# Patient Record
Sex: Female | Born: 1975 | Race: White | Hispanic: No | Marital: Married | Smoking: Former smoker
Health system: Southern US, Community
[De-identification: ages and names within clinical notes are randomized; demographics above are authoritative.]

## PROBLEM LIST (undated history)

## (undated) DIAGNOSIS — IMO0002 Reserved for concepts with insufficient information to code with codable children: Secondary | ICD-10-CM

## (undated) DIAGNOSIS — G43909 Migraine, unspecified, not intractable, without status migrainosus: Secondary | ICD-10-CM

## (undated) DIAGNOSIS — R51 Headache: Secondary | ICD-10-CM

## (undated) DIAGNOSIS — F411 Generalized anxiety disorder: Secondary | ICD-10-CM

## (undated) DIAGNOSIS — F32A Depression, unspecified: Secondary | ICD-10-CM

## (undated) DIAGNOSIS — D649 Anemia, unspecified: Secondary | ICD-10-CM

## (undated) DIAGNOSIS — R911 Solitary pulmonary nodule: Secondary | ICD-10-CM

## (undated) DIAGNOSIS — B009 Herpesviral infection, unspecified: Secondary | ICD-10-CM

## (undated) DIAGNOSIS — Z8249 Family history of ischemic heart disease and other diseases of the circulatory system: Secondary | ICD-10-CM

## (undated) DIAGNOSIS — I10 Essential (primary) hypertension: Secondary | ICD-10-CM

## (undated) DIAGNOSIS — R87619 Unspecified abnormal cytological findings in specimens from cervix uteri: Secondary | ICD-10-CM

## (undated) DIAGNOSIS — G4730 Sleep apnea, unspecified: Secondary | ICD-10-CM

## (undated) DIAGNOSIS — Z30013 Encounter for initial prescription of injectable contraceptive: Secondary | ICD-10-CM

## (undated) DIAGNOSIS — N946 Dysmenorrhea, unspecified: Secondary | ICD-10-CM

## (undated) DIAGNOSIS — R0609 Other forms of dyspnea: Secondary | ICD-10-CM

## (undated) DIAGNOSIS — K219 Gastro-esophageal reflux disease without esophagitis: Secondary | ICD-10-CM

## (undated) DIAGNOSIS — F329 Major depressive disorder, single episode, unspecified: Secondary | ICD-10-CM

## (undated) DIAGNOSIS — F419 Anxiety disorder, unspecified: Secondary | ICD-10-CM

## (undated) DIAGNOSIS — C801 Malignant (primary) neoplasm, unspecified: Secondary | ICD-10-CM

## (undated) HISTORY — DX: Unspecified abnormal cytological findings in specimens from cervix uteri: R87.619

## (undated) HISTORY — DX: Family history of ischemic heart disease and other diseases of the circulatory system: Z82.49

## (undated) HISTORY — DX: Encounter for initial prescription of injectable contraceptive: Z30.013

## (undated) HISTORY — DX: Migraine, unspecified, not intractable, without status migrainosus: G43.909

## (undated) HISTORY — DX: Anxiety disorder, unspecified: F41.9

## (undated) HISTORY — DX: Essential (primary) hypertension: I10

## (undated) HISTORY — DX: Reserved for concepts with insufficient information to code with codable children: IMO0002

## (undated) HISTORY — DX: Headache: R51

## (undated) HISTORY — DX: Dysmenorrhea, unspecified: N94.6

## (undated) HISTORY — PX: DILATION AND CURETTAGE OF UTERUS: SHX78

## (undated) HISTORY — DX: Depression, unspecified: F32.A

## (undated) HISTORY — DX: Solitary pulmonary nodule: R91.1

## (undated) HISTORY — DX: Herpesviral infection, unspecified: B00.9

## (undated) HISTORY — DX: Generalized anxiety disorder: F41.1

## (undated) HISTORY — DX: Major depressive disorder, single episode, unspecified: F32.9

## (undated) HISTORY — DX: Sleep apnea, unspecified: G47.30

## (undated) HISTORY — DX: Gastro-esophageal reflux disease without esophagitis: K21.9

## (undated) HISTORY — PX: TUBAL LIGATION: SHX77

## (undated) HISTORY — DX: Other forms of dyspnea: R06.09

---
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
## 2005-09-15 ENCOUNTER — Ambulatory Visit: Payer: Self-pay | Admitting: Gynecology

## 2005-09-23 ENCOUNTER — Ambulatory Visit: Payer: Self-pay | Admitting: Family Medicine

## 2005-09-23 ENCOUNTER — Inpatient Hospital Stay (HOSPITAL_COMMUNITY): Admission: AD | Admit: 2005-09-23 | Discharge: 2005-09-26 | Payer: Self-pay | Admitting: Obstetrics and Gynecology

## 2005-09-26 ENCOUNTER — Inpatient Hospital Stay (HOSPITAL_COMMUNITY): Admission: AD | Admit: 2005-09-26 | Discharge: 2005-09-26 | Payer: Self-pay | Admitting: Gynecology

## 2005-09-30 ENCOUNTER — Ambulatory Visit: Payer: Self-pay | Admitting: Family Medicine

## 2005-10-01 ENCOUNTER — Inpatient Hospital Stay (HOSPITAL_COMMUNITY): Admission: AD | Admit: 2005-10-01 | Discharge: 2005-10-01 | Payer: Self-pay | Admitting: Gynecology

## 2005-10-28 ENCOUNTER — Ambulatory Visit: Payer: Self-pay | Admitting: Family Medicine

## 2005-10-29 ENCOUNTER — Ambulatory Visit: Payer: Self-pay | Admitting: Family Medicine

## 2005-11-13 ENCOUNTER — Ambulatory Visit: Payer: Self-pay | Admitting: Obstetrics & Gynecology

## 2005-12-04 ENCOUNTER — Ambulatory Visit: Payer: Self-pay | Admitting: Obstetrics & Gynecology

## 2005-12-11 ENCOUNTER — Ambulatory Visit (HOSPITAL_COMMUNITY): Admission: RE | Admit: 2005-12-11 | Discharge: 2005-12-11 | Payer: Self-pay | Admitting: Family Medicine

## 2005-12-22 ENCOUNTER — Ambulatory Visit: Payer: Self-pay | Admitting: Gynecology

## 2006-01-12 ENCOUNTER — Ambulatory Visit: Payer: Self-pay | Admitting: Gynecology

## 2006-02-09 ENCOUNTER — Ambulatory Visit: Payer: Self-pay | Admitting: Gynecology

## 2006-02-23 ENCOUNTER — Ambulatory Visit: Payer: Self-pay | Admitting: Gynecology

## 2006-03-09 ENCOUNTER — Ambulatory Visit: Payer: Self-pay | Admitting: Gynecology

## 2006-03-23 ENCOUNTER — Ambulatory Visit: Payer: Self-pay | Admitting: Gynecology

## 2006-04-06 ENCOUNTER — Ambulatory Visit: Payer: Self-pay | Admitting: Gynecology

## 2006-04-20 ENCOUNTER — Ambulatory Visit: Payer: Self-pay | Admitting: Gynecology

## 2006-04-27 ENCOUNTER — Ambulatory Visit: Payer: Self-pay | Admitting: Gynecology

## 2006-05-02 ENCOUNTER — Inpatient Hospital Stay (HOSPITAL_COMMUNITY): Admission: AD | Admit: 2006-05-02 | Discharge: 2006-05-02 | Payer: Self-pay | Admitting: Obstetrics and Gynecology

## 2006-05-02 ENCOUNTER — Ambulatory Visit: Payer: Self-pay | Admitting: *Deleted

## 2006-05-05 ENCOUNTER — Ambulatory Visit: Payer: Self-pay | Admitting: Family Medicine

## 2006-05-07 ENCOUNTER — Ambulatory Visit: Payer: Self-pay | Admitting: Gynecology

## 2006-05-07 ENCOUNTER — Inpatient Hospital Stay (HOSPITAL_COMMUNITY): Admission: AD | Admit: 2006-05-07 | Discharge: 2006-05-10 | Payer: Self-pay | Admitting: Gynecology

## 2006-05-07 ENCOUNTER — Encounter (INDEPENDENT_AMBULATORY_CARE_PROVIDER_SITE_OTHER): Payer: Self-pay | Admitting: Specialist

## 2006-05-14 ENCOUNTER — Ambulatory Visit: Payer: Self-pay | Admitting: Obstetrics & Gynecology

## 2006-06-15 ENCOUNTER — Encounter (INDEPENDENT_AMBULATORY_CARE_PROVIDER_SITE_OTHER): Payer: Self-pay | Admitting: Gynecology

## 2006-06-15 ENCOUNTER — Ambulatory Visit: Payer: Self-pay | Admitting: Gynecology

## 2007-04-01 ENCOUNTER — Ambulatory Visit: Payer: Self-pay | Admitting: Gynecology

## 2007-06-12 ENCOUNTER — Emergency Department: Payer: Self-pay | Admitting: Emergency Medicine

## 2007-07-19 ENCOUNTER — Encounter (INDEPENDENT_AMBULATORY_CARE_PROVIDER_SITE_OTHER): Payer: Self-pay | Admitting: Gynecology

## 2007-07-19 ENCOUNTER — Ambulatory Visit: Payer: Self-pay | Admitting: Gynecology

## 2007-09-17 ENCOUNTER — Ambulatory Visit: Payer: Self-pay | Admitting: Unknown Physician Specialty

## 2007-12-06 ENCOUNTER — Ambulatory Visit: Payer: Self-pay | Admitting: Unknown Physician Specialty

## 2007-12-21 ENCOUNTER — Ambulatory Visit: Payer: Self-pay | Admitting: Unknown Physician Specialty

## 2008-01-07 ENCOUNTER — Ambulatory Visit: Payer: Self-pay | Admitting: Unknown Physician Specialty

## 2008-01-13 ENCOUNTER — Ambulatory Visit: Payer: Self-pay | Admitting: Unknown Physician Specialty

## 2008-01-19 ENCOUNTER — Ambulatory Visit: Payer: Self-pay | Admitting: Unknown Physician Specialty

## 2008-06-30 HISTORY — PX: COLPOSCOPY: SHX161

## 2008-07-10 ENCOUNTER — Ambulatory Visit: Payer: Self-pay | Admitting: Obstetrics & Gynecology

## 2009-03-01 ENCOUNTER — Ambulatory Visit: Payer: Self-pay | Admitting: Obstetrics & Gynecology

## 2009-03-01 ENCOUNTER — Encounter: Payer: Self-pay | Admitting: Obstetrics & Gynecology

## 2009-03-02 ENCOUNTER — Encounter: Payer: Self-pay | Admitting: Obstetrics & Gynecology

## 2009-03-02 LAB — CONVERTED CEMR LAB
Trich, Wet Prep: NONE SEEN
Yeast Wet Prep HPF POC: NONE SEEN

## 2009-03-27 ENCOUNTER — Ambulatory Visit: Payer: Self-pay | Admitting: Obstetrics & Gynecology

## 2009-04-12 ENCOUNTER — Ambulatory Visit: Payer: Self-pay | Admitting: Psychiatry

## 2009-04-26 ENCOUNTER — Ambulatory Visit: Payer: Self-pay | Admitting: Psychiatry

## 2009-08-21 ENCOUNTER — Ambulatory Visit: Payer: Self-pay | Admitting: Obstetrics & Gynecology

## 2009-11-05 ENCOUNTER — Ambulatory Visit: Payer: Self-pay | Admitting: Obstetrics and Gynecology

## 2009-11-05 LAB — CONVERTED CEMR LAB
Free T4: 0.83 ng/dL (ref 0.80–1.80)
HCT: 32.7 % — ABNORMAL LOW (ref 36.0–46.0)
Hemoglobin: 10.3 g/dL — ABNORMAL LOW (ref 12.0–15.0)
MCHC: 31.5 g/dL (ref 30.0–36.0)
MCV: 78 fL (ref 78.0–100.0)
Platelets: 305 10*3/uL (ref 150–400)
RBC: 4.19 M/uL (ref 3.87–5.11)
RDW: 16.8 % — ABNORMAL HIGH (ref 11.5–15.5)
T3, Free: 2.6 pg/mL (ref 2.3–4.2)
TSH: 1.876 microintl units/mL (ref 0.350–4.500)
WBC: 7.6 10*3/uL (ref 4.0–10.5)

## 2010-01-07 ENCOUNTER — Ambulatory Visit: Payer: Self-pay | Admitting: Obstetrics and Gynecology

## 2010-03-28 ENCOUNTER — Ambulatory Visit: Payer: Self-pay | Admitting: Obstetrics & Gynecology

## 2010-03-28 LAB — CONVERTED CEMR LAB
Prolactin: 7.8 ng/mL
TSH: 1.872 microintl units/mL (ref 0.350–4.500)

## 2010-03-29 ENCOUNTER — Encounter: Payer: Self-pay | Admitting: Obstetrics & Gynecology

## 2010-03-29 LAB — CONVERTED CEMR LAB
Clue Cells Wet Prep HPF POC: NONE SEEN
Trich, Wet Prep: NONE SEEN
WBC, Wet Prep HPF POC: NONE SEEN

## 2010-11-12 NOTE — Assessment & Plan Note (Signed)
NAME:  BETTA, BALLA NO.:  [id number]   MEDICAL RECORD NO.:  [id number]          PATIENT TYPE:  POB   LOCATION:  [facility] at [facility]         FACILITY:  [facility]   PHYSICIAN:  Scheryl Darter, MD       DATE OF BIRTH:  September 03, 1975   DATE OF SERVICE:                                  CLINIC NOTE   The patient returns today to discuss her depression medications.  The  patient has been treated for depression for years.  She currently sees a  counselor, but she was unable to initiate psychiatric therapy due to  problems with payment.  Dr. Marice Potter had changed her to Wellbutrin in  September.  She did not do well with Wellbutrin and she called in  October and prescription for Zoloft was given.  She is now on Zoloft 100  mg p.o. daily.  She still has problems with anxiety and had remained a  bit mild panic attack recently.  At that time, she took a friend's  Klonopin which helped.  She does have some benefit from the Zoloft,  however.  So, she feels about the same on Zoloft as she did when she was  taking Celexa.  She has trouble falling asleep.  She is going to try to  establish psychiatric care.  She has not been seeing her counselor  recently because of 30 dollar co-pay for each visit.  I asked her to  reestablish that relationship.  She cannot try to find a psychiatrist in  [place].  I recommend that we try a second agent for now and she will  continue with the Zoloft.  I gave her a prescription for amitriptyline  25 mg to be taken at bedtime.  She will return in 3 or 4 weeks to review  her progress on that medication.      Scheryl Darter, MD     JA/MEDQ  D:  08/21/2009  T:  08/22/2009  Job:  [id number]

## 2010-11-12 NOTE — Assessment & Plan Note (Signed)
NAME:  Catherine Gilbert, Catherine Gilbert NO.:  [id number]   MEDICAL RECORD NO.:  [id number]          PATIENT TYPE:  POB   LOCATION:  [facility] at [facility]         FACILITY:  [facility]   PHYSICIAN:  Allie Bossier, MD        DATE OF BIRTH:  11-30-1975   DATE OF SERVICE:                                  CLINIC NOTE   Catherine Gilbert is a 35 year old married white, gravida 5, para 3, abortus 2.  She  has children ages 27, 5 and 11.  She has 3 complaints today.  One, she  would like to change her Celexa to something more energizing.  She says  she has tried Prozac in the past and did not do well with that.  She  does say that she used Wellbutrin and Effexor in the past with good  results, and she would like to try the Wellbutrin now so that she can  also try to quit smoking.  Her other complaint is that of pain on the  first day of her menses.  She says that sometimes it is bad enough to  cause her to vomit and her third complaint is that of a vaginal  discharge and she would like to be checked for GC, Chlamydia and a wet  prep.   PAST MEDICAL HISTORY:  She has herpes, depression, hypertension and she  is a smoker.  For her hypertension, she sees Dr. Dossie Arbour.   PAST SURGICAL HISTORY:  A D and C for an elective AB at about 35 years  of age, a tubal ligation and 3 C-sections.   SOCIAL HISTORY:  She smokes a pack a day for the last 18 years.  She  denies alcohol or drug use.   ALLERGIES:  No known drug allergies.  No latex allergies.   FAMILY HISTORY:  Positive for postmenopausal breast cancer in a maternal  grandmother, but no GYN and colon cancers.   REVIEW OF SYSTEMS:  She is married.  Her last Pap smear was done in  January 2009, it was normal.  The remainder review of systems questions  are negative.   MEDICATIONS:  1. Valtrex as necessary.  2. Prevacid twice a day as necessary.  3. Atenolol 100 mg daily.  4. Celexa 40 mg daily.   PHYSICAL EXAMINATION:  VITAL SIGNS:  Weight 149, height 5  feet 5 inches,  blood pressure 151/106, pulse 74.  HEENT:  Normal.  HEART:  Regular rate and rhythm.  LUNGS:  Clear to auscultation bilaterally.  BREASTS:  Normal.  ABDOMEN:  No hepatosplenomegaly.  PELVIC:  External genitalia, she does have a small HSV lesions on the  right labia minora.  Cervix, nulliparous.  Normal discharge.  Wet prep  and GC and Chlamydia cultures were obtained.  Bimanual exam reveals a  small retroverted, minimally mobile, nontender uterus and nontender and  nonenlarged adnexa.   ASSESSMENT AND PLAN:  1. Annual exam, check the Pap smear.  Recommend self-breast and self-      vulvar exams monthly.  2. Hypertension.  I have recommended that she goes and see Dr.      Dossie Arbour as soon as possible.  We will make a referral.  3. The patient desire for cultures and wet prep, this were done.  4. Pain with her beginning of onset.  We will schedule an ultrasound      with the beginning of her next period, at which time she expects to      be experiencing her pain.  5. Antidepressant medicine.  We will change her to Wellbutrin 150 mg      b.i.d.  Hopefully, she will be able to quit smoking with this      regimen and also she will discontinue the Celexa after she start      the Wellbutrin.  I will have her back for followup on this in about      4 weeks.      Allie Bossier, MD     MCD/MEDQ  D:  03/01/2009  T:  03/02/2009  Job:  [id number]

## 2010-11-12 NOTE — Assessment & Plan Note (Signed)
NAME:  Catherine Gilbert, Catherine Gilbert NO.:  [id number]   MEDICAL RECORD NO.:  [id number]          PATIENT TYPE:  POB   LOCATION:  [facility]         FACILITY:  [facility]   PHYSICIAN:  Argentina Donovan, MD        DATE OF BIRTH:  [birth year]   DATE OF SERVICE:  11/05/2009                                  CLINIC NOTE   The patient is a 35 year old white female, gravida 5, para 3-0-2-3, who  has been recently placed on antidepressive medications.  She was on  Zoloft which seemed to be helping in a 100 mg a day, but she was still  having problems sleeping and still had a little bit of anxiety.  She was  placed 2 months ago on amitriptyline in addition to the Zoloft.  It has  helped to sleep a little bit, but she still takes 45 minutes to fall  asleep and that seems to be her biggest problem, but since then she put  on 14 pounds.  In looking at her past history, her weight has been very  stable for a long period of time, so my feeling is probably that weight  gain is secondary to the amitriptyline.  We are going to stop that and  just give her another with 5 mg of Ambien to take half hour before she  goes to bed at night.  As when she is asleep, it sounds as if she does  very well in her sleep process.  In addition to this, I am going to also  get a TSH and evaluate the thyroid, although I think that the weight  gain probably is more explained by her medication not anything else.  She has developed numbness in her middle and ring finger on her right  hand, but no pain.  No sign of radiculopathy that can be noted other  than that, so I am going to get her.  This is aggravated when she drives  a car, will have her get a neurology consult.  We will also going to  have her make an appointment at the [facility], as I think  that she probably needs some psychotherapy and they were incapable of  giving her here.  The problem with direct referral to a psychiatrist  here has been the  co-pay or the upfront money that was demanded even  though she has fairly good insurance.   IMPRESSION:  Depression, slight sleep disorder, and recent weight gain.  I will have her come back in a couple months just to see how well she is  doing after she sees the consults.           ______________________________  Argentina Donovan, MD     PR/MEDQ  D:  11/05/2009  T:  11/06/2009  Job:  [id number]

## 2010-11-15 NOTE — Discharge Summary (Signed)
NAME:  Catherine Gilbert, Catherine Gilbert                ACCOUNT NO.:  [id number]   MEDICAL RECORD NO.:  [id number]          PATIENT TYPE:  INP   LOCATION:  9305                          FACILITY:  [facility]   PHYSICIAN:  Angie B. Merlene Morse, MD  DATE OF BIRTH:  1976/06/14   DATE OF ADMISSION:  09/23/2005  DATE OF DISCHARGE:  09/26/2005                                 DISCHARGE SUMMARY   ADMISSION DIAGNOSES:  1.  Nausea, vomiting and diarrhea.  2.  Intrauterine pregnancy at 6-5/7 weeks.  3.  Dehydration.  4.  Elevated white blood cell count.   DISCHARGE DIAGNOSES:  1.  Nausea, vomiting and diarrhea.  2.  Intrauterine pregnancy at 6-5/7 weeks.  3.  Dehydration.  4.  Hyperglycemia.  5.  Anxiety.  6.  Hypertension.  The patient's blood pressure remained stable throughout      her hospital stay.  She was not started on any hypertensive medications.   HISTORY OF PRESENT ILLNESS:  The patient is a 35 year old, G4, P2-0-1-2 at 7  weeks approximate gestational age with a headache 2 weeks ago with fever x1  day with diarrhea, nausea and vomiting since then since a total of 2 weeks.  Her last p.o. intake was the day before admission.  She states that she had  hyperemesis with other two children and she does have a sick contact of her  mother.   MEDICATIONS:  1.  Atenolol.  2.  Hydrochlorothiazide.  3.  Effexor.  4.  Xanax, stopped once she found out she was pregnant 1-1/2 to 2 weeks ago.   PAST OBSTETRICAL HISTORY:  Scheduled C-section x2.   PAST GYNECOLOGICAL HISTORY:  Last Pap smear in August 2006.   PAST MEDICAL HISTORY:  1.  Anxiety.  2.  Migraine headaches.  3.  Hypertension.   PAST SURGICAL HISTORY:  C-section.   SOCIAL HISTORY:  Smokes one pack per day.  No ethanol or drugs.  Married and  lives with husband.   PHYSICAL EXAMINATION:  VITAL SIGNS:  Stable.  GENERAL:  Ill-appearing, white female in no acute distress, vomiting.  CHEST:  Clear to auscultation bilaterally.  ABDOMEN:  Soft,  nontender.  Positive bowel sounds.   HOSPITAL COURSE:  The patient was admitted and she was given IV fluid  hydration.  For her nausea and vomiting, she was treated with Zofran,  Phenergan and Reglan.  She was able to tolerate foods on the day before and  the day of discharge and felt like she was feeling better.  She was noted to  have hyperglycemia on her initial lab work when she came in.  She was  educated on a diabetic diet.  She had blood sugars drawn while in the  hospital 2 hours postprandial as well as fasting.  Her blood sugars were  within normal limits for a diabetic.  There were a couple of abnormal values  which the patient could explain secondary to drinking soda or eating a  sugary meal just before the blood sugar was drawn.   LABORATORY DATA AND X-RAY FINDINGS:  Urine culture with no growth.  Urine  drug screen with positive THC.  CBC with hemoglobin 11.3, hematocrit 32.0,  white count 12.1 and platelets 258 on March 28.  On March 27, her white  count was 18.0, hemoglobin 12.8, hematocrit 36.3, platelets 317 with a  normal differential.  UA on March 27, showed 500 glucose, otherwise negative  urine with specific gravity 1.020.  CMP within normal limits except for a  glucose of 192 and Alk phos of 38.  Hemoglobin A1c was 5.7.  H. pylori less  than 0.4.  TSH was 0.354.  FT3 2.9, FT4 1.30.  Vitamin B12 793.   Ultrasound done on March 27, showed crown rump length of 0.82 consistent  with 6-5/7 week yolk sac and decidual reaction having normal appearance.   DISPOSITION:  Discharged to home.   DIET:  Diabetic diet.   DISCHARGE MEDICATIONS:  1.  Zofran 8 mg p.o. t.i.d., #90.  2.  Zofran 8 mg sublingual t.i.d. p.r.n., #30.  3.  Phenergan 12.5 one to two p.o. q.4-6h., #30.  4.  Prenatal vitamins.  5.  Phenergan suppositories 25 mg one p.o. PR q.4-6h., #10.  6.  Continue Effexor.   SPECIAL INSTRUCTIONS:  The patient will need a 24-hour urine and baseline  PIH labs because  of her chronic hypertension as an outpatient.  She will  also need an early Glucola.  These tests were not done because of this  patient being acutely ill.  It was felt that she could better tolerate them  as an outpatient.           ______________________________  August Saucer. Merlene Morse, MD     ABC/MEDQ  D:  09/26/2005  T:  09/26/2005  Job:  [id number]   cc:   [facility]

## 2010-11-15 NOTE — Op Note (Signed)
NAME:  Catherine Gilbert, Catherine Gilbert NO.:  [id number]   MEDICAL RECORD NO.:  [id number]          PATIENT TYPE:  INP   LOCATION:                                FACILITY:  [facility]   PHYSICIAN:  Ginger Carne, MD  DATE OF BIRTH:  1975-12-26   DATE OF PROCEDURE:  05/07/2006  DATE OF DISCHARGE:                                 OPERATIVE REPORT   PREOPERATIVE DIAGNOSIS:  Repeat cesarean section, term pregnancy, and  request for sterilization.   POSTOPERATIVE DIAGNOSIS:  Repeat cesarean section, term pregnancy, request  for sterilization, term viable delivery of female infant.   PROCEDURE:  Repeat low transverse cesarean section and Pomeroy bilateral  tubal ligation.   SURGEON:  Ginger Carne, M.D.   ASSISTANTMichele Mcalpine D. Okey Dupre, M.D.   ANESTHESIA:  Spinal.   SPECIMEN:  Right and left portions of tube sent to pathology and cord  bloods.   ESTIMATED BLOOD LOSS:  500 mL.   COMPLICATIONS:  None immediate.   OPERATIVE FINDINGS:  Term infant female delivered in vertex presentation.  Apgars and weight per delivery room record, no gross abnormalities.  Baby  cried spontaneously at delivery.  Amniotic fluid was clear.  Placenta  complete, three-vessel cord, central insertion.  Uterus, tubes and ovaries  showed normal decidual changes of pregnancy.   OPERATIVE PROCEDURE:  The patient was prepped and draped in the usual  fashion and placed in the left lateral supine position.  Betadine solution  was used for antiseptic and the patient was catheterized prior to the  procedure.  After adequate spinal analgesia, a Pfannenstiel incision was  made and the abdomen opened.  The lower uterine segment was incised  transversely, the baby delivered, cord clamped and cut, and the infant given  to the pediatric staff after bulb suctioning.  The placenta was removed  manually.  The uterus inspected.  Closure of the uterine musculature in one  layer of 0 Vicryl running interlocking suture.   Bleeding points  hemostatically checked.  Blood clots removed.   A Pomeroy bilateral tubal ligation was performed by grasping either tube at  the isthmus ampullary junction, about 3 cm of tube were incorporated in two  separate 2-0 plain catgut suture ties, the tubes then cut above said knots,  tips cauterized, no active bleeding noted.   The uterus was returned to the abdomen.  Blood clots removed.  Bleeding  points hemostatically checked.  Closure of the fascia in one layer of 0  Vicryl running suture from either end to midline and 4-0 Prolene for  subcuticular closure.  Instrument and sponge counts were correct.  The  patient tolerated the procedure well and returned to the post anesthesia  recovery room in excellent condition.      Ginger Carne, MD  Electronically Signed     SHB/MEDQ  D:  05/07/2006  T:  05/07/2006  Job:  [id number]

## 2010-11-15 NOTE — Discharge Summary (Signed)
Catherine Gilbert                ACCOUNT NO.:  [id number]   MEDICAL RECORD NO.:  [id number]          PATIENT TYPE:  INP   LOCATION:                                FACILITY:  [facility]   PHYSICIAN:  Ginger Carne, MD  DATE OF BIRTH:  09-23-1975   DATE OF ADMISSION:  05/05/2006  DATE OF DISCHARGE:                                 DISCHARGE SUMMARY   REASON FOR HOSPITALIZATION:  Repeat Caesarian section.  Request for  permanent sterilization.   IN HOSPITAL PROCEDURES:  Repeat low transverse Caesarian section, Pomeroy  bilateral tubal ligation.   FINAL DIAGNOSES:  Repeat low transverse Caesarian section, Pomeroy bilateral  tubal ligation.   HOSPITAL COURSE:  This is a 35 year old multiparous Caucasian female who  underwent a term pregnancy, repeat low transverse Caesarian section and  Pomeroy bilateral tubal ligation.  The patient's estimated date of  confinement was 05/14/2006.  She was dated by an 18-week scan and last  menstrual period.  She has a history of chronic hypertension, herpes simplex  virus without active lesions during this pregnancy.  The patient's  postoperative course was uneventful.  She was afebrile, voided well.  Incision was dry, scant flow and calves without tenderness.  Postoperative  hemoglobin was 9.6, hematocrit 28.   The patient was discharged with routine instructions including continuing  preoperative medications Zoloft 50 mg every day and labetalol 100 mg twice a  day.  The patient was also prescribed Percocet 5/325 1-2 every 4-6 hours as  needed for pain.  Routine postoperative instructions including contacting  the office for temperature elevation about 100.4 degrees Fahrenheit,  increasing incisional pain, drainage, redness, abdominal pain, genitourinary  or gastrointestinal symptomatology or any other concerns were discussed with  the patient to contact the office.  Heavy vaginal bleeding was also an  indication.  She will return in 3-4 days for her  subcuticular suture to be  removed and in four weeks to have her postoperative visit and reassessment  of her hypertension.      Ginger Carne, MD  Electronically Signed     SHB/MEDQ  D:  05/10/2006  T:  05/10/2006  Job:  [id number]

## 2011-04-16 ENCOUNTER — Ambulatory Visit: Payer: Self-pay | Admitting: Family Medicine

## 2011-04-23 ENCOUNTER — Encounter: Payer: Self-pay | Admitting: Family Medicine

## 2011-04-23 ENCOUNTER — Ambulatory Visit (INDEPENDENT_AMBULATORY_CARE_PROVIDER_SITE_OTHER): Payer: BC Managed Care – PPO | Admitting: Family Medicine

## 2011-04-23 ENCOUNTER — Other Ambulatory Visit: Payer: Self-pay | Admitting: Family Medicine

## 2011-04-23 DIAGNOSIS — F419 Anxiety disorder, unspecified: Secondary | ICD-10-CM | POA: Insufficient documentation

## 2011-04-23 DIAGNOSIS — F329 Major depressive disorder, single episode, unspecified: Secondary | ICD-10-CM

## 2011-04-23 DIAGNOSIS — Z720 Tobacco use: Secondary | ICD-10-CM | POA: Insufficient documentation

## 2011-04-23 DIAGNOSIS — Z1272 Encounter for screening for malignant neoplasm of vagina: Secondary | ICD-10-CM

## 2011-04-23 DIAGNOSIS — I10 Essential (primary) hypertension: Secondary | ICD-10-CM

## 2011-04-23 DIAGNOSIS — F32A Depression, unspecified: Secondary | ICD-10-CM

## 2011-04-23 DIAGNOSIS — F411 Generalized anxiety disorder: Secondary | ICD-10-CM

## 2011-04-23 DIAGNOSIS — N920 Excessive and frequent menstruation with regular cycle: Secondary | ICD-10-CM

## 2011-04-23 DIAGNOSIS — F17200 Nicotine dependence, unspecified, uncomplicated: Secondary | ICD-10-CM

## 2011-04-23 DIAGNOSIS — Z01419 Encounter for gynecological examination (general) (routine) without abnormal findings: Secondary | ICD-10-CM

## 2011-04-23 DIAGNOSIS — R51 Headache: Secondary | ICD-10-CM

## 2011-04-23 HISTORY — DX: Generalized anxiety disorder: F41.1

## 2011-04-23 LAB — CBC
HCT: 37.7 % (ref 36.0–46.0)
Hemoglobin: 12.5 g/dL (ref 12.0–15.0)
MCH: 30.3 pg (ref 26.0–34.0)
MCHC: 33.2 g/dL (ref 30.0–36.0)
MCV: 91.3 fL (ref 78.0–100.0)
Platelets: 221 10*3/uL (ref 150–400)
RBC: 4.13 MIL/uL (ref 3.87–5.11)
RDW: 13.5 % (ref 11.5–15.5)
WBC: 9.4 10*3/uL (ref 4.0–10.5)

## 2011-04-23 NOTE — Patient Instructions (Addendum)
Smoking Cessation This document explains the best ways for you to quit smoking and new treatments to help. It lists new medicines that can double or triple your chances of quitting and quitting for good. It also considers ways to avoid relapses and concerns you may have about quitting, including weight gain. NICOTINE: A POWERFUL ADDICTION If you have tried to quit smoking, you know how hard it can be. It is hard because nicotine is a very addictive drug. For some people, it can be as addictive as heroin or cocaine. Usually, people make 2 or 3 tries, or more, before finally being able to quit. Each time you try to quit, you can learn about what helps and what hurts. Quitting takes hard work and a lot of effort, but you can quit smoking. QUITTING SMOKING IS ONE OF THE MOST IMPORTANT THINGS YOU WILL EVER DO.  You will live longer, feel better, and live better.   The impact on your body of quitting smoking is felt almost immediately:   Within 20 minutes, blood pressure decreases. Pulse returns to its normal level.   After 8 hours, carbon monoxide levels in the blood return to normal. Oxygen level increases.   After 24 hours, chance of heart attack starts to decrease. Breath, hair, and body stop smelling like smoke.   After 48 hours, damaged nerve endings begin to recover. Sense of taste and smell improve.   After 72 hours, the body is virtually free of nicotine. Bronchial tubes relax and breathing becomes easier.   After 2 to 12 weeks, lungs can hold more air. Exercise becomes easier and circulation improves.   Quitting will reduce your risk of having a heart attack, stroke, cancer, or lung disease:   After 1 year, the risk of coronary heart disease is cut in half.   After 5 years, the risk of stroke falls to the same as a nonsmoker.   After 10 years, the risk of lung cancer is cut in half and the risk of other cancers decreases significantly.   After 15 years, the risk of coronary heart  disease drops, usually to the level of a nonsmoker.   If you are pregnant, quitting smoking will improve your chances of having a healthy baby.   The people you live with, especially your children, will be healthier.   You will have extra money to spend on things other than cigarettes.  FIVE KEYS TO QUITTING Studies have shown that these 5 steps will help you quit smoking and quit for good. You have the best chances of quitting if you use them together: 1. Get ready.  2. Get support and encouragement.  3. Learn new skills and behaviors.  4. Get medicine to reduce your nicotine addiction and use it correctly.  5. Be prepared for relapse or difficult situations. Be determined to continue trying to quit, even if you do not succeed at first.  1. GET READY  Set a quit date.   Change your environment.   Get rid of ALL cigarettes, ashtrays, matches, and lighters in your home, car, and place of work.   Do not let people smoke in your home.   Review your past attempts to quit. Think about what worked and what did not.   Once you quit, do not smoke. NOT EVEN A PUFF!  2. GET SUPPORT AND ENCOURAGEMENT Studies have shown that you have a better chance of being successful if you have help. You can get support in many ways.  Tell   your family, friends, and coworkers that you are going to quit and need their support. Ask them not to smoke around you.   Talk to your caregivers (doctor, dentist, nurse, pharmacist, psychologist, and/or smoking counselor).   Get individual, group, or telephone counseling and support. The more counseling you have, the better your chances are of quitting. Programs are available at local hospitals and health centers. Call your local health department for information about programs in your area.   Spiritual beliefs and practices may help some smokers quit.   Quit meters are small computer programs online or downloadable that keep track of quit statistics, such as amount  of "quit-time," cigarettes not smoked, and money saved.   Many smokers find one or more of the many self-help books available useful in helping them quit and stay off tobacco.  3. LEARN NEW SKILLS AND BEHAVIORS  Try to distract yourself from urges to smoke. Talk to someone, go for a walk, or occupy your time with a task.   When you first try to quit, change your routine. Take a different route to work. Drink tea instead of coffee. Eat breakfast in a different place.   Do something to reduce your stress. Take a hot bath, exercise, or read a book.   Plan something enjoyable to do every day. Reward yourself for not smoking.   Explore interactive web-based programs that specialize in helping you quit.  4. GET MEDICINE AND USE IT CORRECTLY Medicines can help you stop smoking and decrease the urge to smoke. Combining medicine with the above behavioral methods and support can quadruple your chances of successfully quitting smoking. The [place] Food and Drug Administration (FDA) has approved 7 medicines to help you quit smoking. These medicines fall into 3 categories.  Nicotine replacement therapy (delivers nicotine to your body without the negative effects and risks of smoking):   Nicotine gum: Available over-the-counter.   Nicotine lozenges: Available over-the-counter.   Nicotine inhaler: Available by prescription.   Nicotine nasal spray: Available by prescription.   Nicotine skin patches (transdermal): Available by prescription and over-the-counter.   Antidepressant medicine (helps people abstain from smoking, but how this works is unknown):   Bupropion sustained-release (SR) tablets: Available by prescription.   Nicotinic receptor partial agonist (simulates the effect of nicotine in your brain):   Varenicline tartrate tablets: Available by prescription.   Ask your caregiver for advice about which medicines to use and how to use them. Carefully read the information on the package.    Everyone who is trying to quit may benefit from using a medicine. If you are pregnant or trying to become pregnant, nursing an infant, you are under age 18, or you smoke fewer than 10 cigarettes per day, talk to your caregiver before taking any nicotine replacement medicines.   You should stop using a nicotine replacement product and call your caregiver if you experience nausea, dizziness, weakness, vomiting, fast or irregular heartbeat, mouth problems with the lozenge or gum, or redness or swelling of the skin around the patch that does not go away.   Do not use any other product containing nicotine while using a nicotine replacement product.   Talk to your caregiver before using these products if you have diabetes, heart disease, asthma, stomach ulcers, you had a recent heart attack, you have high blood pressure that is not controlled with medicine, a history of irregular heartbeat, or you have been prescribed medicine to help you quit smoking.  5. BE PREPARED FOR RELAPSE OR   DIFFICULT SITUATIONS  Most relapses occur within the first 3 months after quitting. Do not be discouraged if you start smoking again. Remember, most people try several times before they finally quit.   You may have symptoms of withdrawal because your body is used to nicotine. You may crave cigarettes, be irritable, feel very hungry, cough often, get headaches, or have difficulty concentrating.   The withdrawal symptoms are only temporary. They are strongest when you first quit, but they will go away within 10 to 14 days.  Here are some difficult situations to watch for:  Alcohol. Avoid drinking alcohol. Drinking lowers your chances of successfully quitting.   Caffeine. Try to reduce the amount of caffeine you consume. It also lowers your chances of successfully quitting.   Other smokers. Being around smoking can make you want to smoke. Avoid smokers.   Weight gain. Many smokers will gain weight when they quit, usually  less than 10 pounds. Eat a healthy diet and stay active. Do not let weight gain distract you from your main goal, quitting smoking. Some medicines that help you quit smoking may also help delay weight gain. You can always lose the weight gained after you quit.   Bad mood or depression. There are a lot of ways to improve your mood other than smoking.  If you are having problems with any of these situations, talk to your caregiver. SPECIAL SITUATIONS AND CONDITIONS Studies suggest that everyone can quit smoking. Your situation or condition can give you a special reason to quit.  Pregnant women/new mothers: By quitting, you protect your baby's health and your own.   Hospitalized patients: By quitting, you reduce health problems and help healing.   Heart attack patients: By quitting, you reduce your risk of a second heart attack.   Lung, head, and neck cancer patients: By quitting, you reduce your chance of a second cancer.   Parents of children and adolescents: By quitting, you protect your children from illnesses caused by secondhand smoke.  QUESTIONS TO THINK ABOUT Think about the following questions before you try to stop smoking. You may want to talk about your answers with your caregiver.  Why do you want to quit?   If you tried to quit in the past, what helped and what did not?   What will be the most difficult situations for you after you quit? How will you plan to handle them?   Who can help you through the tough times? Your family? Friends? Caregiver?   What pleasures do you get from smoking? What ways can you still get pleasure if you quit?  Here are some questions to ask your caregiver:  How can you help me to be successful at quitting?   What medicine do you think would be best for me and how should I take it?   What should I do if I need more help?   What is smoking withdrawal like? How can I get information on withdrawal?  Quitting takes hard work and a lot of effort,  but you can quit smoking. FOR MORE INFORMATION  Smokefree.gov (http://www.davis-sullivan.com/) provides free, accurate, evidence-based information and professional assistance to help support the immediate and long-term needs of people trying to quit smoking. Document Released: 06/10/2001 Document Revised: 02/26/2011 Document Reviewed: 04/02/2009 [facility] Patient Information 2012 [place], [place].Preventative Care for Adults, Female A healthy lifestyle and preventative care can promote health and wellness. Preventative health guidelines for women include the following key practices:  A routine yearly physical is  a good way to check with your caregiver about your health and preventative screening. It is a chance to share any concerns and updates on your health, and to receive a thorough exam.   Visit your dentist for a routine exam and preventative care every 6 months. Brush your teeth twice a day and floss once a day. Good oral hygiene prevents tooth decay and gum disease.   The frequency of eye exams is based on your age, health, family medical history, use of contact lenses, and other factors. Follow your caregiver's recommendations for frequency of eye exams.   Eat a healthy diet. Foods like vegetables, fruits, whole grains, low-fat dairy products, and lean protein foods contain the nutrients you need without too many calories. Decrease your intake of foods high in solid fats, added sugars, and salt. Eat the right amount of calories for you.Get information about a proper diet from your caregiver, if necessary.   Regular physical exercise is one of the most important things you can do for your health. Most adults should get at least 150 minutes of moderate-intensity exercise (any activity that increases your heart rate and causes you to sweat) each week. In addition, most adults need muscle-strengthening exercises on 2 or more days a week.   Maintain a healthy weight. The body mass index (BMI) is a  screening tool to identify possible weight problems. It provides an estimate of body fat based on height and weight. Your caregiver can help determine your BMI, and can help you achieve or maintain a healthy weight.For adults 20 years and older:   A BMI below 18.5 is considered underweight.   A BMI of 18.5 to 24.9 is normal.   A BMI of 25 to 29.9 is considered overweight.   A BMI of 30 and above is considered obese.   Maintain normal blood lipids and cholesterol levels by exercising and minimizing your intake of saturated fat. Eat a balanced diet with plenty of fruit and vegetables. Blood tests for lipids and cholesterol should begin at age 10 and be repeated every 5 years. If your lipid or cholesterol levels are high, you are over 50, or you are a high risk for heart disease, you may need your cholesterol levels checked more frequently.Ongoing high lipid and cholesterol levels should be treated with medicines if diet and exercise are not effective.   If you smoke, find out from your caregiver how to quit. If you do not use tobacco, do not start.   If you are pregnant, do not drink alcohol. If you are breastfeeding, be very cautious about drinking alcohol. If you are not pregnant and choose to drink alcohol, do not exceed 1 drink per day. One drink is considered to be 12 ounces (355 mL) of beer, 5 ounces (148 mL) of wine, or 1.5 ounces (44 mL) of liquor.   Avoid use of street drugs. Do not share needles with anyone. Ask for help if you need support or instructions about stopping the use of drugs.   High blood pressure causes heart disease and increases the risk of stroke. Your blood pressure should be checked at least every 1 to 2 years. Ongoing high blood pressure should be treated with medicines if weight loss and exercise are not effective.   If you are 71 to 36 years old, ask your caregiver if you should take aspirin to prevent strokes.   Diabetes screening involves taking a blood sample  to check your fasting blood sugar level. This should  be done once every 3 years, after age 60, if you are within normal weight and without risk factors for diabetes. Testing should be considered at a younger age or be carried out more frequently if you are overweight and have at least 1 risk factor for diabetes.   Breast cancer screening is essential preventative care for women. You should practice "breast self-awareness." This means understanding the normal appearance and feel of your breasts and may include breast self-examination. Any changes detected, no matter how small, should be reported to a caregiver. Women in their 62s and 30s should have a clinical breast exam (CBE) by a caregiver as part of a regular health exam every 1 to 3 years. After age 29, women should have a CBE every year. Starting at age 66, women should consider having a mammogram (breast X-ray) every year. Women who have a family history of breast cancer should talk to their caregiver about genetic screening. Women at a high risk of breast cancer should talk to their caregiver about having an MRI and a mammogram every year.   The Pap test is a screening test for cervical cancer. A Pap test can show cell changes on the cervix that might become cervical cancer if left untreated. A Pap test is a procedure in which cells are obtained and examined from the lower end of the uterus (cervix).   Women should have a Pap test starting at age 16.   Between ages 65 and 58, Pap tests should be repeated every 2 years.   Beginning at age 29, you should have a Pap test every 3 years as long as the past 3 Pap tests have been normal.   Some women have medical problems that increase the chance of getting cervical cancer. Talk to your caregiver about these problems. It is especially important to talk to your caregiver if a new problem develops soon after your last Pap test. In these cases, your caregiver may recommend more frequent screening and Pap  tests.   The above recommendations are the same for women who have or have not gotten the vaccine for human papillomavirus (HPV).   If you had a hysterectomy for a problem that was not cancer or a condition that could lead to cancer, then you no longer need Pap tests. Even if you no longer need a Pap test, a regular exam is a good idea to make sure no other problems are starting.   If you are between ages 75 and 56, and you have had normal Pap tests going back 10 years, you no longer need Pap tests. Even if you no longer need a Pap test, a regular exam is a good idea to make sure no other problems are starting.   If you have had past treatment for cervical cancer or a condition that could lead to cancer, you need Pap tests and screening for cancer for at least 20 years after your treatment.   If Pap tests have been discontinued, risk factors (such as a new sexual partner) need to be reassessed to determine if screening should be resumed.   The HPV test is an additional test that may be used for cervical cancer screening. The HPV test looks for the virus that can cause the cell changes on the cervix. The cells collected during the Pap test can be tested for HPV. The HPV test could be used to screen women aged 57 years and older, and should be used in women of any age  who have unclear Pap test results. After the age of 52, women should have HPV testing at the same frequency as a Pap test.   Colorectal cancer can be detected and often prevented. Most routine colorectal cancer screening begins at the age of 38 and continues through age 36. However, your caregiver may recommend screening at an earlier age if you have risk factors for colon cancer. On a yearly basis, your caregiver may provide home test kits to check for hidden blood in the stool. Use of a small camera at the end of a tube, to directly examine the colon (sigmoidoscopy or colonoscopy), can detect the earliest forms of colorectal cancer. Talk  to your caregiver about this at age 44, when routine screening begins. Direct examination of the colon should be repeated every 5 to 10 years through age 91, unless early forms of pre-cancerous polyps or small growths are found.   Practice safe sex. Use condoms and avoid high-risk sexual practices to reduce the spread of sexually transmitted infections (STIs). STIs include gonorrhea, chlamydia, syphilis, trichomonas, herpes, HPV, and human immunodeficiency virus (HIV). Herpes, HIV, and HPV are viral illnesses that have no cure. They can result in disability, cancer, and death. Sexually active women aged 79 and younger should be checked for Chlamydia. Older women with new or multiple partners should also be tested for Chlamydia. Testing for other STIs is recommended if you are sexually active and at increased risk.   Osteoporosis is a disease in which the bones lose minerals and strength with aging. This can result in serious bone fractures. The risk of osteoporosis can be identified using a bone density scan. Women ages 69 and over and women at risk for fractures or osteoporosis should discuss screening with their caregivers. Ask your caregiver whether you should take a calcium supplement or vitamin D to reduce the rate of osteoporosis.   Menopause can be associated with physical symptoms and risks. Hormone replacement therapy is available to decrease symptoms and risks. You should talk to your caregiver about whether hormone replacement therapy is right for you.   Use sunscreen with skin protection factor (SPF) of 30 or more. Apply sunscreen liberally and repeatedly throughout the day. You should seek shade when your shadow is shorter than you. Protect yourself by wearing long sleeves, pants, a wide-brimmed hat, and sunglasses year round, whenever you are outdoors.   Once a month, do a whole body skin exam, using a mirror to look at the skin on your back. Notify your caregiver of new moles, moles that  have irregular borders, moles that are larger than a pencil eraser, or moles that have changed in shape or color.   Stay current with required immunizations.   Influenza. You need a dose every fall (or winter). The composition of the flu vaccine changes each year, so being vaccinated once is not enough.   Pneumococcal polysaccharide. You need 1 to 2 doses if you smoke cigarettes or if you have certain chronic medical conditions. You need 1 dose at age 23 (or older) if you have never been vaccinated.   Tetanus, diphtheria, pertussis (Tdap, Td). Get 1 dose of Tdap vaccine if you are younger than age 36 years, are over 70 and have contact with an infant, are a Research scientist (physical sciences), are pregnant, or simply want to be protected from whooping cough. After that, you need a Td booster dose every 10 years. Consult your caregiver if you have not had at least 3 tetanus and diphtheria-containing shots  sometime in your life or have a deep or dirty wound.   HPV. You need this vaccine if you are a woman age 25 years or younger. The vaccine is given in 3 doses over 6 months.   Measles, mumps, rubella (MMR). You need at least 1 dose of MMR if you were born in 1957 or later. You may also need a 2nd dose.   Meningococcal. If you are age 62 to 52 years and a Orthoptist living in a residence hall, or have one of several medical conditions, you need to get vaccinated against meningococcal disease. You may also need additional booster doses.   Zoster (shingles). If you are age 14 years or older, you should get this vaccine.   Varicella (chickenpox). If you have never had chickenpox or you were vaccinated but received only 1 dose, talk to your caregiver to find out if you need this vaccine.   Hepatitis A. You need this vaccine if you have a specific risk factor for hepatitis A virus infection or you simply wish to be protected from this disease. The vaccine is usually given as 2 doses, 6 to 18 months apart.    Hepatitis B. You need this vaccine if you have a specific risk factor for hepatitis B virus infection or you simply wish to be protected from this disease. The vaccine is given in 3 doses, usually over 6 months.  Preventative Services / Frequency Ages 1 to 5  Blood pressure check.** / Every 1 to 2 years.   Lipid and cholesterol check.**/ Every 5 years beginning at age 45.   Clinical breast exam.** / Every 3 years for women in their 35s and 30s.   Pap Test.** / Every 2 years from ages 41 through 66. Every 3 years starting at age 60 years through age 64 or 15 with a history of 3 consecutive normal Pap tests.   HPV Screening.** / Every 3 years from ages 59 through ages 27 to 93 with a history of 3 consecutive normal Pap tests.   Skin self-exam. / Monthly.   Influenza immunization.** / Every year.   Pneumococcal polysaccharide immunization.** / 1 to 2 doses if you smoke cigarettes or if you have certain chronic medical conditions.   Tetanus, diphtheria, pertussis (Tdap,Td) immunization. / A one-time dose of Tdap vaccine. After that, you need a Td booster dose every 10 years.   HPV immunization. / 3 doses over 6 months, if 26 and younger.   Measles, mumps, rubella (MMR) immunization. / You need at least 1 dose of MMR if you were born in 1957 or later. You may also need a 2nd dose.   Meningococcal immunization. / 1 dose if you are age 30 to 6 years and a Orthoptist living in a residence hall, or have one of several medical conditions, you need to get vaccinated against meningococcal disease. You may also need additional booster doses.   Varicella immunization. **/ Consult your caregiver.   Hepatitis A immunization. ** / Consult your caregiver. 2 doses, 6 to 18 months apart.   Hepatitis B immunization.** / Consult your caregiver. 3 doses usually over 6 months.  Ages 67 to 20  Blood pressure check.** / Every 1 to 2 years.   Lipid and cholesterol check.**/ Every 5  years beginning at age 46.   Clinical breast exam.** / Every year after age 97.   Mammogram.** / Every year beginning at age 101 and continuing for as long as you  are in good health. Consult with your caregiver.   Pap Test.** / Every 3 years starting at age 81 years through age 34 or 65 with a history of 3 consecutive normal Pap tests.   HPV Screening.** / Every 3 years from ages 31 through ages 27 to 71 with a history of 3 consecutive normal Pap tests.   Fecal occult blood test (FOBT) of stool. / Every year beginning at age 37 and continuing until age 29. You may not have to do this test if you get colonoscopy every 10 years.   Flexible sigmoidoscopy** or colonoscopy.** / Every 5 years for a flexible sigmoidoscopy or every 10 years for a colonoscopy beginning at age 50 and continuing until age 103.   Skin self-exam. / Monthly.   Influenza immunization.** / Every year.   Pneumococcal polysaccharide immunization.** / 1 to 2 doses if you smoke cigarettes or if you have certain chronic medical conditions.   Tetanus, diphtheria, pertussis (Tdap/Td) immunization.** / A one-time dose of Tdap vaccine. After that, you need a Td booster dose every 10 years.   Measles, mumps, rubella (MMR) immunization. / You need at least 1 dose of MMR if you were born in 1957 or later. You may also need a 2nd dose.   Varicella immunization. **/ Consult your caregiver.   Meningococcal immunization.** / Consult your caregiver.     Hepatitis A immunization. ** / Consult your caregiver. 2 doses, 6 to 18 months apart.   Hepatitis B immunization.** / Consult your caregiver. 3 doses, usually over 6 months.  Ages 1 and over  Blood pressure check.** / Every 1 to 2 years.   Lipid and cholesterol check.**/ Every 5 years beginning at age 64.   Clinical breast exam.** / Every year after age 78.   Mammogram.** / Every year beginning at age 43 and continuing for as long as you are in good health. Consult with your  caregiver.   Pap Test,** / Every 3 years starting at age 70 years through age 70 or 35 with a 3 consecutive normal Pap tests. Testing can be stopped between 65 and 70 with 3 consecutive normal Pap tests and no abnormal Pap or HPV tests in the past 10 years.   HPV Screening.** / Every 3 years from ages 71 through ages 79 or 75 with a history of 3 consecutive normal Pap tests. Testing can be stopped between 65 and 70 with 3 consecutive normal Pap tests and no abnormal Pap or HPV tests in the past 10 years.   Fecal occult blood test (FOBT) of stool. / Every year beginning at age 78 and continuing until age 55. You may not have to do this test if you get colonoscopy every 10 years.   Flexible sigmoidoscopy** or colonoscopy.** / Every 5 years for a flexible sigmoidoscopy or every 10 years for a colonoscopy beginning at age 88 and continuing until age 85.   Osteoporosis screening.** / A one-time screening for women ages 10 and over and women at risk for fractures or osteoporosis.   Skin self-exam. / Monthly.   Influenza immunization.** / Every year.   Pneumococcal polysaccharide immunization.** / 1 dose at age 25 (or older) if you have never been vaccinated.   Tetanus, diphtheria, pertussis (Tdap, Td) immunization. / A one-time dose of Tdap vaccine if you are over 65 and have contact with an infant, are a Research scientist (physical sciences), or simply want to be protected from whooping cough. After that, you need a Td booster  dose every 10 years.   Varicella immunization. **/ Consult your caregiver.   Meningococcal immunization.** / Consult your caregiver.   Hepatitis A immunization. ** / Consult your caregiver. 2 doses, 6 to 18 months apart.   Hepatitis B immunization.** / Check with your caregiver. 3 doses, usually over 6 months.  ** Family history and personal history of risk and conditions may change your caregiver's recommendations. Document Released: 08/12/2001 Document Revised: 02/26/2011 Document  Reviewed: 11/11/2010 [facility] Patient Information 2012 [place], [place].Menorrhagia Dysfunctional uterine bleeding is different from a normal menstrual period. When periods are heavy or there is more bleeding than is usual for you, it is called menorrhagia. It may be caused by hormonal imbalance, or physical, metabolic, or other problems. Examination is necessary in order that your caregiver may treat treatable causes. If this is a continuing problem, a D&C may be needed. That means that the cervix (the opening of the uterus or womb) is dilated (stretched larger) and the lining of the uterus is scraped out. The tissue scraped out is then examined under a microscope by a specialist (pathologist) to make sure there is nothing of concern that needs further or more extensive treatment. HOME CARE INSTRUCTIONS   If medications were prescribed, take exactly as directed. Do not change or switch medications without consulting your caregiver.   Long term heavy bleeding may result in iron deficiency. Your caregiver may have prescribed iron pills. They help replace the iron your body lost from heavy bleeding. Take exactly as directed. Iron may cause constipation. If this becomes a problem, increase the bran, fruits, and roughage in your diet.   Do not take aspirin or medicines that contain aspirin one week before or during your menstrual period. Aspirin may make the bleeding worse.   If you need to change your sanitary pad or tampon more than once every 2 hours, stay in bed and rest as much as possible until the bleeding stops.   Eat well-balanced meals. Eat foods high in iron. Examples are leafy green vegetables, meat, liver, eggs, and whole grain breads and cereals. Do not try to lose weight until the abnormal bleeding has stopped and your blood iron level is back to normal.  SEEK MEDICAL CARE IF:   You need to change your sanitary pad or tampon more than once an hour.   You develop nausea (feeling sick to  your stomach) and vomiting, dizziness, or diarrhea while you are taking your medicine.   You have any problems that may be related to the medicine you are taking.  SEEK IMMEDIATE MEDICAL CARE IF:   You have a fever.   You develop chills.   You develop severe bleeding or start to pass blood clots.   You feel dizzy or faint.  MAKE SURE YOU:   Understand these instructions.   Will watch your condition.   Will get help right away if you are not doing well or get worse.  Document Released: 06/16/2005 Document Revised: 02/26/2011 Document Reviewed: 02/04/2008 [facility] Patient Information 2012 [place], [place].

## 2011-04-23 NOTE — Progress Notes (Signed)
  Subjective:     Catherine Gilbert is a 35 y.o. female and is here for a comprehensive physical exam. The patient reports problems - abnl bleeding--monthly but lasts x 2 wks, increasing acne.  History   Social History  . Marital Status: Married    Spouse Name: N/A    Number of Children: N/A  . Years of Education: N/A   Occupational History  . Not on file.   Social History Main Topics  . Smoking status: Current Everyday Smoker -- 20 years    Types: Cigarettes  . Smokeless tobacco: Never Used   Comment: down to 1-2 cigarettes per day  . Alcohol Use: No  . Drug Use: No  . Sexually Active: Yes    Birth Control/ Protection: Surgical   Other Topics Concern  . Not on file   Social History Narrative  . No narrative on file   Health Maintenance  Topic Date Due  . Pap Smear  05/01/1993  . Tetanus/tdap  05/01/1994  . Influenza Vaccine  03/31/2011    The following portions of the patient's history were reviewed and updated as appropriate: allergies, current medications, past family history, past medical history, past social history, past surgical history and problem list.  Review of Systems Pertinent items are noted in HPI.   Objective:    BP 161/97  Pulse 60  Ht 5\' 4"  (1.626 m)  Wt 121 lb (54.885 kg)  BMI 20.77 kg/m2  LMP 04/10/2011 General appearance: alert, cooperative and appears stated age Head: Normocephalic, without obvious abnormality, atraumatic Eyes: sclera without icterus Neck: no adenopathy, supple, symmetrical, trachea midline and thyroid not enlarged, symmetric, no tenderness/mass/nodules Lungs: clear to auscultation bilaterally Breasts: normal appearance, no masses or tenderness Heart: regular rate and rhythm, S1, S2 normal, no murmur, click, rub or gallop Abdomen: soft, non-tender; bowel sounds normal; no masses,  no organomegaly Pelvic: cervix normal in appearance, external genitalia normal, no adnexal masses or tenderness, no cervical motion tenderness,  uterus normal size, shape, and consistency and vagina normal without discharge Extremities: extremities normal, atraumatic, no cyanosis or edema Pulses: 2+ and symmetric Skin: Skin color, texture, turgor normal. No rashes or lesions Lymph nodes: Cervical, supraclavicular, and axillary nodes normal. Neurologic: Grossly normal    Assessment:    Healthy female exam. HTN, Smoker, Abnl bleeding     Plan:    Smoking cessation, pap smear, blood work, TSH, pelvic sonogram. See After Visit Summary for Counseling Recommendations

## 2011-04-23 NOTE — Progress Notes (Signed)
Patient is here for routine exam.  She would also like blood work today, she is fasting.  She had lost down to 110 lbs but has gained back to 121, due to extremely stressful year.  Her mother passed from a massive stroke, aunt, close family friend passed away, as well as her dog and her cat.  She would like to have a refill of Klonipin if she can.  She was originally given rx by a psychiatrist but now his phone is disconnected. Dr. Omelia Blackwater in [place].

## 2011-04-24 ENCOUNTER — Telehealth: Payer: Self-pay | Admitting: *Deleted

## 2011-04-24 LAB — COMPREHENSIVE METABOLIC PANEL
ALT: 21 U/L (ref 0–35)
AST: 20 U/L (ref 0–37)
Albumin: 4.6 g/dL (ref 3.5–5.2)
Alkaline Phosphatase: 40 U/L (ref 39–117)
BUN: 10 mg/dL (ref 6–23)
CO2: 26 mEq/L (ref 19–32)
Calcium: 9.3 mg/dL (ref 8.4–10.5)
Chloride: 102 mEq/L (ref 96–112)
Creat: 0.76 mg/dL (ref 0.50–1.10)
Glucose, Bld: 63 mg/dL — ABNORMAL LOW (ref 70–99)
Potassium: 4.5 mEq/L (ref 3.5–5.3)
Sodium: 140 mEq/L (ref 135–145)
Total Bilirubin: 0.5 mg/dL (ref 0.3–1.2)
Total Protein: 6.5 g/dL (ref 6.0–8.3)

## 2011-04-24 LAB — LIPID PANEL
Cholesterol: 153 mg/dL (ref 0–200)
HDL: 47 mg/dL (ref >39)
LDL Cholesterol: 95 mg/dL (ref 0–99)
Total CHOL/HDL Ratio: 3.3 Ratio
Triglycerides: 53 mg/dL (ref <150)
VLDL: 11 mg/dL (ref 0–40)

## 2011-04-24 LAB — TSH: TSH: 1.41 u[IU]/mL (ref 0.350–4.500)

## 2011-04-24 MED ORDER — ALPRAZOLAM 0.5 MG PO TABS: 0.5000 mg | ORAL_TABLET | Freq: Every evening | ORAL | Status: AC | PRN

## 2011-04-24 NOTE — Telephone Encounter (Signed)
Patient is requesting a refill of her Xanax.  She has taken in the past and with all of the stress she is going through with the several deaths in her family she feels like she needs a refill.

## 2011-05-15 ENCOUNTER — Other Ambulatory Visit: Payer: BC Managed Care – PPO | Admitting: Family Medicine

## 2011-06-03 ENCOUNTER — Other Ambulatory Visit: Payer: BC Managed Care – PPO | Admitting: Physician Assistant

## 2011-08-27 ENCOUNTER — Ambulatory Visit (INDEPENDENT_AMBULATORY_CARE_PROVIDER_SITE_OTHER): Payer: BC Managed Care – PPO | Admitting: Obstetrics and Gynecology

## 2011-08-27 ENCOUNTER — Encounter: Payer: Self-pay | Admitting: Obstetrics and Gynecology

## 2011-08-27 VITALS — BP 125/84 | HR 75 | Ht 64.0 in | Wt 128.0 lb

## 2011-08-27 DIAGNOSIS — N946 Dysmenorrhea, unspecified: Secondary | ICD-10-CM

## 2011-08-27 HISTORY — DX: Dysmenorrhea, unspecified: N94.6

## 2011-08-27 MED ORDER — HYDROCODONE-IBUPROFEN 7.5-200 MG PO TABS: 1.0000 | ORAL_TABLET | Freq: Three times a day (TID) | ORAL | Status: AC | PRN

## 2011-08-27 NOTE — Progress Notes (Signed)
Patient presents today for the evaluation and management of dysmenorrhea. Patient reports a lifelong history of painful periods. Patient was seen in October with the same complaints and had a normal ultrasound and normal TSH at that time. Patient states that her period has not started yet but she is already experiencing severe cramps not relieved by 800 mg ibuprofen. Patient states that the pain is present a few days before her period and during the first 2 days of her cycles.  Medical management with birth control options discussed with the patient which included Mirena IUD and Depo-Provera. Patient is interested in Mirena IUD. Patient did not want to have IUD inserted today and wishes to return at the end of this upcoming period. Rx Vicoprofen provided. Patient to return for IUD insertion.

## 2011-08-27 NOTE — Patient Instructions (Signed)
Dysmenorrhea Menstrual pain is caused by the muscles of the uterus tightening (contracting) during a menstrual period. The muscles of the uterus contract due to the chemicals in the uterine lining. Primary dysmenorrhea is menstrual cramps that last a couple of days when you start having menstrual periods or soon after. This often begins after a teenager starts having her period. As a woman gets older or has a baby, the cramps will usually lesson or disappear. Secondary dysmenorrhea begins later in life, lasts longer, and the pain may be stronger than primary dysmenorrhea. The pain may start before the period and last a few days after the period. This type of dysmenorrhea is usually caused by an underlying problem such as:  The tissue lining the uterus grows outside of the uterus in other areas of the body (endometriosis).   The endometrial tissue, which normally lines the uterus, is found in or grows into the muscular walls of the uterus (adenomyosis).   The pelvic blood vessels are engorged with blood just before the menstrual period (pelvic congestive syndrome).   Overgrowth of cells in the lining of the uterus or cervix (polyps of the uterus or cervix).   Falling down of the uterus (prolapse) because of loose or stretched ligaments.   Depression.   Bladder problems, infection, or inflammation.   Problems with the intestine, a tumor, or irritable bowel syndrome.   Cancer of the female organs or bladder.   A severely tipped uterus.   A very tight opening or closed cervix.   Noncancerous tumors of the uterus (fibroids).   Pelvic inflammatory disease (PID).   Pelvic scarring (adhesions) from a previous surgery.   Ovarian cyst.   An intrauterine device (IUD) used for birth control.  CAUSES  The cause of menstrual pain is often unknown. SYMPTOMS   Cramping or throbbing pain in your lower abdomen.   Sometimes, a woman may also experience headaches.   Lower back pain.    Feeling sick to your stomach (nausea) or vomiting.   Diarrhea.   Sweating or dizziness.  DIAGNOSIS  A diagnosis is based on your history, symptoms, physical examination, diagnostic tests, or procedures. Diagnostic tests or procedures may include:  Blood tests.   An ultrasound.   An examination of the lining of the uterus (dilation and curettage, D&C).   An examination inside your abdomen or pelvis with a scope (laparoscopy).   X-rays.   CT Scan.   MRI.   An examination inside the bladder with a scope (cystoscopy).   An examination inside the intestine or stomach with a scope (colonoscopy, gastroscopy).  TREATMENT  Treatment depends on the cause of the dysmenorrhea. Treatment may include:  Pain medicine prescribed by your caregiver.   Birth control pills.   Hormone replacement therapy.   Nonsteroidal anti-inflammatory drugs (NSAIDs). These may help stop the production of prostaglandins.   An IUD with progesterone hormone in it.   Acupuncture.   Surgery to remove adhesions, endometriosis, ovarian cyst, or fibroids.   Removal of the uterus (hysterectomy).   Progesterone shots to stop the menstrual period.   Cutting the nerves on the sacrum that go to the female organs (presacral neurectomy).   Electric currant to the sacral nerves (sacral nerve stimulation).   Antidepressant medicine.   Psychiatric therapy, counseling, or group therapy.   Exercise and physical therapy.   Meditation and yoga therapy.  HOME CARE INSTRUCTIONS   Only take over-the-counter or prescription medicines for pain, discomfort, or fever as directed by your   caregiver.   Place a heating pad or hot water bottle on your lower back or abdomen. Do not sleep with the heating pad.   Use aerobic exercises, walking, swimming, biking, and other exercises to help lessen the cramping.   Massage to the lower back or abdomen may help.   Stop smoking.   Avoid alcohol and caffeine.   Yoga,  meditation, or acupuncture may help.  SEEK MEDICAL CARE IF:   The pain does not get better with medicine.   You have pain with sexual intercourse.  SEEK IMMEDIATE MEDICAL CARE IF:   Your pain increases and is not controlled with medicines.   You have a fever.   You develop nausea or vomiting with your period not controlled with medicine.   You have abnormal vaginal bleeding with your period.   You pass out.  MAKE SURE YOU:   Understand these instructions.   Will watch your condition.   Will get help right away if you are not doing well or get worse.  Document Released: 06/16/2005 Document Revised: 02/26/2011 Document Reviewed: 10/02/2008 [employer] Patient Information 2012 [employer]. 

## 2011-09-23 ENCOUNTER — Ambulatory Visit: Payer: BC Managed Care – PPO | Admitting: Obstetrics and Gynecology

## 2011-09-24 ENCOUNTER — Ambulatory Visit (INDEPENDENT_AMBULATORY_CARE_PROVIDER_SITE_OTHER): Payer: BC Managed Care – PPO | Admitting: Obstetrics & Gynecology

## 2011-09-24 ENCOUNTER — Encounter: Payer: Self-pay | Admitting: Obstetrics & Gynecology

## 2011-09-24 VITALS — BP 148/85 | HR 65 | Ht 64.0 in | Wt 127.0 lb

## 2011-09-24 DIAGNOSIS — Z30013 Encounter for initial prescription of injectable contraceptive: Secondary | ICD-10-CM | POA: Insufficient documentation

## 2011-09-24 DIAGNOSIS — N882 Stricture and stenosis of cervix uteri: Secondary | ICD-10-CM | POA: Insufficient documentation

## 2011-09-24 DIAGNOSIS — Z3049 Encounter for surveillance of other contraceptives: Secondary | ICD-10-CM

## 2011-09-24 DIAGNOSIS — Z30430 Encounter for insertion of intrauterine contraceptive device: Secondary | ICD-10-CM

## 2011-09-24 DIAGNOSIS — F419 Anxiety disorder, unspecified: Secondary | ICD-10-CM

## 2011-09-24 DIAGNOSIS — Z01812 Encounter for preprocedural laboratory examination: Secondary | ICD-10-CM

## 2011-09-24 HISTORY — DX: Encounter for initial prescription of injectable contraceptive: Z30.013

## 2011-09-24 LAB — POCT URINE PREGNANCY: Preg Test, Ur: NEGATIVE

## 2011-09-24 MED ORDER — ALPRAZOLAM 0.5 MG PO TABS: 0.5000 mg | ORAL_TABLET | Freq: Every evening | ORAL | Status: DC | PRN

## 2011-09-24 MED ORDER — MEDROXYPROGESTERONE ACETATE 150 MG/ML IM SUSP
150.0000 mg | INTRAMUSCULAR | Status: DC
  Administered 2011-09-24 – 2011-12-24 (×2): 150 mg via INTRAMUSCULAR

## 2011-09-24 MED ORDER — LEVONORGESTREL 20 MCG/24HR IU IUD: 1.0000 | INTRAUTERINE_SYSTEM | Freq: Once | INTRAUTERINE | Status: DC

## 2011-09-24 NOTE — Progress Notes (Signed)
IUD Insertion Attempt Procedure Note Patient identified, informed consent performed.  Discussed risks of malpositioning or placement of the IUD outside the uterus which may require further procedures. Time out was performed.  Urine pregnancy test negative.  Speculum placed in the vagina.  Cervix visualized and cleaned with Betadine x 2.  Grasped anteriorly with a single tooth tenaculum.  An attempt was made the uterus but the sound could not advance past 3 cm due to a stricture/steniosis of the internal os.  The plastic yellow dilator was used but was unable to breach this stricture and the patient was very uncomfortable, was crying and demanded we abort the procedure. Tenaculum was removed, small amount of bleeding noted form the tenaculum site.  Patient opted for Depo Provera injections for treatment of her dysmenorrhea and for contraception. Risks/benefits reviewed.  Will get first injection today, return in 3 months.   Last pap was 04/23/11, normal. Up to date with preventative health maintenance. Xanax refilled for patient for her anxiety.  Jaynie Collins, M.D. 09/24/2011 10:58 AM

## 2011-09-24 NOTE — Patient Instructions (Addendum)

## 2011-10-27 ENCOUNTER — Telehealth: Payer: Self-pay | Admitting: *Deleted

## 2011-10-27 NOTE — Telephone Encounter (Signed)
Patient is requesting refill of Vicoprofen for cramps with period.  She has been bleeding or spotting since 09/25/2011, which was when her cycle was due and she had an attempted iud insert that would not go in.  She bled heavy for a week with heavy spotting until now she has started bleeding and cramping again.  She only uses when the cramps are bad.

## 2011-10-28 ENCOUNTER — Other Ambulatory Visit: Payer: Self-pay | Admitting: Obstetrics and Gynecology

## 2011-10-28 ENCOUNTER — Other Ambulatory Visit: Payer: Self-pay | Admitting: *Deleted

## 2011-10-28 MED ORDER — HYDROCODONE-IBUPROFEN 7.5-200 MG PO TABS: 1.0000 | ORAL_TABLET | Freq: Four times a day (QID) | ORAL | Status: DC | PRN

## 2011-10-28 NOTE — Telephone Encounter (Signed)
Dr. Jolayne Panther has authorized a refill of Vicoprofen

## 2011-10-31 ENCOUNTER — Telehealth: Payer: Self-pay | Admitting: *Deleted

## 2011-10-31 DIAGNOSIS — A609 Anogenital herpesviral infection, unspecified: Secondary | ICD-10-CM

## 2011-10-31 MED ORDER — VALACYCLOVIR HCL 500 MG PO TABS: 1000.0000 mg | ORAL_TABLET | Freq: Every day | ORAL | Status: DC

## 2011-10-31 NOTE — Telephone Encounter (Signed)
Patient is having an outbreak of hsv and her refills have expired.

## 2011-12-24 ENCOUNTER — Ambulatory Visit (INDEPENDENT_AMBULATORY_CARE_PROVIDER_SITE_OTHER): Payer: BC Managed Care – PPO | Admitting: *Deleted

## 2011-12-24 DIAGNOSIS — N882 Stricture and stenosis of cervix uteri: Secondary | ICD-10-CM

## 2011-12-24 DIAGNOSIS — Z3049 Encounter for surveillance of other contraceptives: Secondary | ICD-10-CM

## 2011-12-24 DIAGNOSIS — Z309 Encounter for contraceptive management, unspecified: Secondary | ICD-10-CM

## 2011-12-24 MED ORDER — MEDROXYPROGESTERONE ACETATE 150 MG/ML IM SUSP: 150.0000 mg | INTRAMUSCULAR | Status: DC

## 2011-12-24 NOTE — Progress Notes (Signed)
Patient is here today for Depo Provera injection, she has been notified that from now on it will be called in as a prescription to be picked up and brought to the office for injection.

## 2012-01-20 ENCOUNTER — Telehealth: Payer: Self-pay | Admitting: *Deleted

## 2012-01-20 DIAGNOSIS — N390 Urinary tract infection, site not specified: Secondary | ICD-10-CM

## 2012-01-20 MED ORDER — SULFAMETHOXAZOLE-TRIMETHOPRIM 800-160 MG PO TABS: 1.0000 | ORAL_TABLET | Freq: Two times a day (BID) | ORAL | Status: DC

## 2012-01-20 NOTE — Telephone Encounter (Signed)
Pain is having pain and burning with urination, she feels as if it is a urinary tract infection and would like to have something called in for her.

## 2012-02-11 ENCOUNTER — Telehealth: Payer: Self-pay

## 2012-03-04 ENCOUNTER — Telehealth: Payer: Self-pay | Admitting: *Deleted

## 2012-03-04 NOTE — Telephone Encounter (Signed)
One refill given for patient to get her by until her appointment with primary care.

## 2012-03-25 ENCOUNTER — Ambulatory Visit: Payer: BC Managed Care – PPO | Admitting: *Deleted

## 2012-03-25 DIAGNOSIS — Z3042 Encounter for surveillance of injectable contraceptive: Secondary | ICD-10-CM

## 2012-03-25 MED ORDER — MEDROXYPROGESTERONE ACETATE 150 MG/ML IM SUSP
150.0000 mg | INTRAMUSCULAR | Status: DC
  Administered 2012-03-25: 150 mg via INTRAMUSCULAR

## 2012-03-29 ENCOUNTER — Ambulatory Visit: Payer: BC Managed Care – PPO

## 2012-04-21 ENCOUNTER — Ambulatory Visit: Payer: BC Managed Care – PPO | Admitting: Family Medicine

## 2012-06-17 ENCOUNTER — Ambulatory Visit (INDEPENDENT_AMBULATORY_CARE_PROVIDER_SITE_OTHER): Payer: BC Managed Care – PPO | Admitting: *Deleted

## 2012-06-17 DIAGNOSIS — Z3042 Encounter for surveillance of injectable contraceptive: Secondary | ICD-10-CM

## 2012-06-17 DIAGNOSIS — Z3049 Encounter for surveillance of other contraceptives: Secondary | ICD-10-CM

## 2012-06-17 DIAGNOSIS — N390 Urinary tract infection, site not specified: Secondary | ICD-10-CM

## 2012-06-17 LAB — POCT URINALYSIS DIPSTICK
Bilirubin, UA: NEGATIVE
Glucose, UA: NEGATIVE
Ketones, UA: NEGATIVE
Nitrite, UA: NEGATIVE
Protein, UA: NEGATIVE
Spec Grav, UA: 1.015
Urobilinogen, UA: NEGATIVE
pH, UA: 6

## 2012-06-17 MED ORDER — NITROFURANTOIN MONOHYD MACRO 100 MG PO CAPS: 100.0000 mg | ORAL_CAPSULE | Freq: Two times a day (BID) | ORAL | Status: DC

## 2012-06-17 MED ORDER — MEDROXYPROGESTERONE ACETATE 150 MG/ML IM SUSP
150.0000 mg | Freq: Once | INTRAMUSCULAR | Status: AC
  Administered 2012-06-17: 150 mg via INTRAMUSCULAR

## 2012-06-17 MED ORDER — PHENAZOPYRIDINE HCL 200 MG PO TABS: 200.0000 mg | ORAL_TABLET | Freq: Three times a day (TID) | ORAL | Status: DC | PRN

## 2012-06-20 LAB — URINE CULTURE: Colony Count: >=100000

## 2012-06-22 MED ORDER — CIPROFLOXACIN HCL 500 MG PO TABS: 500.0000 mg | ORAL_TABLET | Freq: Two times a day (BID) | ORAL | Status: DC

## 2012-06-22 NOTE — Addendum Note (Signed)
Addended by: Jaynie Collins A on: 06/22/2012 08:55 PM   Modules accepted: Orders

## 2012-06-22 NOTE — Progress Notes (Signed)
Urine culture showed E.coli, Ciprofloxacin prescribed.

## 2012-07-14 ENCOUNTER — Ambulatory Visit: Payer: BC Managed Care – PPO | Admitting: Obstetrics and Gynecology

## 2012-07-14 ENCOUNTER — Ambulatory Visit: Payer: BC Managed Care – PPO | Admitting: Family Medicine

## 2012-09-08 ENCOUNTER — Other Ambulatory Visit: Payer: Self-pay | Admitting: *Deleted

## 2012-09-08 MED ORDER — ATENOLOL 50 MG PO TABS: 50.0000 mg | ORAL_TABLET | Freq: Every day | ORAL | Status: DC

## 2012-09-08 NOTE — Progress Notes (Signed)
Pt notified by Darl Pikes that her Rx refill for atenolol has been sent to pharmacy

## 2012-09-15 ENCOUNTER — Ambulatory Visit (INDEPENDENT_AMBULATORY_CARE_PROVIDER_SITE_OTHER): Payer: BC Managed Care – PPO | Admitting: Obstetrics and Gynecology

## 2012-09-15 ENCOUNTER — Encounter: Payer: Self-pay | Admitting: Family Medicine

## 2012-09-15 ENCOUNTER — Encounter: Payer: Self-pay | Admitting: Obstetrics and Gynecology

## 2012-09-15 ENCOUNTER — Ambulatory Visit (INDEPENDENT_AMBULATORY_CARE_PROVIDER_SITE_OTHER): Payer: BC Managed Care – PPO | Admitting: Family Medicine

## 2012-09-15 VITALS — BP 132/89 | HR 65 | Ht 64.0 in | Wt 135.0 lb

## 2012-09-15 VITALS — BP 150/100 | HR 60 | Temp 97.6°F | Ht 65.0 in | Wt 133.0 lb

## 2012-09-15 DIAGNOSIS — F419 Anxiety disorder, unspecified: Secondary | ICD-10-CM

## 2012-09-15 DIAGNOSIS — I10 Essential (primary) hypertension: Secondary | ICD-10-CM | POA: Insufficient documentation

## 2012-09-15 DIAGNOSIS — IMO0001 Reserved for inherently not codable concepts without codable children: Secondary | ICD-10-CM

## 2012-09-15 DIAGNOSIS — F411 Generalized anxiety disorder: Secondary | ICD-10-CM

## 2012-09-15 DIAGNOSIS — F17200 Nicotine dependence, unspecified, uncomplicated: Secondary | ICD-10-CM

## 2012-09-15 DIAGNOSIS — Z720 Tobacco use: Secondary | ICD-10-CM

## 2012-09-15 DIAGNOSIS — Z136 Encounter for screening for cardiovascular disorders: Secondary | ICD-10-CM

## 2012-09-15 DIAGNOSIS — Z124 Encounter for screening for malignant neoplasm of cervix: Secondary | ICD-10-CM

## 2012-09-15 DIAGNOSIS — Z01419 Encounter for gynecological examination (general) (routine) without abnormal findings: Secondary | ICD-10-CM

## 2012-09-15 HISTORY — DX: Essential (primary) hypertension: I10

## 2012-09-15 LAB — LIPID PANEL
Cholesterol: 141 mg/dL (ref 0–200)
HDL: 31.1 mg/dL — ABNORMAL LOW (ref >39.00)
LDL Cholesterol: 103 mg/dL — ABNORMAL HIGH (ref 0–99)
Total CHOL/HDL Ratio: 5
Triglycerides: 35 mg/dL (ref 0.0–149.0)
VLDL: 7 mg/dL (ref 0.0–40.0)

## 2012-09-15 LAB — COMPREHENSIVE METABOLIC PANEL
ALT: 29 U/L (ref 0–35)
AST: 22 U/L (ref 0–37)
Albumin: 4.5 g/dL (ref 3.5–5.2)
Alkaline Phosphatase: 45 U/L (ref 39–117)
BUN: 12 mg/dL (ref 6–23)
CO2: 26 mEq/L (ref 19–32)
Calcium: 9.4 mg/dL (ref 8.4–10.5)
Chloride: 108 mEq/L (ref 96–112)
Creatinine, Ser: 0.8 mg/dL (ref 0.4–1.2)
GFR: 80.92 mL/min (ref >60.00)
Glucose, Bld: 96 mg/dL (ref 70–99)
Potassium: 4 mEq/L (ref 3.5–5.1)
Sodium: 140 mEq/L (ref 135–145)
Total Bilirubin: 0.6 mg/dL (ref 0.3–1.2)
Total Protein: 7.2 g/dL (ref 6.0–8.3)

## 2012-09-15 LAB — TSH: TSH: 0.85 u[IU]/mL (ref 0.35–5.50)

## 2012-09-15 MED ORDER — ALPRAZOLAM 0.5 MG PO TABS: 0.5000 mg | ORAL_TABLET | Freq: Every evening | ORAL | Status: DC | PRN

## 2012-09-15 MED ORDER — LISINOPRIL-HYDROCHLOROTHIAZIDE 10-12.5 MG PO TABS
1.0000 | ORAL_TABLET | Freq: Every day | ORAL | Status: DC
Start: 2012-09-15 — End: 2012-09-20

## 2012-09-15 NOTE — Progress Notes (Signed)
Here today for yearly gyn physical, no complaints.

## 2012-09-15 NOTE — Progress Notes (Signed)
  Subjective:     Catherine Gilbert is a 37 y.o. female [phone] with depo-provera induced amenorrhea who is here for a comprehensive physical exam. The patient reports no problems.  History   Social History  . Marital Status: Married    Spouse Name: N/A    Number of Children: N/A  . Years of Education: N/A   Occupational History  . Not on file.   Social History Main Topics  . Smoking status: Current Every Day Smoker -- 20 years    Types: Cigarettes  . Smokeless tobacco: Never Used     Comment: down to 1-2 cigarettes per day  . Alcohol Use: No  . Drug Use: No  . Sexually Active: Yes    Birth Control/ Protection: Surgical     Comment: tubalization   Other Topics Concern  . Not on file   Social History Narrative  . No narrative on file   Health Maintenance  Topic Date Due  . Influenza Vaccine  02/29/1976  . Tetanus/tdap  05/01/1994  . Pap Smear  04/22/2014       Review of Systems A comprehensive review of systems was negative.   Objective:      GENERAL: Well-developed, well-nourished female in no acute distress.  HEENT: Normocephalic, atraumatic. Sclerae anicteric.  NECK: Supple. Normal thyroid.  LUNGS: Clear to auscultation bilaterally.  HEART: Regular rate and rhythm. BREASTS: Symmetric in size. No palpable masses or lymphadenopathy, skin changes, or nipple drainage. ABDOMEN: Soft, nontender, nondistended. No organomegaly. PELVIC: Normal external female genitalia. Vagina is pink and rugated.  Normal discharge. Normal appearing cervix. Uterus is normal in size. No adnexal mass or tenderness. EXTREMITIES: No cyanosis, clubbing, or edema, 2+ distal pulses.    Assessment:    Healthy female exam.      Plan:    Pap smear performed Patient advised to continue self breast and vulva exam Patient will be contacted with any abnormal results RTC in 1 year or prn See After Visit Summary for Counseling Recommendations

## 2012-09-15 NOTE — Patient Instructions (Addendum)
Nice to meet you and your adorable family! Please STOP taking your atenolol and start taking lisinopril-HCTZ. Please check you blood pressure daily for next two weeks and call me with your readings.  We will call you about your lab results.  Check with your insurance company about Chantix.

## 2012-09-15 NOTE — Progress Notes (Signed)
Subjective:    Patient ID: Catherine Gilbert, female    DOB: February 17, 1976, 37 y.o.   MRN: [id number]  HPI  Very pleasant G5P3 here to establish care.  HTN- has been treated for HTN since she was in her early 57s.  Has never been on anything other than atenolol.   BP has been very elevated lately. Just went to OBGYN across the street and BP was ok.  Has blood pressure machine at home and her BP has been running in 160s/90s at home.  Mom died of a massive stroke two years ago. Denies any HA, blurred vision, CP or SOB.  BP Readings from Last 3 Encounters:  09/15/12 150/100  09/15/12 132/89  09/24/11 148/85   She is s/p BTL.  Anxiety- h/o anxiety and depression in past.  Weaned herself off of several SSRIs as she feels she no longer needs them.  Takes very occasional xanax- maybe once a week when she cannot sleep or feels very anxious. Overall, feels her symptoms are under good control.  Tobacco abuse- has smoked for 20 years.  Quit twice- cold [place], once for over a year.  She has thought about quitting again recently for her kids.  She also says she feels better and food tastes better when she is not smoking.  Patient Active Problem List  Diagnosis  . Tobacco user  . Anxiety state, unspecified  . Dysmenorrhea  . Stricture and stenosis of cervix  . Initiation of Depo Provera  . HTN (hypertension)   Past Medical History  Diagnosis Date  . Hypertension   . Depression   . Anxiety   . HSV (herpes simplex virus) infection   . Abnormal Pap smear    Past Surgical History  Procedure Laterality Date  . Cesarean section      x 3   . Tubal ligation    . Dilation and curettage of uterus    . Colposcopy  2010   History  Substance Use Topics  . Smoking status: Current Every Day Smoker -- 20 years    Types: Cigarettes  . Smokeless tobacco: Never Used     Comment: down to 1-2 cigarettes per day  . Alcohol Use: No   Family History  Problem Relation Age of Onset  . Stroke Mother 73     massive stroke  . Heart disease Father 61    congestive heart failure  . Hypertension Father   . Cancer Maternal Grandmother     breast cancer   No Known Allergies Current Outpatient Prescriptions on File Prior to Visit  Medication Sig Dispense Refill  . medroxyPROGESTERone (DEPO-PROVERA) 150 MG/ML injection       . omeprazole (PRILOSEC) 20 MG capsule Take 1 tablet by mouth Daily.      . valACYclovir (VALTREX) 500 MG tablet Take 2 tablets (1,000 mg total) by mouth daily. Take 2 tablets by mouth Twice daily as needed.  60 tablet  11   No current facility-administered medications on file prior to visit.   The PMH, PSH, Social History, Family History, Medications, and allergies have been reviewed in [facility], and have been updated if relevant.   Review of Systems See HPI     Objective:   Physical Exam BP 150/100  Pulse 60  Temp(Src) 97.6 F (36.4 C)  Ht 5\' 5"  (1.651 m)  Wt 133 lb (60.328 kg)  BMI 22.13 kg/m2  General:  Well-developed,well-nourished,in no acute distress; alert,appropriate and cooperative throughout examination Head:  normocephalic and atraumatic.  Eyes:  vision grossly intact, pupils equal, pupils round, and pupils reactive to light.   Ears:  R ear normal and L ear normal.   Nose:  no external deformity.   Mouth:  good dentition.   Neck:  No deformities, masses, or tenderness noted. Lungs:  Normal respiratory effort, chest expands symmetrically. Lungs are clear to auscultation, no crackles or wheezes. Heart:  Normal rate and regular rhythm. S1 and S2 normal without gallop, murmur, click, rub or other extra sounds. Abdomen:  Bowel sounds positive,abdomen soft and non-tender without masses, organomegaly or hernias noted. Msk:  No deformity or scoliosis noted of thoracic or lumbar spine.   Extremities:  No clubbing, cyanosis, edema, or deformity noted with normal full range of motion of all joints.   Neurologic:  alert & oriented X3 and gait normal.   Skin:   Intact without suspicious lesions or rashes Cervical Nodes:  No lymphadenopathy noted Axillary Nodes:  No palpable lymphadenopathy Psych:  Cognition and judgment appear intact. Alert and cooperative with normal attention span and concentration. No apparent delusions, illusions, hallucinations     Assessment & Plan:  1. HTN (hypertension) Deteriorated.  Strong FH of CVA.  No longer wants to have children and has had some fatigue which may be related to atenolol. Will d/c atenolol and start lisinpril 10-HCTZ 12.5 mg.  Check labs today. - Comprehensive metabolic panel - TSH  2. Screening for ischemic heart disease  - Lipid Panel  3. Anxiety Stable with infrequent prn xanax use.  Rx refilled today. - ALPRAZolam (XANAX) 0.5 MG tablet; Take 1 tablet (0.5 mg total) by mouth at bedtime as needed.  Dispense: 30 tablet; Refill: 0  4. Tobacco user She is considering quitting.  She wants to call her insurance company before I prescribe Chantix.

## 2012-09-15 NOTE — Patient Instructions (Signed)
Preventive Care for Adults, Female A healthy lifestyle and preventive care can promote health and wellness. Preventive health guidelines for women include the following key practices.  A routine yearly physical is a good way to check with your caregiver about your health and preventive screening. It is a chance to share any concerns and updates on your health, and to receive a thorough exam.  Visit your dentist for a routine exam and preventive care every 6 months. Brush your teeth twice a day and floss once a day. Good oral hygiene prevents tooth decay and gum disease.  The frequency of eye exams is based on your age, health, family medical history, use of contact lenses, and other factors. Follow your caregiver's recommendations for frequency of eye exams.  Eat a healthy diet. Foods like vegetables, fruits, whole grains, low-fat dairy products, and lean protein foods contain the nutrients you need without too many calories. Decrease your intake of foods high in solid fats, added sugars, and salt. Eat the right amount of calories for you.Get information about a proper diet from your caregiver, if necessary.  Regular physical exercise is one of the most important things you can do for your health. Most adults should get at least 150 minutes of moderate-intensity exercise (any activity that increases your heart rate and causes you to sweat) each week. In addition, most adults need muscle-strengthening exercises on 2 or more days a week.  Maintain a healthy weight. The body mass index (BMI) is a screening tool to identify possible weight problems. It provides an estimate of body fat based on height and weight. Your caregiver can help determine your BMI, and can help you achieve or maintain a healthy weight.For adults 20 years and older:  A BMI below 18.5 is considered underweight.  A BMI of 18.5 to 24.9 is normal.  A BMI of 25 to 29.9 is considered overweight.  A BMI of 30 and above is  considered obese.  Maintain normal blood lipids and cholesterol levels by exercising and minimizing your intake of saturated fat. Eat a balanced diet with plenty of fruit and vegetables. Blood tests for lipids and cholesterol should begin at age 41 and be repeated every 5 years. If your lipid or cholesterol levels are high, you are over 50, or you are at high risk for heart disease, you may need your cholesterol levels checked more frequently.Ongoing high lipid and cholesterol levels should be treated with medicines if diet and exercise are not effective.  If you smoke, find out from your caregiver how to quit. If you do not use tobacco, do not start.  If you are pregnant, do not drink alcohol. If you are breastfeeding, be very cautious about drinking alcohol. If you are not pregnant and choose to drink alcohol, do not exceed 1 drink per day. One drink is considered to be 12 ounces (355 mL) of beer, 5 ounces (148 mL) of wine, or 1.5 ounces (44 mL) of liquor.  Avoid use of street drugs. Do not share needles with anyone. Ask for help if you need support or instructions about stopping the use of drugs.  High blood pressure causes heart disease and increases the risk of stroke. Your blood pressure should be checked at least every 1 to 2 years. Ongoing high blood pressure should be treated with medicines if weight loss and exercise are not effective.  If you are 65 to 38 years old, ask your caregiver if you should take aspirin to prevent strokes.  Diabetes  screening involves taking a blood sample to check your fasting blood sugar level. This should be done once every 3 years, after age 45, if you are within normal weight and without risk factors for diabetes. Testing should be considered at a younger age or be carried out more frequently if you are overweight and have at least 1 risk factor for diabetes.  Breast cancer screening is essential preventive care for women. You should practice "breast  self-awareness." This means understanding the normal appearance and feel of your breasts and may include breast self-examination. Any changes detected, no matter how small, should be reported to a caregiver. Women in their 20s and 30s should have a clinical breast exam (CBE) by a caregiver as part of a regular health exam every 1 to 3 years. After age 40, women should have a CBE every year. Starting at age 40, women should consider having a mammography (breast X-ray test) every year. Women who have a family history of breast cancer should talk to their caregiver about genetic screening. Women at a high risk of breast cancer should talk to their caregivers about having magnetic resonance imaging (MRI) and a mammography every year.  The Pap test is a screening test for cervical cancer. A Pap test can show cell changes on the cervix that might become cervical cancer if left untreated. A Pap test is a procedure in which cells are obtained and examined from the lower end of the uterus (cervix).  Women should have a Pap test starting at age 21.  Between ages 21 and 29, Pap tests should be repeated every 2 years.  Beginning at age 30, you should have a Pap test every 3 years as long as the past 3 Pap tests have been normal.  Some women have medical problems that increase the chance of getting cervical cancer. Talk to your caregiver about these problems. It is especially important to talk to your caregiver if a new problem develops soon after your last Pap test. In these cases, your caregiver may recommend more frequent screening and Pap tests.  The above recommendations are the same for women who have or have not gotten the vaccine for human papillomavirus (HPV).  If you had a hysterectomy for a problem that was not cancer or a condition that could lead to cancer, then you no longer need Pap tests. Even if you no longer need a Pap test, a regular exam is a good idea to make sure no other problems are  starting.  If you are between ages 65 and 70, and you have had normal Pap tests going back 10 years, you no longer need Pap tests. Even if you no longer need a Pap test, a regular exam is a good idea to make sure no other problems are starting.  If you have had past treatment for cervical cancer or a condition that could lead to cancer, you need Pap tests and screening for cancer for at least 20 years after your treatment.  If Pap tests have been discontinued, risk factors (such as a new sexual partner) need to be reassessed to determine if screening should be resumed.  The HPV test is an additional test that may be used for cervical cancer screening. The HPV test looks for the virus that can cause the cell changes on the cervix. The cells collected during the Pap test can be tested for HPV. The HPV test could be used to screen women aged 30 years and older, and should   be used in women of any age who have unclear Pap test results. After the age of 30, women should have HPV testing at the same frequency as a Pap test.  Colorectal cancer can be detected and often prevented. Most routine colorectal cancer screening begins at the age of 50 and continues through age 75. However, your caregiver may recommend screening at an earlier age if you have risk factors for colon cancer. On a yearly basis, your caregiver may provide home test kits to check for hidden blood in the stool. Use of a small camera at the end of a tube, to directly examine the colon (sigmoidoscopy or colonoscopy), can detect the earliest forms of colorectal cancer. Talk to your caregiver about this at age 50, when routine screening begins. Direct examination of the colon should be repeated every 5 to 10 years through age 75, unless early forms of pre-cancerous polyps or small growths are found.  Hepatitis C blood testing is recommended for all people born from 1945 through 1965 and any individual with known risks for hepatitis C.  Practice  safe sex. Use condoms and avoid high-risk sexual practices to reduce the spread of sexually transmitted infections (STIs). STIs include gonorrhea, chlamydia, syphilis, trichomonas, herpes, HPV, and human immunodeficiency virus (HIV). Herpes, HIV, and HPV are viral illnesses that have no cure. They can result in disability, cancer, and death. Sexually active women aged 25 and younger should be checked for chlamydia. Older women with new or multiple partners should also be tested for chlamydia. Testing for other STIs is recommended if you are sexually active and at increased risk.  Osteoporosis is a disease in which the bones lose minerals and strength with aging. This can result in serious bone fractures. The risk of osteoporosis can be identified using a bone density scan. Women ages 65 and over and women at risk for fractures or osteoporosis should discuss screening with their caregivers. Ask your caregiver whether you should take a calcium supplement or vitamin D to reduce the rate of osteoporosis.  Menopause can be associated with physical symptoms and risks. Hormone replacement therapy is available to decrease symptoms and risks. You should talk to your caregiver about whether hormone replacement therapy is right for you.  Use sunscreen with sun protection factor (SPF) of 30 or more. Apply sunscreen liberally and repeatedly throughout the day. You should seek shade when your shadow is shorter than you. Protect yourself by wearing long sleeves, pants, a wide-brimmed hat, and sunglasses year round, whenever you are outdoors.  Once a month, do a whole body skin exam, using a mirror to look at the skin on your back. Notify your caregiver of new moles, moles that have irregular borders, moles that are larger than a pencil eraser, or moles that have changed in shape or color.  Stay current with required immunizations.  Influenza. You need a dose every fall (or winter). The composition of the flu vaccine  changes each year, so being vaccinated once is not enough.  Pneumococcal polysaccharide. You need 1 to 2 doses if you smoke cigarettes or if you have certain chronic medical conditions. You need 1 dose at age 65 (or older) if you have never been vaccinated.  Tetanus, diphtheria, pertussis (Tdap, Td). Get 1 dose of Tdap vaccine if you are younger than age 65, are over 65 and have contact with an infant, are a healthcare worker, are pregnant, or simply want to be protected from whooping cough. After that, you need a Td   booster dose every 10 years. Consult your caregiver if you have not had at least 3 tetanus and diphtheria-containing shots sometime in your life or have a deep or dirty wound.  HPV. You need this vaccine if you are a woman age 75 or younger. The vaccine is given in 3 doses over 6 months.  Measles, mumps, rubella (MMR). You need at least 1 dose of MMR if you were born in 1957 or later. You may also need a second dose.  Meningococcal. If you are age 18 to 60 and a first-year college student living in a residence hall, or have one of several medical conditions, you need to get vaccinated against meningococcal disease. You may also need additional booster doses.  Zoster (shingles). If you are age 17 or older, you should get this vaccine.  Varicella (chickenpox). If you have never had chickenpox or you were vaccinated but received only 1 dose, talk to your caregiver to find out if you need this vaccine.  Hepatitis A. You need this vaccine if you have a specific risk factor for hepatitis A virus infection or you simply wish to be protected from this disease. The vaccine is usually given as 2 doses, 6 to 18 months apart.  Hepatitis B. You need this vaccine if you have a specific risk factor for hepatitis B virus infection or you simply wish to be protected from this disease. The vaccine is given in 3 doses, usually over 6 months. Preventive Services / Frequency Ages 63 to 36  Blood  pressure check.** / Every 1 to 2 years.  Lipid and cholesterol check.** / Every 5 years beginning at age 57.  Clinical breast exam.** / Every 3 years for women in their 87s and 30s.  Pap test.** / Every 2 years from ages 70 through 33. Every 3 years starting at age 58 through age 66 or 41 with a history of 3 consecutive normal Pap tests.  HPV screening.** / Every 3 years from ages 29 through ages 59 to 33 with a history of 3 consecutive normal Pap tests.  Hepatitis C blood test.** / For any individual with known risks for hepatitis C.  Skin self-exam. / Monthly.  Influenza immunization.** / Every year.  Pneumococcal polysaccharide immunization.** / 1 to 2 doses if you smoke cigarettes or if you have certain chronic medical conditions.  Tetanus, diphtheria, pertussis (Tdap, Td) immunization. / A one-time dose of Tdap vaccine. After that, you need a Td booster dose every 10 years.  HPV immunization. / 3 doses over 6 months, if you are 50 and younger.  Measles, mumps, rubella (MMR) immunization. / You need at least 1 dose of MMR if you were born in 1957 or later. You may also need a second dose.  Meningococcal immunization. / 1 dose if you are age 58 to 13 and a first-year college student living in a residence hall, or have one of several medical conditions, you need to get vaccinated against meningococcal disease. You may also need additional booster doses.  Varicella immunization.** / Consult your caregiver.  Hepatitis A immunization.** / Consult your caregiver. 2 doses, 6 to 18 months apart.  Hepatitis B immunization.** / Consult your caregiver. 3 doses usually over 6 months. ** Family history and personal history of risk and conditions may change your caregiver's recommendations. Document Released: 08/12/2001 Document Revised: 09/08/2011 Document Reviewed: 11/11/2010 [facility] Patient Information 2013 [place], [place].

## 2012-09-16 ENCOUNTER — Encounter: Payer: Self-pay | Admitting: *Deleted

## 2012-09-17 ENCOUNTER — Encounter: Payer: Self-pay | Admitting: Family Medicine

## 2012-09-20 ENCOUNTER — Other Ambulatory Visit: Payer: Self-pay | Admitting: Family Medicine

## 2012-09-20 MED ORDER — LISINOPRIL 10 MG PO TABS: 10.0000 mg | ORAL_TABLET | Freq: Every day | ORAL | Status: DC

## 2012-10-07 ENCOUNTER — Ambulatory Visit (INDEPENDENT_AMBULATORY_CARE_PROVIDER_SITE_OTHER): Payer: BC Managed Care – PPO | Admitting: *Deleted

## 2012-10-07 DIAGNOSIS — Z3042 Encounter for surveillance of injectable contraceptive: Secondary | ICD-10-CM

## 2012-10-07 DIAGNOSIS — Z3049 Encounter for surveillance of other contraceptives: Secondary | ICD-10-CM

## 2012-10-07 MED ORDER — MEDROXYPROGESTERONE ACETATE 150 MG/ML IM SUSP
150.0000 mg | Freq: Once | INTRAMUSCULAR | Status: AC
  Administered 2012-10-07: 150 mg via INTRAMUSCULAR

## 2012-10-07 NOTE — Progress Notes (Signed)
Patient is here for Depo Provera injection she is doing well.  She did have her primary care appointment with Dr. Enos Fling and was very pleased with her visit.

## 2012-10-27 ENCOUNTER — Other Ambulatory Visit: Payer: Self-pay | Admitting: Family Medicine

## 2012-10-27 DIAGNOSIS — F419 Anxiety disorder, unspecified: Secondary | ICD-10-CM

## 2012-10-27 MED ORDER — ALPRAZOLAM 0.5 MG PO TABS: 0.5000 mg | ORAL_TABLET | Freq: Every evening | ORAL | Status: DC | PRN

## 2012-10-27 NOTE — Telephone Encounter (Signed)
Medicine called to [facility]. 

## 2012-11-29 ENCOUNTER — Other Ambulatory Visit: Payer: Self-pay | Admitting: Family Medicine

## 2012-11-29 DIAGNOSIS — F419 Anxiety disorder, unspecified: Secondary | ICD-10-CM

## 2012-11-29 MED ORDER — ALPRAZOLAM 0.5 MG PO TABS: 0.5000 mg | ORAL_TABLET | Freq: Every evening | ORAL | Status: DC | PRN

## 2012-11-29 NOTE — Telephone Encounter (Signed)
Pt requests refill on xanax

## 2012-12-08 ENCOUNTER — Telehealth: Payer: Self-pay | Admitting: *Deleted

## 2012-12-08 DIAGNOSIS — A609 Anogenital herpesviral infection, unspecified: Secondary | ICD-10-CM

## 2012-12-08 MED ORDER — VALACYCLOVIR HCL 500 MG PO TABS: 1000.0000 mg | ORAL_TABLET | Freq: Every day | ORAL | Status: DC

## 2012-12-08 NOTE — Telephone Encounter (Signed)
Patient request refill of generic valtrex as hers expired.

## 2012-12-28 ENCOUNTER — Other Ambulatory Visit: Payer: Self-pay | Admitting: Family Medicine

## 2012-12-28 DIAGNOSIS — F419 Anxiety disorder, unspecified: Secondary | ICD-10-CM

## 2012-12-28 MED ORDER — ALPRAZOLAM 0.5 MG PO TABS: 0.5000 mg | ORAL_TABLET | Freq: Every evening | ORAL | Status: DC | PRN

## 2012-12-29 NOTE — Telephone Encounter (Signed)
Refill called to [facility]. 

## 2013-01-05 ENCOUNTER — Ambulatory Visit (INDEPENDENT_AMBULATORY_CARE_PROVIDER_SITE_OTHER): Payer: BC Managed Care – PPO | Admitting: *Deleted

## 2013-01-05 ENCOUNTER — Ambulatory Visit: Payer: BC Managed Care – PPO

## 2013-01-05 DIAGNOSIS — Z3042 Encounter for surveillance of injectable contraceptive: Secondary | ICD-10-CM

## 2013-01-05 DIAGNOSIS — Z3049 Encounter for surveillance of other contraceptives: Secondary | ICD-10-CM

## 2013-01-05 MED ORDER — MEDROXYPROGESTERONE ACETATE 150 MG/ML IM SUSP
150.0000 mg | Freq: Once | INTRAMUSCULAR | Status: AC
  Administered 2013-01-05: 150 mg via INTRAMUSCULAR

## 2013-01-27 ENCOUNTER — Telehealth: Payer: Self-pay | Admitting: *Deleted

## 2013-01-27 DIAGNOSIS — B379 Candidiasis, unspecified: Secondary | ICD-10-CM

## 2013-01-27 MED ORDER — FLUCONAZOLE 150 MG PO TABS: 150.0000 mg | ORAL_TABLET | Freq: Once | ORAL | Status: DC

## 2013-01-27 NOTE — Telephone Encounter (Signed)
Patient is having vaginal itching and irritation and clumpy white discharge, she would like diflucan called in.

## 2013-01-31 ENCOUNTER — Other Ambulatory Visit: Payer: Self-pay | Admitting: Family Medicine

## 2013-01-31 DIAGNOSIS — F419 Anxiety disorder, unspecified: Secondary | ICD-10-CM

## 2013-01-31 MED ORDER — ALPRAZOLAM 0.5 MG PO TABS: 0.5000 mg | ORAL_TABLET | Freq: Every evening | ORAL | Status: DC | PRN

## 2013-02-01 NOTE — Telephone Encounter (Signed)
Alprazolam refill called to [facility]. 

## 2013-03-14 ENCOUNTER — Other Ambulatory Visit: Payer: Self-pay | Admitting: Family Medicine

## 2013-03-14 DIAGNOSIS — F419 Anxiety disorder, unspecified: Secondary | ICD-10-CM

## 2013-03-14 MED ORDER — ALPRAZOLAM 0.5 MG PO TABS: 0.5000 mg | ORAL_TABLET | Freq: Every evening | ORAL | Status: DC | PRN

## 2013-03-14 NOTE — Telephone Encounter (Signed)
Refill called to [facility]. 

## 2013-03-31 ENCOUNTER — Ambulatory Visit: Payer: BC Managed Care – PPO | Admitting: Family Medicine

## 2013-04-01 ENCOUNTER — Encounter: Payer: Self-pay | Admitting: Radiology

## 2013-04-04 ENCOUNTER — Ambulatory Visit (INDEPENDENT_AMBULATORY_CARE_PROVIDER_SITE_OTHER): Payer: BC Managed Care – PPO | Admitting: Family Medicine

## 2013-04-04 ENCOUNTER — Encounter: Payer: Self-pay | Admitting: Family Medicine

## 2013-04-04 VITALS — BP 144/84 | HR 104 | Temp 98.5°F | Wt 128.0 lb

## 2013-04-04 DIAGNOSIS — L02219 Cutaneous abscess of trunk, unspecified: Secondary | ICD-10-CM

## 2013-04-04 DIAGNOSIS — L02214 Cutaneous abscess of groin: Secondary | ICD-10-CM

## 2013-04-04 DIAGNOSIS — N390 Urinary tract infection, site not specified: Secondary | ICD-10-CM

## 2013-04-04 DIAGNOSIS — Z309 Encounter for contraceptive management, unspecified: Secondary | ICD-10-CM

## 2013-04-04 MED ORDER — SULFAMETHOXAZOLE-TRIMETHOPRIM 800-160 MG PO TABS: 1.0000 | ORAL_TABLET | Freq: Two times a day (BID) | ORAL | Status: AC

## 2013-04-04 MED ORDER — MEDROXYPROGESTERONE ACETATE 150 MG/ML IM SUSP
150.0000 mg | Freq: Once | INTRAMUSCULAR | Status: AC
  Administered 2013-04-04: 150 mg via INTRAMUSCULAR

## 2013-04-04 NOTE — Patient Instructions (Addendum)
Good to see you. It looks great- keep doing warm baths/compresses. You can apply topical antibiotic to area. If symptoms worsen, start taking oral antibiotic and call me.

## 2013-04-04 NOTE — Progress Notes (Signed)
Subjective:    Patient ID: Catherine Gilbert, female    DOB: [birth year], 37 y.o.   MRN: [id number]  HPI  37 yo here for bump in her groin.  Was dime sized a month ago, then grew larger and painful last week and drained.  No longer painful, much smaller but she wanted to get it checked out.  No fevers or chills. No n/v/d.  Due for Depo provera injection today.  No new soaps or detergents.  Never had anything like this in past.  Warm compresses have also been helping.  Patient Active Problem List   Diagnosis Date Noted  . Groin abscess 04/04/2013  . HTN (hypertension) 09/15/2012  . Stricture and stenosis of cervix 09/24/2011  . Initiation of Depo Provera 09/24/2011  . Dysmenorrhea 08/27/2011  . Tobacco user 04/23/2011  . Anxiety state, unspecified 04/23/2011   Past Medical History  Diagnosis Date  . Hypertension   . Depression   . Anxiety   . HSV (herpes simplex virus) infection   . Abnormal Pap smear    Past Surgical History  Procedure Laterality Date  . Cesarean section      x 3   . Tubal ligation    . Dilation and curettage of uterus    . Colposcopy  2010   History  Substance Use Topics  . Smoking status: Current Every Day Smoker -- 20 years    Types: Cigarettes  . Smokeless tobacco: Never Used     Comment: down to 1-2 cigarettes per day  . Alcohol Use: No   Family History  Problem Relation Age of Onset  . Stroke Mother 65    massive stroke  . Heart disease Father 42    congestive heart failure  . Hypertension Father   . Cancer Maternal Grandmother     breast cancer   No Known Allergies Current Outpatient Prescriptions on File Prior to Visit  Medication Sig Dispense Refill  . ALPRAZolam (XANAX) 0.5 MG tablet Take 1 tablet (0.5 mg total) by mouth at bedtime as needed.  30 tablet  0  . lisinopril (PRINIVIL,ZESTRIL) 10 MG tablet Take 1 tablet (10 mg total) by mouth daily.  90 tablet  3  . medroxyPROGESTERone (DEPO-PROVERA) 150 MG/ML injection       .  omeprazole (PRILOSEC) 20 MG capsule Take 1 tablet by mouth Daily.      . valACYclovir (VALTREX) 500 MG tablet Take 2 tablets (1,000 mg total) by mouth daily. Take 2 tablets by mouth Twice daily as needed.  60 tablet  11   No current facility-administered medications on file prior to visit.   The PMH, PSH, Social History, Family History, Medications, and allergies have been reviewed in [facility], and have been updated if relevant.   Review of Systems    See HPI Objective:   Physical Exam  Constitutional: She appears well-developed and well-nourished. No distress.  HENT:  Head: Normocephalic.  Genitourinary: Vagina normal.    There is no rash, tenderness, lesion or injury on the right labia. There is no rash, tenderness, lesion or injury on the left labia.  Psychiatric: She has a normal mood and affect. Her speech is normal and behavior is normal. Judgment and thought content normal. Cognition and memory are normal.   BP 144/84  Pulse 104  Temp(Src) 98.5 F (36.9 C) (Oral)  Wt 128 lb (58.06 kg)  BMI 21.3 kg/m2  SpO2 99%     Assessment & Plan:  1. Contraception management  - medroxyPROGESTERone (  DEPO-PROVERA) injection 150 mg; Inject 1 mL (150 mg total) into the muscle once.  2. Groin abscess  Resolving.  Reassurance provided- advised continue supportive care with warm soaks and compresses. Ok to use topical abx. Rx printed for po Bactrim to use only if symptoms worsen and she will call me if she starts taking this. Call or return to clinic prn if these symptoms worsen or fail to improve as anticipated. The patient indicates understanding of these issues and agrees with the plan.

## 2013-04-25 ENCOUNTER — Other Ambulatory Visit: Payer: Self-pay | Admitting: Family Medicine

## 2013-04-25 NOTE — Telephone Encounter (Signed)
Ok to refill 

## 2013-04-25 NOTE — Telephone Encounter (Signed)
Last OV 04/04/2013.  Ok to refill?

## 2013-04-25 NOTE — Telephone Encounter (Signed)
Called to [facility]. 

## 2013-04-27 NOTE — Telephone Encounter (Signed)
Error

## 2013-05-30 ENCOUNTER — Other Ambulatory Visit: Payer: Self-pay | Admitting: Family Medicine

## 2013-05-30 NOTE — Telephone Encounter (Signed)
plz phone in. 

## 2013-05-30 NOTE — Telephone Encounter (Signed)
Rx called in as directed.   

## 2013-06-29 ENCOUNTER — Other Ambulatory Visit: Payer: Self-pay | Admitting: Family Medicine

## 2013-06-29 NOTE — Telephone Encounter (Signed)
Spoke to pt and informed her that Rx has been called into requested pharmacy

## 2013-06-29 NOTE — Telephone Encounter (Signed)
Ok to refill 

## 2013-07-12 ENCOUNTER — Telehealth: Payer: Self-pay

## 2013-07-12 NOTE — Telephone Encounter (Signed)
Yes this should be ok but will forward to [place] because I think our policy will require 2 pregnancy tests.

## 2013-07-12 NOTE — Telephone Encounter (Signed)
Pt had last Depo injection 04/04/2013; pt was to return for next Depo 06/20/13 - 07/04/13. Pt has changed next depo injection to 07/21/13. Pt said due to her work schedule and cost pt does not want to come for 2 visits to get her next Depo. Pt said she gets Depo to help with menstrual cramps not for contraception?Please advise.

## 2013-07-12 NOTE — Telephone Encounter (Signed)
The new protocol we are putting in place instructs pt to have a pregnancy test if it has been greater than 13 weeks since her last Depo.   As long as it is negative, she can either choose to get the Depo then and use contraception for 7 days or she can wait 2 weeks to take another test to make absolute sure and then get the Depo at that point.  Please give these instructions to pt and let her decide.

## 2013-07-13 ENCOUNTER — Ambulatory Visit: Payer: BC Managed Care – PPO

## 2013-07-13 NOTE — Telephone Encounter (Signed)
Left v/m for pt to cb.

## 2013-07-13 NOTE — Telephone Encounter (Signed)
Spoke with pt and pt is sure that she is not pregnant and opts for getting pregnancy test and when neg going ahead and getting Depo injection and will use other contraceptive for 7 days. Pt said she cannot afford 2 visits in 2 weeks. Pt scheduled 07/21/13.

## 2013-07-20 ENCOUNTER — Encounter: Payer: Self-pay | Admitting: Radiology

## 2013-07-21 ENCOUNTER — Ambulatory Visit (INDEPENDENT_AMBULATORY_CARE_PROVIDER_SITE_OTHER): Payer: BC Managed Care – PPO

## 2013-07-21 ENCOUNTER — Ambulatory Visit: Payer: BC Managed Care – PPO

## 2013-07-21 DIAGNOSIS — Z309 Encounter for contraceptive management, unspecified: Secondary | ICD-10-CM

## 2013-07-21 LAB — POCT URINE PREGNANCY: Preg Test, Ur: NEGATIVE

## 2013-07-21 MED ORDER — MEDROXYPROGESTERONE ACETATE 150 MG/ML IM SUSP
150.0000 mg | Freq: Once | INTRAMUSCULAR | Status: AC
  Administered 2013-07-21: 150 mg via INTRAMUSCULAR

## 2013-07-25 ENCOUNTER — Other Ambulatory Visit: Payer: Self-pay | Admitting: Family Medicine

## 2013-07-25 NOTE — Telephone Encounter (Signed)
Last office visit 04/04/2013.  Last refilled 06/29/2013.  Ok to refill?

## 2013-07-25 NOTE — Telephone Encounter (Signed)
Called to [employer] [facility].

## 2013-08-25 ENCOUNTER — Other Ambulatory Visit: Payer: Self-pay | Admitting: Family Medicine

## 2013-08-26 NOTE — Telephone Encounter (Signed)
Pt requesting medication refill. Last ov 03/2013 with no future appts scheduled. pls advise

## 2013-08-29 NOTE — Telephone Encounter (Signed)
Lm on pts vm informing her Rx has been called in to requested pharmacy 

## 2013-08-31 ENCOUNTER — Encounter: Payer: Self-pay | Admitting: Family Medicine

## 2013-09-17 ENCOUNTER — Other Ambulatory Visit: Payer: Self-pay | Admitting: Family Medicine

## 2013-09-20 ENCOUNTER — Other Ambulatory Visit: Payer: Self-pay | Admitting: Family Medicine

## 2013-09-21 NOTE — Telephone Encounter (Signed)
Pt requesting medication refill. Last ov 06/2012 with upcoming 09/2013 appt. pls advise

## 2013-09-21 NOTE — Telephone Encounter (Signed)
Spoke to pt and informed her Rx has been called in to requested pharmacy 

## 2013-10-10 ENCOUNTER — Telehealth: Payer: Self-pay

## 2013-10-10 NOTE — Telephone Encounter (Signed)
Pt had to change CPX and depo inj until 10/17/13; pt wanted to make sure that date was OK for depo. Pt had last depo injection 07/21/13 and next depo due between 10/06/13 and 10/20/13. Pt advised 10/17/13 would be OK. Pt voiced understanding.

## 2013-10-11 ENCOUNTER — Ambulatory Visit: Payer: BC Managed Care – PPO

## 2013-10-11 ENCOUNTER — Encounter: Payer: BC Managed Care – PPO | Admitting: Family Medicine

## 2013-10-17 ENCOUNTER — Ambulatory Visit (INDEPENDENT_AMBULATORY_CARE_PROVIDER_SITE_OTHER): Payer: BC Managed Care – PPO | Admitting: Family Medicine

## 2013-10-17 ENCOUNTER — Telehealth: Payer: Self-pay | Admitting: Family Medicine

## 2013-10-17 ENCOUNTER — Encounter: Payer: Self-pay | Admitting: Family Medicine

## 2013-10-17 VITALS — BP 118/78 | HR 99 | Temp 98.4°F | Ht 64.5 in | Wt 130.2 lb

## 2013-10-17 DIAGNOSIS — I10 Essential (primary) hypertension: Secondary | ICD-10-CM

## 2013-10-17 DIAGNOSIS — Z113 Encounter for screening for infections with a predominantly sexual mode of transmission: Secondary | ICD-10-CM

## 2013-10-17 DIAGNOSIS — F411 Generalized anxiety disorder: Secondary | ICD-10-CM

## 2013-10-17 DIAGNOSIS — F17200 Nicotine dependence, unspecified, uncomplicated: Secondary | ICD-10-CM

## 2013-10-17 DIAGNOSIS — Z720 Tobacco use: Secondary | ICD-10-CM

## 2013-10-17 DIAGNOSIS — L732 Hidradenitis suppurativa: Secondary | ICD-10-CM | POA: Insufficient documentation

## 2013-10-17 DIAGNOSIS — Z01419 Encounter for gynecological examination (general) (routine) without abnormal findings: Secondary | ICD-10-CM | POA: Insufficient documentation

## 2013-10-17 DIAGNOSIS — Z136 Encounter for screening for cardiovascular disorders: Secondary | ICD-10-CM

## 2013-10-17 DIAGNOSIS — Z1231 Encounter for screening mammogram for malignant neoplasm of breast: Secondary | ICD-10-CM

## 2013-10-17 DIAGNOSIS — Z0000 Encounter for general adult medical examination without abnormal findings: Secondary | ICD-10-CM

## 2013-10-17 LAB — COMPREHENSIVE METABOLIC PANEL
ALT: 20 U/L (ref 0–35)
AST: 18 U/L (ref 0–37)
Albumin: 4.4 g/dL (ref 3.5–5.2)
Alkaline Phosphatase: 42 U/L (ref 39–117)
BUN: 7 mg/dL (ref 6–23)
CO2: 26 mEq/L (ref 19–32)
Calcium: 9.6 mg/dL (ref 8.4–10.5)
Chloride: 106 mEq/L (ref 96–112)
Creatinine, Ser: 0.8 mg/dL (ref 0.4–1.2)
GFR: 81.57 mL/min (ref >60.00)
Glucose, Bld: 84 mg/dL (ref 70–99)
Potassium: 3.7 mEq/L (ref 3.5–5.1)
Sodium: 140 mEq/L (ref 135–145)
Total Bilirubin: 0.7 mg/dL (ref 0.3–1.2)
Total Protein: 7.4 g/dL (ref 6.0–8.3)

## 2013-10-17 LAB — CBC WITH DIFFERENTIAL/PLATELET
Basophils Absolute: 0 10*3/uL (ref 0.0–0.1)
Basophils Relative: 0.3 % (ref 0.0–3.0)
Eosinophils Absolute: 0.1 10*3/uL (ref 0.0–0.7)
Eosinophils Relative: 1.3 % (ref 0.0–5.0)
HCT: 40.6 % (ref 36.0–46.0)
Hemoglobin: 13.8 g/dL (ref 12.0–15.0)
Lymphocytes Relative: 25.1 % (ref 12.0–46.0)
Lymphs Abs: 2.6 10*3/uL (ref 0.7–4.0)
MCHC: 33.9 g/dL (ref 30.0–36.0)
MCV: 94.2 fl (ref 78.0–100.0)
Monocytes Absolute: 0.5 10*3/uL (ref 0.1–1.0)
Monocytes Relative: 4.5 % (ref 3.0–12.0)
Neutro Abs: 7.1 10*3/uL (ref 1.4–7.7)
Neutrophils Relative %: 68.8 % (ref 43.0–77.0)
Platelets: 216 10*3/uL (ref 150.0–400.0)
RBC: 4.31 Mil/uL (ref 3.87–5.11)
RDW: 12.8 % (ref 11.5–14.6)
WBC: 10.4 10*3/uL (ref 4.5–10.5)

## 2013-10-17 LAB — LIPID PANEL
Cholesterol: 155 mg/dL (ref 0–200)
HDL: 32.1 mg/dL — ABNORMAL LOW (ref >39.00)
LDL Cholesterol: 105 mg/dL — ABNORMAL HIGH (ref 0–99)
Total CHOL/HDL Ratio: 5
Triglycerides: 89 mg/dL (ref 0.0–149.0)
VLDL: 17.8 mg/dL (ref 0.0–40.0)

## 2013-10-17 LAB — TSH: TSH: 0.5 u[IU]/mL (ref 0.35–5.50)

## 2013-10-17 NOTE — Progress Notes (Signed)
Subjective:   Patient ID: Catherine Gilbert, female    DOB: [birth year], 38 y.o.   MRN: [id number]  Catherine Gilbert is a pleasant 38 y.o. year old female who presents to clinic today with Annual Exam and Contraception  on 10/17/2013  HPI: Last pap smear 09/15/12- neg- Mora Bellman, MD. Due for preventative labs. Has never had a mammogram- will be 39 in 04/2014.  On depo provera- has minimal periods.  Does have a bump on her vulva she is concerned about.   She has multiple like this before.  This one started draining pus last night.  No fevers or chills.  Tobacco abuse- does want to quit smoking.  Having marital stressors currently.  Thought about e cigs.   Patient Active Problem List   Diagnosis Date Noted  . Routine general medical examination at a health care facility 10/17/2013  . Encounter for routine gynecological examination 10/17/2013  . Hidradenitis 10/17/2013  . Groin abscess 04/04/2013  . HTN (hypertension) 09/15/2012  . Stricture and stenosis of cervix 09/24/2011  . Initiation of Depo Provera 09/24/2011  . Dysmenorrhea 08/27/2011  . Tobacco user 04/23/2011  . Anxiety state, unspecified 04/23/2011   Past Medical History  Diagnosis Date  . Hypertension   . Depression   . Anxiety   . HSV (herpes simplex virus) infection   . Abnormal Pap smear    Past Surgical History  Procedure Laterality Date  . Cesarean section      x 3   . Tubal ligation    . Dilation and curettage of uterus    . Colposcopy  2010   History  Substance Use Topics  . Smoking status: Current Every Day Smoker -- 20 years    Types: Cigarettes  . Smokeless tobacco: Never Used     Comment: down to 1-2 cigarettes per day  . Alcohol Use: No   Family History  Problem Relation Age of Onset  . Stroke Mother 71    massive stroke  . Heart disease Father 37    congestive heart failure  . Hypertension Father   . Cancer Maternal Grandmother     breast cancer   No Known Allergies Current  Outpatient Prescriptions on File Prior to Visit  Medication Sig Dispense Refill  . ALPRAZolam (XANAX) 0.5 MG tablet TAKE 1 TABLET BY MOUTH EVERY NIGHT AT BEDTIME AS NEEDED  30 tablet  0  . lisinopril (PRINIVIL,ZESTRIL) 10 MG tablet TAKE 1 TABLET BY MOUTH EVERY DAY  30 tablet  0  . medroxyPROGESTERone (DEPO-PROVERA) 150 MG/ML injection       . omeprazole (PRILOSEC) 20 MG capsule Take 1 tablet by mouth Daily.      . valACYclovir (VALTREX) 500 MG tablet Take 2 tablets (1,000 mg total) by mouth daily. Take 2 tablets by mouth Twice daily as needed.  60 tablet  11   No current facility-administered medications on file prior to visit.   The PMH, PSH, Social History, Family History, Medications, and allergies have been reviewed in [facility] and have been updated if relevant.    Review of Systems    See HPI  Denies anxiety or depression No changes in her bowel habits or blood in her stool No vaginal discharge or dysuria No CP or SOB Objective:    BP 118/78  Pulse 99  Temp(Src) 98.4 F (36.9 C) (Oral)  Ht 5' 4.5" (1.638 m)  Wt 130 lb 4 oz (59.081 kg)  BMI 22.02 kg/m2  SpO2 99%  Physical Exam   General:  Well-developed,well-nourished,in no acute distress; alert,appropriate and cooperative throughout examination Head:  normocephalic and atraumatic.   Eyes:  vision grossly intact, pupils equal, pupils round, and pupils reactive to light.   Ears:  R ear normal and L ear normal.   Nose:  no external deformity.   Mouth:  good dentition.   Neck:  No deformities, masses, or tenderness noted. Breasts:  No mass, nodules, thickening, tenderness, bulging, retraction, inflamation, nipple discharge or skin changes noted.   Lungs:  Normal respiratory effort, chest expands symmetrically. Lungs are clear to auscultation, no crackles or wheezes. Heart:  Normal rate and regular rhythm. S1 and S2 normal without gallop, murmur, click, rub or other extra sounds. Abdomen:  Bowel sounds positive,abdomen  soft and non-tender without masses, organomegaly or hernias noted. Rectal:  no external abnormalities.   Genitalia:  Pelvic Exam:        External: Non fluctuant small raised abscess on right labia majora, mildly TTP       Msk:  No deformity or scoliosis noted of thoracic or lumbar spine.   Extremities:  No clubbing, cyanosis, edema, or deformity noted with normal full range of motion of all joints.   Neurologic:  alert & oriented X3 and gait normal.   Skin:  Intact without suspicious lesions or rashes Cervical Nodes:  No lymphadenopathy noted Axillary Nodes:  No palpable lymphadenopathy Psych:  Cognition and judgment appear intact. Alert and cooperative with normal attention span and concentration. No apparent delusions, illusions, hallucinations       Assessment & Plan:   Routine general medical examination at a health care facility  Encounter for routine gynecological examination  Tobacco user  HTN (hypertension)  Anxiety state, unspecified No Follow-up on file.

## 2013-10-17 NOTE — Telephone Encounter (Signed)
Relevant patient education assigned to patient using Emmi. ° °

## 2013-10-17 NOTE — Assessment & Plan Note (Signed)
Discussed smoking cessation.  She has quit before, feels it is more of a habit than an addiction for her.

## 2013-10-17 NOTE — Patient Instructions (Addendum)
It was great to see you. Please call to schedule your mammogram after [birth year]01/2014- happy birthday.  We will call you with your lab results.  Hidradenitis Suppurativa, Sweat Gland Abscess Hidradenitis suppurativa is a long lasting (chronic), uncommon disease of the sweat glands. With this, boil-like lumps and scarring develop in the groin, some times under the arms (axillae), and under the breasts. It may also uncommonly occur behind the ears, in the crease of the buttocks, and around the genitals.  CAUSES  The cause is from a blocking of the sweat glands. They then become infected. It may cause drainage and odor. It is not contagious. So it cannot be given to someone else. It most often shows up in puberty (about 87 to 39 years of age). But it may happen much later. It is similar to acne which is a disease of the sweat glands. This condition is slightly more common in African-Americans and women. SYMPTOMS   Hidradenitis usually starts as one or more red, tender, swellings in the groin or under the arms (axilla).  Over a period of hours to days the lesions get larger. They often open to the skin surface, draining clear to yellow-colored fluid.  The infected area heals with scarring. DIAGNOSIS  Your caregiver makes this diagnosis by looking at you. Sometimes cultures (growing germs on plates in the lab) may be taken. This is to see what germ (bacterium) is causing the infection.  TREATMENT   Topical germ killing medicine applied to the skin (antibiotics) are the treatment of choice. Antibiotics taken by mouth (systemic) are sometimes needed when the condition is getting worse or is severe.  Avoid tight-fitting clothing which traps moisture in.  Dirt does not cause hidradenitis and it is not caused by poor hygiene.  Involved areas should be cleaned daily using an antibacterial soap. Some patients find that the liquid form of Lever 2000, applied to the involved areas as a lotion after bathing,  can help reduce the odor related to this condition.  Sometimes surgery is needed to drain infected areas or remove scarred tissue. Removal of large amounts of tissue is used only in severe cases.  Birth control pills may be helpful.  Oral retinoids (vitamin A derivatives) for 6 to 12 months which are effective for acne may also help this condition.  Weight loss will improve but not cure hidradenitis. It is made worse by being overweight. But the condition is not caused by being overweight.  This condition is more common in people who have had acne.  It may become worse under stress. There is no medical cure for hidradenitis. It can be controlled, but not cured. The condition usually continues for years with periods of getting worse and getting better (remission). Document Released: 01/29/2004 Document Revised: 09/08/2011 Document Reviewed: 02/14/2008 [facility] Patient Information 2014 [place].

## 2013-10-17 NOTE — Assessment & Plan Note (Signed)
Discussed USPSTF recommendations of cervical cancer screening.  She is aware that interval of 3 years is recommended and she agrees to defer pap smear today.

## 2013-10-17 NOTE — Progress Notes (Signed)
Pre visit review using our clinic review tool, if applicable. No additional management support is needed unless otherwise documented below in the visit note. 

## 2013-10-17 NOTE — Assessment & Plan Note (Signed)
Reviewed preventive care protocols, scheduled due services, and updated immunizations Discussed nutrition, exercise, diet, and healthy lifestyle.  Orders Placed This Encounter  Procedures  . MM Digital Screening  . CBC with Differential  . Comprehensive metabolic panel  . Lipid panel  . TSH  . HIV Antibody  . RPR  . HSV(herpes simplex vrs) 1+2 ab-IgM

## 2013-10-17 NOTE — Assessment & Plan Note (Signed)
See AVS for supportive care. Does not need I and D or oral abx at this point. Call or return to clinic prn if these symptoms worsen or fail to improve as anticipated.

## 2013-10-18 ENCOUNTER — Telehealth: Payer: Self-pay | Admitting: Family Medicine

## 2013-10-18 ENCOUNTER — Encounter: Payer: Self-pay | Admitting: *Deleted

## 2013-10-18 LAB — HIV ANTIBODY (ROUTINE TESTING W REFLEX): HIV 1&2 Ab, 4th Generation: NONREACTIVE

## 2013-10-18 LAB — RPR

## 2013-10-18 LAB — HSV(HERPES SIMPLEX VRS) I + II AB-IGM: Herpes Simplex Vrs I&II-IgM Ab (EIA): 2.06 INDEX — ABNORMAL HIGH

## 2013-10-18 NOTE — Telephone Encounter (Signed)
Relevant patient education assigned to patient using Emmi. ° °

## 2013-10-19 ENCOUNTER — Other Ambulatory Visit: Payer: Self-pay | Admitting: Family Medicine

## 2013-10-20 NOTE — Telephone Encounter (Signed)
Rx called in as prescribed and paper Rx destroyed

## 2013-10-20 NOTE — Telephone Encounter (Signed)
Electronic refill request, last OV 10/17/13

## 2013-11-18 ENCOUNTER — Other Ambulatory Visit: Payer: Self-pay | Admitting: Family Medicine

## 2013-11-18 NOTE — Telephone Encounter (Signed)
Pt requesting medication refill. Last ov 09/2013-CPE with no future appt scheduled. pls advise

## 2013-11-22 NOTE — Telephone Encounter (Signed)
Lm on pts vm informing her Rx has been called in to requested pharmacy 

## 2013-12-26 ENCOUNTER — Other Ambulatory Visit: Payer: Self-pay | Admitting: Family Medicine

## 2013-12-26 NOTE — Telephone Encounter (Signed)
Lm on pts vm informing him Rx has been faxed to requested pharmacy

## 2013-12-26 NOTE — Telephone Encounter (Signed)
Pt requesting medication refill. Last f/u appt 09/2013- CPE with no future appts scheduled. pls advise

## 2014-01-12 ENCOUNTER — Telehealth: Payer: Self-pay

## 2014-01-12 NOTE — Telephone Encounter (Signed)
Will route to [place].

## 2014-01-12 NOTE — Telephone Encounter (Signed)
No record of pt receiving, nor is it in Dr Hulen Shouts note. OK per Dr Deborra Medina for pt to receive, just have pt complete pregnancy test

## 2014-01-12 NOTE — Telephone Encounter (Signed)
Pt called to schedule Depo injection; pt said she is sure she had last Depo at 10/17/13 visit; cannot find documentation of Depo injection on 10/17/13. According to depo calendar if pt got Depo on 10/17/13 pt should receive next depo 01/02/14 - 01/16/14. Pt scheduled nurse visit for Depo on 01/13/14.Please advise.

## 2014-01-12 NOTE — Telephone Encounter (Signed)
Edited appt note on 01/13/14 for pt to have pregnancy test prior to Depo. Earl Lagos said she spoke with pt.

## 2014-01-13 ENCOUNTER — Ambulatory Visit (INDEPENDENT_AMBULATORY_CARE_PROVIDER_SITE_OTHER): Payer: BC Managed Care – PPO | Admitting: *Deleted

## 2014-01-13 DIAGNOSIS — Z309 Encounter for contraceptive management, unspecified: Secondary | ICD-10-CM

## 2014-01-13 MED ORDER — MEDROXYPROGESTERONE ACETATE 150 MG/ML IM SUSP
150.0000 mg | Freq: Once | INTRAMUSCULAR | Status: AC
  Administered 2014-01-13: 150 mg via INTRAMUSCULAR

## 2014-02-08 ENCOUNTER — Other Ambulatory Visit: Payer: Self-pay | Admitting: Family Medicine

## 2014-02-09 NOTE — Telephone Encounter (Signed)
Rx faxed to requested pharmacy 

## 2014-02-09 NOTE — Telephone Encounter (Signed)
Pt requesting medication refill. Last f/u appt 09/2013-CPE with no future appts scheduled. pls advise

## 2014-02-27 ENCOUNTER — Other Ambulatory Visit: Payer: Self-pay | Admitting: Obstetrics and Gynecology

## 2014-04-02 ENCOUNTER — Other Ambulatory Visit: Payer: Self-pay | Admitting: Family Medicine

## 2014-04-03 NOTE — Telephone Encounter (Signed)
Pt requesting a medication refill. Last ov 09/2013. Ok to fill per Dr Deborra Medina. Rx to be faxed to requested pharmacy before end of day.

## 2014-04-06 ENCOUNTER — Ambulatory Visit (INDEPENDENT_AMBULATORY_CARE_PROVIDER_SITE_OTHER): Payer: BC Managed Care – PPO

## 2014-04-06 DIAGNOSIS — Z3042 Encounter for surveillance of injectable contraceptive: Secondary | ICD-10-CM

## 2014-04-06 MED ORDER — MEDROXYPROGESTERONE ACETATE 150 MG/ML IM SUSP
150.0000 mg | Freq: Once | INTRAMUSCULAR | Status: AC
  Administered 2014-04-06: 150 mg via INTRAMUSCULAR

## 2014-04-17 ENCOUNTER — Other Ambulatory Visit: Payer: Self-pay | Admitting: Family Medicine

## 2014-04-28 ENCOUNTER — Telehealth: Payer: Self-pay | Admitting: Family Medicine

## 2014-04-28 NOTE — Telephone Encounter (Signed)
Patient is asking for you to call her.  Patient has a question and she only wants to speak to you.

## 2014-04-28 NOTE — Telephone Encounter (Signed)
Spoke to pt who states that she had a friend that was wanting to establish care, and wanted to confirm that we are not accepting Medicaid

## 2014-05-01 ENCOUNTER — Encounter: Payer: Self-pay | Admitting: Family Medicine

## 2014-05-17 ENCOUNTER — Other Ambulatory Visit: Payer: Self-pay | Admitting: Family Medicine

## 2014-05-18 ENCOUNTER — Other Ambulatory Visit: Payer: Self-pay | Admitting: Family Medicine

## 2014-05-19 ENCOUNTER — Telehealth: Payer: Self-pay | Admitting: Family Medicine

## 2014-05-19 MED ORDER — LISINOPRIL 10 MG PO TABS: 10.0000 mg | ORAL_TABLET | Freq: Every day | ORAL | Status: DC

## 2014-05-19 NOTE — Telephone Encounter (Signed)
Pt had cpe in 09/2013. She would like to know if she can continue getting bp med refilled without an ov. Pt states she has had no problems with this medication. She just started new job and doesn't need to ask off this early in her new position. Pt has made appt for 05/29/14 just in case. Please advise.

## 2014-05-19 NOTE — Telephone Encounter (Signed)
Yes ok to refill but needs to be seen in the next few months.

## 2014-05-29 ENCOUNTER — Encounter: Payer: Self-pay | Admitting: Family Medicine

## 2014-05-29 ENCOUNTER — Ambulatory Visit (INDEPENDENT_AMBULATORY_CARE_PROVIDER_SITE_OTHER): Payer: BC Managed Care – PPO | Admitting: Family Medicine

## 2014-05-29 VITALS — BP 112/68 | HR 90 | Temp 98.2°F | Wt 130.2 lb

## 2014-05-29 DIAGNOSIS — I10 Essential (primary) hypertension: Secondary | ICD-10-CM

## 2014-05-29 DIAGNOSIS — F411 Generalized anxiety disorder: Secondary | ICD-10-CM

## 2014-05-29 DIAGNOSIS — K219 Gastro-esophageal reflux disease without esophagitis: Secondary | ICD-10-CM

## 2014-05-29 HISTORY — DX: Gastro-esophageal reflux disease without esophagitis: K21.9

## 2014-05-29 MED ORDER — ALPRAZOLAM 0.5 MG PO TABS: ORAL_TABLET | ORAL | Status: DC

## 2014-05-29 MED ORDER — OMEPRAZOLE 20 MG PO CPDR: 20.0000 mg | DELAYED_RELEASE_CAPSULE | Freq: Every day | ORAL | Status: DC

## 2014-05-29 NOTE — Progress Notes (Signed)
Subjective:   Patient ID: Catherine Gilbert, female    DOB: December 02, 1975, 38 y.o.   MRN: [id number]  Catherine Gilbert is a pleasant 38 y.o. year old female who presents to clinic today with Follow-up  on 05/29/2014  HPI: HTN- has been treated for HTN since she was in her early 15s.Denies any HA, blurred vision, CP or SOB. Has been well controlled on Atenolol, Lisinopril. Lab Results  Component Value Date   CREATININE 0.8 10/17/2013   BP Readings from Last 3 Encounters:  05/29/14 112/68  10/17/13 118/78  04/04/13 144/84     Anxiety- h/o anxiety and depression in past. Weaned herself off of several SSRIs as she feels she no longer needs them. Takes very occasional xanax- mainly for insomnia when she does take it.  Overall, feels her symptoms are under good control.  GERD- feels her symptoms are well controlled with prn omeprazole.  Current Outpatient Prescriptions on File Prior to Visit  Medication Sig Dispense Refill  . lisinopril (PRINIVIL,ZESTRIL) 10 MG tablet Take 1 tablet (10 mg total) by mouth daily. 30 tablet 0  . medroxyPROGESTERone (DEPO-PROVERA) 150 MG/ML injection     . omeprazole (PRILOSEC) 20 MG capsule Take 1 tablet by mouth Daily.    . valACYclovir (VALTREX) 500 MG tablet TAKE 2 TABLETS BY MOUTH DAILY. THEN TAKE 2 TABLETS BY MOUTH TWICE DAILY AS NEEDED 60 tablet 0   No current facility-administered medications on file prior to visit.    No Known Allergies  Past Medical History  Diagnosis Date  . Hypertension   . Depression   . Anxiety   . HSV (herpes simplex virus) infection   . Abnormal Pap smear     Past Surgical History  Procedure Laterality Date  . Cesarean section      x 3   . Tubal ligation    . Dilation and curettage of uterus    . Colposcopy  2010    Family History  Problem Relation Age of Onset  . Stroke Mother 30    massive stroke  . Heart disease Father 74    congestive heart failure  . Hypertension Father   . Cancer Maternal  Grandmother     breast cancer    History   Social History  . Marital Status: Married    Spouse Name: N/A    Number of Children: N/A  . Years of Education: N/A   Occupational History  . Not on file.   Social History Main Topics  . Smoking status: Current Every Day Smoker -- 20 years    Types: Cigarettes  . Smokeless tobacco: Never Used     Comment: down to 1-2 cigarettes per day  . Alcohol Use: No  . Drug Use: No  . Sexual Activity: Yes    Birth Control/ Protection: Surgical     Comment: tubalization   Other Topics Concern  . Not on file   Social History Narrative   The PMH, PSH, Social History, Family History, Medications, and allergies have been reviewed in [facility], and have been updated if relevant.  Review of Systems See HPI    Denies anxiety or depression No SI or HI No HA  No blurred vision + intermittent insomnia Objective:    BP 112/68 mmHg  Pulse 90  Temp(Src) 98.2 F (36.8 C) (Oral)  Wt 130 lb 4 oz (59.081 kg)  SpO2 99%   Physical Exam  Constitutional: She is oriented to person, place, and time. She appears well-developed  and well-nourished. No distress.  HENT:  Head: Normocephalic.  Cardiovascular: Normal rate.   Pulmonary/Chest: Effort normal.  Neurological: She is alert and oriented to person, place, and time.  Skin: Skin is warm and dry.  Psychiatric: She has a normal mood and affect. Her behavior is normal. Judgment and thought content normal.  Nursing note and vitals reviewed.         Assessment & Plan:   Essential hypertension  Anxiety state No Follow-up on file.

## 2014-05-29 NOTE — Progress Notes (Signed)
Pre visit review using our clinic review tool, if applicable. No additional management support is needed unless otherwise documented below in the visit note. 

## 2014-05-29 NOTE — Assessment & Plan Note (Signed)
Controlled with prn omeprazole. No changes made.

## 2014-05-29 NOTE — Assessment & Plan Note (Signed)
Well controlled on current rx. No changes made today. 

## 2014-05-29 NOTE — Assessment & Plan Note (Signed)
Well controlled. Xanax rx given to pt.

## 2014-05-30 ENCOUNTER — Telehealth: Payer: Self-pay | Admitting: Family Medicine

## 2014-05-30 NOTE — Telephone Encounter (Signed)
emmi emailed °

## 2014-06-15 ENCOUNTER — Other Ambulatory Visit: Payer: Self-pay | Admitting: Family Medicine

## 2014-06-16 MED ORDER — LISINOPRIL 10 MG PO TABS: 10.0000 mg | ORAL_TABLET | Freq: Every day | ORAL | Status: DC

## 2014-06-16 NOTE — Telephone Encounter (Signed)
Pt called requesting reason why lisinopril was not refilled;explained med was refilled today #30 with 0 refills. Pt request additional refills and pt was seen 05/29/14 and refills were called to [facility] at [address]. Pt appreciative.

## 2014-06-16 NOTE — Addendum Note (Signed)
Addended by: Helene Shoe on: 06/16/2014 03:18 PM   Modules accepted: Orders

## 2014-06-27 ENCOUNTER — Ambulatory Visit (INDEPENDENT_AMBULATORY_CARE_PROVIDER_SITE_OTHER): Payer: BC Managed Care – PPO | Admitting: *Deleted

## 2014-06-27 DIAGNOSIS — Z3049 Encounter for surveillance of other contraceptives: Secondary | ICD-10-CM

## 2014-06-27 DIAGNOSIS — Z3042 Encounter for surveillance of injectable contraceptive: Secondary | ICD-10-CM

## 2014-06-27 MED ORDER — MEDROXYPROGESTERONE ACETATE 150 MG/ML IM SUSP
150.0000 mg | Freq: Once | INTRAMUSCULAR | Status: AC
  Administered 2014-06-27: 150 mg via INTRAMUSCULAR

## 2014-07-17 ENCOUNTER — Other Ambulatory Visit: Payer: Self-pay | Admitting: Family Medicine

## 2014-07-17 NOTE — Telephone Encounter (Signed)
Last office visit 05/29/2014.  Last refilled 05/29/2014 for #30 with no refills.  Ok to refill?

## 2014-07-17 NOTE — Telephone Encounter (Signed)
Called to [employer] [facility].

## 2014-08-28 ENCOUNTER — Other Ambulatory Visit: Payer: Self-pay | Admitting: Family Medicine

## 2014-08-29 NOTE — Telephone Encounter (Signed)
Last f/u appt 04/2014

## 2014-08-29 NOTE — Telephone Encounter (Signed)
Rx called in to requested pharmacy 

## 2014-09-08 ENCOUNTER — Telehealth: Payer: Self-pay | Admitting: Family Medicine

## 2014-09-08 NOTE — Telephone Encounter (Signed)
Pt wants to check and see if she can go 2 years in between cpe's . She remembers being told that by Dr Deborra Medina at her last appt.  Is this correct?

## 2014-09-08 NOTE — Telephone Encounter (Signed)
She should have a CPE every year. As long as she her pap smears have been normal, she can get it done every three years after age 40, but physicals are yearly.

## 2014-09-15 NOTE — Telephone Encounter (Signed)
cpe scheduled 4/25

## 2014-09-18 ENCOUNTER — Other Ambulatory Visit: Payer: Self-pay

## 2014-09-18 MED ORDER — OMEPRAZOLE 20 MG PO CPDR: 20.0000 mg | DELAYED_RELEASE_CAPSULE | Freq: Every day | ORAL | Status: DC

## 2014-09-18 MED ORDER — LISINOPRIL 10 MG PO TABS: 10.0000 mg | ORAL_TABLET | Freq: Every day | ORAL | Status: DC

## 2014-09-18 NOTE — Telephone Encounter (Signed)
Pt left v/m; ins is now requiring 90 day refills and use mail order. Pt request rx for alprazolam, lisinopril and omeprazole.Please advise. Pt last seen 05/29/14.

## 2014-09-26 ENCOUNTER — Other Ambulatory Visit: Payer: Self-pay

## 2014-09-26 ENCOUNTER — Ambulatory Visit (INDEPENDENT_AMBULATORY_CARE_PROVIDER_SITE_OTHER): Payer: BLUE CROSS/BLUE SHIELD | Admitting: *Deleted

## 2014-09-26 DIAGNOSIS — Z3042 Encounter for surveillance of injectable contraceptive: Secondary | ICD-10-CM

## 2014-09-26 DIAGNOSIS — Z3049 Encounter for surveillance of other contraceptives: Secondary | ICD-10-CM

## 2014-09-26 MED ORDER — MEDROXYPROGESTERONE ACETATE 150 MG/ML IM SUSP
150.0000 mg | Freq: Once | INTRAMUSCULAR | Status: AC
  Administered 2014-09-26: 150 mg via INTRAMUSCULAR

## 2014-09-26 MED ORDER — LISINOPRIL 10 MG PO TABS: 10.0000 mg | ORAL_TABLET | Freq: Every day | ORAL | Status: DC

## 2014-09-26 MED ORDER — OMEPRAZOLE 20 MG PO CPDR: 20.0000 mg | DELAYED_RELEASE_CAPSULE | Freq: Every day | ORAL | Status: DC

## 2014-09-26 NOTE — Telephone Encounter (Signed)
Pt left v/m requesting refill alprazolam to [facility]. Pt last seen 05/29/14 and last rx printed 08/29/2014.Please advise.

## 2014-09-27 MED ORDER — ALPRAZOLAM 0.5 MG PO TABS: 0.5000 mg | ORAL_TABLET | Freq: Every evening | ORAL | Status: DC | PRN

## 2014-09-27 NOTE — Addendum Note (Signed)
Addended by: Modena Nunnery on: 09/27/2014 11:43 AM   Modules accepted: Orders

## 2014-09-27 NOTE — Telephone Encounter (Signed)
Rx reprinted and faxed to mail order

## 2014-10-23 ENCOUNTER — Encounter: Payer: Self-pay | Admitting: Family Medicine

## 2014-10-23 ENCOUNTER — Ambulatory Visit (INDEPENDENT_AMBULATORY_CARE_PROVIDER_SITE_OTHER): Payer: BLUE CROSS/BLUE SHIELD | Admitting: Family Medicine

## 2014-10-23 ENCOUNTER — Other Ambulatory Visit (HOSPITAL_COMMUNITY)
Admission: RE | Admit: 2014-10-23 | Discharge: 2014-10-23 | Disposition: A | Discharge status: Home / Self Care | Payer: BLUE CROSS/BLUE SHIELD | Source: Ambulatory Visit | Attending: Family Medicine | Admitting: Family Medicine

## 2014-10-23 VITALS — BP 118/82 | HR 109 | Temp 98.6°F | Ht 64.5 in | Wt 137.5 lb

## 2014-10-23 DIAGNOSIS — N946 Dysmenorrhea, unspecified: Secondary | ICD-10-CM | POA: Diagnosis not present

## 2014-10-23 DIAGNOSIS — Z01411 Encounter for gynecological examination (general) (routine) with abnormal findings: Secondary | ICD-10-CM | POA: Diagnosis present

## 2014-10-23 DIAGNOSIS — Z1239 Encounter for other screening for malignant neoplasm of breast: Secondary | ICD-10-CM | POA: Diagnosis not present

## 2014-10-23 DIAGNOSIS — Z01419 Encounter for gynecological examination (general) (routine) without abnormal findings: Secondary | ICD-10-CM

## 2014-10-23 DIAGNOSIS — I10 Essential (primary) hypertension: Secondary | ICD-10-CM

## 2014-10-23 DIAGNOSIS — Z1151 Encounter for screening for human papillomavirus (HPV): Secondary | ICD-10-CM | POA: Diagnosis present

## 2014-10-23 DIAGNOSIS — F411 Generalized anxiety disorder: Secondary | ICD-10-CM | POA: Diagnosis not present

## 2014-10-23 LAB — LIPID PANEL
Cholesterol: 157 mg/dL (ref 0–200)
HDL: 30.1 mg/dL — ABNORMAL LOW (ref >39.00)
LDL Cholesterol: 104 mg/dL — ABNORMAL HIGH (ref 0–99)
NonHDL: 126.9
Total CHOL/HDL Ratio: 5
Triglycerides: 116 mg/dL (ref 0.0–149.0)
VLDL: 23.2 mg/dL (ref 0.0–40.0)

## 2014-10-23 LAB — COMPREHENSIVE METABOLIC PANEL
ALT: 22 U/L (ref 0–35)
AST: 17 U/L (ref 0–37)
Albumin: 4.6 g/dL (ref 3.5–5.2)
Alkaline Phosphatase: 46 U/L (ref 39–117)
BUN: 10 mg/dL (ref 6–23)
CO2: 26 mEq/L (ref 19–32)
Calcium: 9.5 mg/dL (ref 8.4–10.5)
Chloride: 106 mEq/L (ref 96–112)
Creatinine, Ser: 0.96 mg/dL (ref 0.40–1.20)
GFR: 68.6 mL/min (ref >60.00)
Glucose, Bld: 74 mg/dL (ref 70–99)
Potassium: 3.4 mEq/L — ABNORMAL LOW (ref 3.5–5.1)
Sodium: 137 mEq/L (ref 135–145)
Total Bilirubin: 0.3 mg/dL (ref 0.2–1.2)
Total Protein: 7.3 g/dL (ref 6.0–8.3)

## 2014-10-23 LAB — CBC WITH DIFFERENTIAL/PLATELET
Basophils Absolute: 0 10*3/uL (ref 0.0–0.1)
Basophils Relative: 0.4 % (ref 0.0–3.0)
Eosinophils Absolute: 0.2 10*3/uL (ref 0.0–0.7)
Eosinophils Relative: 2.1 % (ref 0.0–5.0)
HCT: 41.9 % (ref 36.0–46.0)
Hemoglobin: 14.2 g/dL (ref 12.0–15.0)
Lymphocytes Relative: 25.3 % (ref 12.0–46.0)
Lymphs Abs: 2.8 10*3/uL (ref 0.7–4.0)
MCHC: 34 g/dL (ref 30.0–36.0)
MCV: 91.9 fl (ref 78.0–100.0)
Monocytes Absolute: 0.5 10*3/uL (ref 0.1–1.0)
Monocytes Relative: 4.6 % (ref 3.0–12.0)
Neutro Abs: 7.6 10*3/uL (ref 1.4–7.7)
Neutrophils Relative %: 67.6 % (ref 43.0–77.0)
Platelets: 240 10*3/uL (ref 150.0–400.0)
RBC: 4.56 Mil/uL (ref 3.87–5.11)
RDW: 12.7 % (ref 11.5–15.5)
WBC: 11.3 10*3/uL — ABNORMAL HIGH (ref 4.0–10.5)

## 2014-10-23 LAB — TSH: TSH: 1.14 u[IU]/mL (ref 0.35–4.50)

## 2014-10-23 MED ORDER — SERTRALINE HCL 25 MG PO TABS: 25.0000 mg | ORAL_TABLET | Freq: Every day | ORAL | Status: DC

## 2014-10-23 NOTE — Assessment & Plan Note (Signed)
Deteriorated. Discussed tx options.  Agrees to restart SSRI- given that she is not feeling depression, only anxious, will start zoloft 25 mg daily. She will call me in 3 weeks with an update. The patient indicates understanding of these issues and agrees with the plan.

## 2014-10-23 NOTE — Assessment & Plan Note (Signed)
On depo provera. She was asking about other options today- discussed ablation.  She will look into this and get back to me.

## 2014-10-23 NOTE — Progress Notes (Signed)
Pre visit review using our clinic review tool, if applicable. No additional management support is needed unless otherwise documented below in the visit note. 

## 2014-10-23 NOTE — Progress Notes (Signed)
Subjective:   Patient ID: Catherine Gilbert, female    DOB: [birth year], 39 y.o.   MRN: [id number]  Catherine Gilbert is a pleasant 39 y.o. year old female who presents to clinic today with Annual Exam  and follow up of chronic medical conditions on 10/23/2014  HPI:  [id number] on depo provera.  Last pap smear was on 09/15/12.  No h/o abnormal pap smears in the past 5 years.  Has never had a mammogram- maternal grandmother died of  breast CA in her 45s.  No other family members with breast CA that she is aware of.  HTN- has been treated for HTN since she was in her early 24s.Denies any HA, blurred vision, CP or SOB. Has been well controlled on Atenolol, Lisinopril. Lab Results  Component Value Date   CREATININE 0.8 10/17/2013   BP Readings from Last 3 Encounters:  10/23/14 118/82  05/29/14 112/68  10/17/13 118/78     Anxiety- h/o anxiety and depression in past. Weaned herself off of several SSRIs as she felt that she no longer needed them. Takes very occasional xanax- mainly for insomnia when she does take it. She is having more episodes of anxiety- thinks it may be when she should be having periods- ammenorhia due to depo. New job- boss makes her very anxious.  Denies feeling depression   Overall, feels her symptoms are under good control.  GERD- feels her symptoms are well controlled with prn omeprazole.  Current Outpatient Prescriptions on File Prior to Visit  Medication Sig Dispense Refill  . ALPRAZolam (XANAX) 0.5 MG tablet Take 1 tablet (0.5 mg total) by mouth at bedtime as needed. 30 tablet 0  . lisinopril (PRINIVIL,ZESTRIL) 10 MG tablet Take 1 tablet (10 mg total) by mouth daily. 90 tablet 1  . medroxyPROGESTERone (DEPO-PROVERA) 150 MG/ML injection     . omeprazole (PRILOSEC) 20 MG capsule Take 1 capsule (20 mg total) by mouth daily. 90 capsule 1  . valACYclovir (VALTREX) 500 MG tablet TAKE 2 TABLETS BY MOUTH DAILY. THEN TAKE 2 TABLETS BY MOUTH TWICE DAILY AS NEEDED 60  tablet 0   No current facility-administered medications on file prior to visit.    No Known Allergies  Past Medical History  Diagnosis Date  . Hypertension   . Depression   . Anxiety   . HSV (herpes simplex virus) infection   . Abnormal Pap smear     Past Surgical History  Procedure Laterality Date  . Cesarean section      x 3   . Tubal ligation    . Dilation and curettage of uterus    . Colposcopy  2010    Family History  Problem Relation Age of Onset  . Stroke Mother 44    massive stroke  . Heart disease Father 71    congestive heart failure  . Hypertension Father   . Cancer Maternal Grandmother     breast cancer    History   Social History  . Marital Status: Married    Spouse Name: N/A  . Number of Children: N/A  . Years of Education: N/A   Occupational History  . Not on file.   Social History Main Topics  . Smoking status: Current Every Day Smoker -- 20 years    Types: Cigarettes  . Smokeless tobacco: Never Used     Comment: down to 1-2 cigarettes per day  . Alcohol Use: No  . Drug Use: No  . Sexual Activity: Yes  Birth Control/ Protection: Surgical     Comment: tubalization   Other Topics Concern  . Not on file   Social History Narrative   The PMH, PSH, Social History, Family History, Medications, and allergies have been reviewed in [facility], and have been updated if relevant.  Review of Systems  Constitutional: Negative.   HENT: Negative.   Eyes: Negative.   Respiratory: Negative.   Cardiovascular: Negative.   Gastrointestinal: Negative.   Endocrine: Negative.   Genitourinary: Negative.   Musculoskeletal: Negative.   Skin: Negative.   Allergic/Immunologic: Negative.   Neurological: Negative.   Hematological: Negative.   Psychiatric/Behavioral: Positive for sleep disturbance. Negative for suicidal ideas, self-injury, dysphoric mood and decreased concentration. The patient is nervous/anxious.   All other systems reviewed and are  negative.      Physical exam:   BP 118/82 mmHg  Pulse 109  Temp(Src) 98.6 F (37 C) (Oral)  Ht 5' 4.5" (1.638 m)  Wt 137 lb 8 oz (62.37 kg)  BMI 23.25 kg/m2  SpO2 98%   Physical Exam  Constitutional: She is oriented to person, place, and time. She appears well-developed and well-nourished. No distress.  HENT:  Head: Normocephalic.  Cardiovascular: Normal rate.   Pulmonary/Chest: Effort normal.  Genitourinary: Vagina normal and uterus normal. No breast swelling, tenderness, discharge or bleeding. Pelvic exam was performed with patient prone. There is no rash, tenderness or lesion on the right labia. There is no rash, tenderness or lesion on the left labia. Cervix exhibits no motion tenderness, no discharge and no friability. Right adnexum displays no mass, no tenderness and no fullness. Left adnexum displays no mass, no tenderness and no fullness.  Neurological: She is alert and oriented to person, place, and time.  Skin: Skin is warm and dry.  Psychiatric: She has a normal mood and affect. Her behavior is normal. Judgment and thought content normal.  Nursing note and vitals reviewed.         Assessment & Plan:   Well woman exam with routine gynecological exam  Essential hypertension  Anxiety state No Follow-up on file.

## 2014-10-23 NOTE — Assessment & Plan Note (Signed)
Well controlled on current rx. No changes made. 

## 2014-10-23 NOTE — Patient Instructions (Signed)
Good to see you. We will call you with your lab results. Please stop by to see Catherine Gilbert on your way out to schedule your mammogram.

## 2014-10-23 NOTE — Assessment & Plan Note (Addendum)
Reviewed preventive care protocols, scheduled due services, and updated immunizations Discussed nutrition, exercise, diet, and healthy lifestyle.  Pap smear done today. Mammogram ordered- she will schedule after she turns 39 in November.  Orders Placed This Encounter  Procedures  . MM Digital Screening  . CBC with Differential/Platelet  . Comprehensive metabolic panel  . Lipid panel  . TSH

## 2014-10-23 NOTE — Addendum Note (Signed)
Addended by: Modena Nunnery on: 10/23/2014 10:11 AM   Modules accepted: Orders

## 2014-10-24 LAB — CYTOLOGY - PAP

## 2014-10-26 ENCOUNTER — Encounter: Payer: Self-pay | Admitting: *Deleted

## 2014-11-20 ENCOUNTER — Telehealth: Payer: Self-pay

## 2014-11-20 NOTE — Telephone Encounter (Signed)
Spoke to pt and advised per Dr Deborra Medina. Pt verbally expressed understanding and is wanting to wean off. Are there any particular direction for doing so?

## 2014-11-20 NOTE — Telephone Encounter (Signed)
Spoke to pt and advised. Pt verbally expressed understanding and will contact office if needing to start new med after weaning.

## 2014-11-20 NOTE — Telephone Encounter (Signed)
Any side effect is possible with any medication.  If she would prefer to wean off of it and try something else, we certainly can.  I am glad to hear that it is improving her symptoms of anxiety.

## 2014-11-20 NOTE — Telephone Encounter (Signed)
Pt was seen 10/23/14 and started on zoloft 25 mg taking one daily for anxiety; anxiety is 98 % better. Pt wonders if has side effect of zoloft; pt forgeting things; pt has left work x 2 and did not lock front door of work and that is part of pts job description; pt will walk in room and when light is on and turn light off and then realize pt is in dark. [facility] [place] if need short term med otherwise [facility]. Pt request cb.

## 2014-11-20 NOTE — Telephone Encounter (Signed)
Yes to wean- 1 tab every other day for two weeks.  Let us know if she would like to start an alternative rx.

## 2014-12-05 ENCOUNTER — Telehealth: Payer: Self-pay | Admitting: Family Medicine

## 2014-12-05 ENCOUNTER — Other Ambulatory Visit: Payer: Self-pay | Admitting: *Deleted

## 2014-12-05 MED ORDER — ALPRAZOLAM 0.5 MG PO TABS: 0.5000 mg | ORAL_TABLET | Freq: Every evening | ORAL | Status: DC | PRN

## 2014-12-05 NOTE — Telephone Encounter (Signed)
Rx faxed to requested pharmacy 

## 2014-12-05 NOTE — Addendum Note (Signed)
Addended by: Modena Nunnery on: 12/05/2014 01:22 PM   Modules accepted: Orders

## 2014-12-05 NOTE — Telephone Encounter (Signed)
Last f/u appt 09/2014-CPE

## 2014-12-05 NOTE — Telephone Encounter (Signed)
Pt left v/m; pt received email from [place] that mail order pharmacy has not gotten response from office for xanax refill. Pt request cb with status of refill.

## 2014-12-05 NOTE — Telephone Encounter (Signed)
Patient wants Catherine Gilbert to call her back about her rx for Zoloft and Xanax today.

## 2014-12-06 MED ORDER — SERTRALINE HCL 25 MG PO TABS: 25.0000 mg | ORAL_TABLET | Freq: Every day | ORAL | Status: DC

## 2014-12-06 NOTE — Addendum Note (Signed)
Addended by: Modena Nunnery on: 12/06/2014 11:41 AM   Modules accepted: Orders

## 2014-12-06 NOTE — Telephone Encounter (Signed)
Spoke to pt and informed her that the Rx was faxed to mail order 06/07. Pt states she received confirmation email this am. Pt also reports she was to wean herself off of zoloft, but has been unable to do so and is questioning if she is able to have refills. pls advise

## 2015-01-02 ENCOUNTER — Other Ambulatory Visit: Payer: Self-pay | Admitting: *Deleted

## 2015-01-02 MED ORDER — SERTRALINE HCL 25 MG PO TABS: 25.0000 mg | ORAL_TABLET | Freq: Every day | ORAL | Status: DC

## 2015-01-19 ENCOUNTER — Other Ambulatory Visit: Payer: Self-pay | Admitting: *Deleted

## 2015-01-19 MED ORDER — ALPRAZOLAM 0.5 MG PO TABS: 0.5000 mg | ORAL_TABLET | Freq: Every evening | ORAL | Status: DC | PRN

## 2015-01-19 NOTE — Telephone Encounter (Signed)
Last f/u appt 09/2014-CPE

## 2015-01-19 NOTE — Addendum Note (Signed)
Addended by: Modena Nunnery on: 01/19/2015 01:20 PM   Modules accepted: Orders

## 2015-01-22 NOTE — Telephone Encounter (Signed)
Rx faxed to requested pharmacy 

## 2015-01-30 ENCOUNTER — Other Ambulatory Visit: Payer: Self-pay | Admitting: Obstetrics and Gynecology

## 2015-03-15 ENCOUNTER — Other Ambulatory Visit: Payer: Self-pay

## 2015-03-15 NOTE — Telephone Encounter (Signed)
Pt left v/m has questions about refills; left v/m requesting cb.

## 2015-03-16 ENCOUNTER — Other Ambulatory Visit: Payer: Self-pay | Admitting: *Deleted

## 2015-03-16 MED ORDER — ALPRAZOLAM 0.5 MG PO TABS: 0.5000 mg | ORAL_TABLET | Freq: Every evening | ORAL | Status: DC | PRN

## 2015-03-16 MED ORDER — LISINOPRIL 10 MG PO TABS: 10.0000 mg | ORAL_TABLET | Freq: Every day | ORAL | Status: DC

## 2015-03-16 MED ORDER — OMEPRAZOLE 20 MG PO CPDR: 20.0000 mg | DELAYED_RELEASE_CAPSULE | Freq: Every day | ORAL | Status: DC

## 2015-03-16 NOTE — Telephone Encounter (Signed)
Pt called back and request refill xanax to [facility]. Pt has 6 days of med left/ rx last refilled # 30 on 01/19/15. Last seen annual exam 10/23/14.

## 2015-03-19 MED ORDER — ALPRAZOLAM 0.5 MG PO TABS
0.5000 mg | ORAL_TABLET | Freq: Every evening | ORAL | Status: DC | PRN
Start: 2015-03-19 — End: 2015-04-26

## 2015-03-19 NOTE — Addendum Note (Signed)
Addended by: Modena Nunnery on: 03/19/2015 12:14 PM   Modules accepted: Orders

## 2015-03-19 NOTE — Telephone Encounter (Signed)
Rx faxed to mail order pharmacy. 

## 2015-03-19 NOTE — Telephone Encounter (Signed)
Rx printed to be faxed to mail order

## 2015-03-28 ENCOUNTER — Encounter (INDEPENDENT_AMBULATORY_CARE_PROVIDER_SITE_OTHER): Payer: Self-pay

## 2015-04-25 ENCOUNTER — Other Ambulatory Visit: Payer: Self-pay | Admitting: Family Medicine

## 2015-04-26 MED ORDER — ALPRAZOLAM 0.5 MG PO TABS: 0.5000 mg | ORAL_TABLET | Freq: Every evening | ORAL | Status: DC | PRN

## 2015-04-26 NOTE — Telephone Encounter (Signed)
Rx faxed to requested pharmacy 

## 2015-04-26 NOTE — Telephone Encounter (Signed)
CPE 09/2014

## 2015-05-03 ENCOUNTER — Encounter: Payer: Self-pay | Admitting: Family Medicine

## 2015-05-03 ENCOUNTER — Other Ambulatory Visit: Payer: Self-pay | Admitting: Family Medicine

## 2015-05-04 ENCOUNTER — Ambulatory Visit: Payer: BLUE CROSS/BLUE SHIELD

## 2015-05-04 ENCOUNTER — Ambulatory Visit
Admission: RE | Admit: 2015-05-04 | Discharge: 2015-05-04 | Disposition: A | Discharge status: Home / Self Care | Payer: BLUE CROSS/BLUE SHIELD | Source: Ambulatory Visit | Attending: Family Medicine | Admitting: Family Medicine

## 2015-05-04 DIAGNOSIS — Z1239 Encounter for other screening for malignant neoplasm of breast: Secondary | ICD-10-CM

## 2015-05-04 DIAGNOSIS — Z1231 Encounter for screening mammogram for malignant neoplasm of breast: Secondary | ICD-10-CM | POA: Diagnosis not present

## 2015-05-08 ENCOUNTER — Encounter: Payer: Self-pay | Admitting: Family Medicine

## 2015-06-13 ENCOUNTER — Other Ambulatory Visit: Payer: Self-pay | Admitting: Family Medicine

## 2015-06-13 MED ORDER — ALPRAZOLAM 0.5 MG PO TABS: 0.5000 mg | ORAL_TABLET | Freq: Every evening | ORAL | Status: DC | PRN

## 2015-06-13 NOTE — Telephone Encounter (Signed)
Last f/u 09/2014 

## 2015-06-14 MED ORDER — ALPRAZOLAM 0.5 MG PO TABS: 0.5000 mg | ORAL_TABLET | Freq: Every evening | ORAL | Status: DC | PRN

## 2015-06-14 NOTE — Telephone Encounter (Signed)
Rx printed and signed off per RBaity. Rx to go to [facility]

## 2015-06-14 NOTE — Addendum Note (Signed)
Addended by: Modena Nunnery on: 06/14/2015 03:40 PM   Modules accepted: Orders

## 2015-07-04 ENCOUNTER — Encounter: Payer: Self-pay | Admitting: Family Medicine

## 2015-07-10 ENCOUNTER — Encounter: Payer: Self-pay | Admitting: Family Medicine

## 2015-08-07 ENCOUNTER — Other Ambulatory Visit (INDEPENDENT_AMBULATORY_CARE_PROVIDER_SITE_OTHER): Payer: BLUE CROSS/BLUE SHIELD

## 2015-08-07 ENCOUNTER — Encounter: Payer: Self-pay | Admitting: Family Medicine

## 2015-08-07 DIAGNOSIS — N920 Excessive and frequent menstruation with regular cycle: Secondary | ICD-10-CM

## 2015-08-07 LAB — CBC
HCT: 35.5 % — ABNORMAL LOW (ref 36.0–46.0)
Hemoglobin: 11.6 g/dL — ABNORMAL LOW (ref 12.0–15.0)
MCHC: 32.7 g/dL (ref 30.0–36.0)
MCV: 89.7 fl (ref 78.0–100.0)
Platelets: 272 10*3/uL (ref 150.0–400.0)
RBC: 3.96 Mil/uL (ref 3.87–5.11)
RDW: 13.7 % (ref 11.5–15.5)
WBC: 10.4 10*3/uL (ref 4.0–10.5)

## 2015-08-08 ENCOUNTER — Other Ambulatory Visit: Payer: Self-pay | Admitting: Internal Medicine

## 2015-08-08 NOTE — Telephone Encounter (Signed)
Last filled 06/14/15 by Regina--please advise if okay to refill

## 2015-08-09 MED ORDER — ALPRAZOLAM 0.5 MG PO TABS: 0.5000 mg | ORAL_TABLET | Freq: Every evening | ORAL | Status: DC | PRN

## 2015-08-10 NOTE — Telephone Encounter (Signed)
Rx faxed to requested pharmacy 

## 2015-09-23 ENCOUNTER — Other Ambulatory Visit: Payer: Self-pay | Admitting: Family Medicine

## 2015-09-24 MED ORDER — ALPRAZOLAM 0.5 MG PO TABS: 0.5000 mg | ORAL_TABLET | Freq: Every evening | ORAL | Status: DC | PRN

## 2015-09-24 NOTE — Addendum Note (Signed)
Addended by: Modena Nunnery on: 09/24/2015 08:41 AM   Modules accepted: Orders

## 2015-09-24 NOTE — Telephone Encounter (Signed)
Last f/u 09/2014 

## 2015-09-24 NOTE — Telephone Encounter (Signed)
Rx faxed to requested pharmacy 

## 2015-10-10 ENCOUNTER — Encounter: Payer: BLUE CROSS/BLUE SHIELD | Admitting: Family Medicine

## 2015-10-22 ENCOUNTER — Encounter: Payer: Self-pay | Admitting: Family Medicine

## 2015-10-23 ENCOUNTER — Other Ambulatory Visit: Payer: Self-pay | Admitting: Family Medicine

## 2015-10-23 MED ORDER — VARENICLINE TARTRATE 0.5 MG X 11 & 1 MG X 42 PO MISC: ORAL | Status: DC

## 2015-10-23 NOTE — Progress Notes (Signed)
Please call in rx as entered below for chantix starter pack.

## 2015-10-25 ENCOUNTER — Encounter: Payer: BLUE CROSS/BLUE SHIELD | Admitting: Family Medicine

## 2015-10-25 NOTE — Telephone Encounter (Signed)
Form completed and faxed back to requested party. Awaiting response

## 2015-10-26 NOTE — Telephone Encounter (Signed)
Additional form received for PA, completed and placed in Dr. Hulen Shouts incbasket for signature.

## 2015-10-29 NOTE — Telephone Encounter (Signed)
Fax received indicating PA denied. Advised pt per [employer], she has to first try and fail on lozenges, gum and the patch. Spoke to pt and advised.

## 2015-10-29 NOTE — Telephone Encounter (Signed)
Additional form faxed back to requested pharmacy

## 2015-10-30 ENCOUNTER — Ambulatory Visit (INDEPENDENT_AMBULATORY_CARE_PROVIDER_SITE_OTHER): Payer: BLUE CROSS/BLUE SHIELD | Admitting: Family Medicine

## 2015-10-30 ENCOUNTER — Ambulatory Visit (INDEPENDENT_AMBULATORY_CARE_PROVIDER_SITE_OTHER)
Admission: RE | Admit: 2015-10-30 | Discharge: 2015-10-30 | Disposition: A | Discharge status: Home / Self Care | Payer: BLUE CROSS/BLUE SHIELD | Source: Ambulatory Visit | Attending: Family Medicine | Admitting: Family Medicine

## 2015-10-30 ENCOUNTER — Encounter: Payer: Self-pay | Admitting: Family Medicine

## 2015-10-30 VITALS — BP 130/80 | HR 79 | Temp 98.6°F | Ht 64.5 in | Wt 155.2 lb

## 2015-10-30 DIAGNOSIS — R0602 Shortness of breath: Secondary | ICD-10-CM

## 2015-10-30 DIAGNOSIS — R0609 Other forms of dyspnea: Secondary | ICD-10-CM

## 2015-10-30 DIAGNOSIS — Z720 Tobacco use: Secondary | ICD-10-CM

## 2015-10-30 DIAGNOSIS — R0600 Dyspnea, unspecified: Secondary | ICD-10-CM

## 2015-10-30 DIAGNOSIS — K0889 Other specified disorders of teeth and supporting structures: Secondary | ICD-10-CM | POA: Diagnosis not present

## 2015-10-30 DIAGNOSIS — R079 Chest pain, unspecified: Secondary | ICD-10-CM | POA: Diagnosis not present

## 2015-10-30 DIAGNOSIS — Z0001 Encounter for general adult medical examination with abnormal findings: Secondary | ICD-10-CM

## 2015-10-30 DIAGNOSIS — Z01419 Encounter for gynecological examination (general) (routine) without abnormal findings: Secondary | ICD-10-CM

## 2015-10-30 DIAGNOSIS — Z0000 Encounter for general adult medical examination without abnormal findings: Secondary | ICD-10-CM | POA: Diagnosis not present

## 2015-10-30 DIAGNOSIS — R6889 Other general symptoms and signs: Secondary | ICD-10-CM

## 2015-10-30 DIAGNOSIS — F411 Generalized anxiety disorder: Secondary | ICD-10-CM

## 2015-10-30 DIAGNOSIS — I10 Essential (primary) hypertension: Secondary | ICD-10-CM

## 2015-10-30 HISTORY — DX: Other forms of dyspnea: R06.09

## 2015-10-30 HISTORY — DX: Dyspnea, unspecified: R06.00

## 2015-10-30 HISTORY — DX: Generalized anxiety disorder: F41.1

## 2015-10-30 LAB — COMPREHENSIVE METABOLIC PANEL
ALT: 12 U/L (ref 0–35)
AST: 15 U/L (ref 0–37)
Albumin: 4.3 g/dL (ref 3.5–5.2)
Alkaline Phosphatase: 44 U/L (ref 39–117)
BUN: 6 mg/dL (ref 6–23)
CO2: 25 mEq/L (ref 19–32)
Calcium: 9.3 mg/dL (ref 8.4–10.5)
Chloride: 106 mEq/L (ref 96–112)
Creatinine, Ser: 0.82 mg/dL (ref 0.40–1.20)
GFR: 81.86 mL/min (ref >60.00)
Glucose, Bld: 90 mg/dL (ref 70–99)
Potassium: 3.9 mEq/L (ref 3.5–5.1)
Sodium: 140 mEq/L (ref 135–145)
Total Bilirubin: 0.2 mg/dL (ref 0.2–1.2)
Total Protein: 6.9 g/dL (ref 6.0–8.3)

## 2015-10-30 LAB — CBC WITH DIFFERENTIAL/PLATELET
Basophils Absolute: 0 10*3/uL (ref 0.0–0.1)
Basophils Relative: 0.3 % (ref 0.0–3.0)
Eosinophils Absolute: 0.2 10*3/uL (ref 0.0–0.7)
Eosinophils Relative: 2.3 % (ref 0.0–5.0)
HCT: 27.8 % — ABNORMAL LOW (ref 36.0–46.0)
Hemoglobin: 9 g/dL — ABNORMAL LOW (ref 12.0–15.0)
Lymphocytes Relative: 31.7 % (ref 12.0–46.0)
Lymphs Abs: 2.9 10*3/uL (ref 0.7–4.0)
MCHC: 32.4 g/dL (ref 30.0–36.0)
MCV: 80.2 fl (ref 78.0–100.0)
Monocytes Absolute: 0.6 10*3/uL (ref 0.1–1.0)
Monocytes Relative: 7.1 % (ref 3.0–12.0)
Neutro Abs: 5.4 10*3/uL (ref 1.4–7.7)
Neutrophils Relative %: 58.6 % (ref 43.0–77.0)
Platelets: 358 10*3/uL (ref 150.0–400.0)
RBC: 3.46 Mil/uL — ABNORMAL LOW (ref 3.87–5.11)
RDW: 15.5 % (ref 11.5–15.5)
WBC: 9.1 10*3/uL (ref 4.0–10.5)

## 2015-10-30 LAB — LIPID PANEL
Cholesterol: 166 mg/dL (ref 0–200)
HDL: 30.4 mg/dL — ABNORMAL LOW (ref >39.00)
LDL Cholesterol: 110 mg/dL — ABNORMAL HIGH (ref 0–99)
NonHDL: 135.5
Total CHOL/HDL Ratio: 5
Triglycerides: 127 mg/dL (ref 0.0–149.0)
VLDL: 25.4 mg/dL (ref 0.0–40.0)

## 2015-10-30 LAB — TSH: TSH: 1.87 u[IU]/mL (ref 0.35–4.50)

## 2015-10-30 MED ORDER — BUPROPION HCL ER (SR) 100 MG PO TB12: 100.0000 mg | ORAL_TABLET | Freq: Two times a day (BID) | ORAL | Status: DC

## 2015-10-30 MED ORDER — SERTRALINE HCL 50 MG PO TABS: 50.0000 mg | ORAL_TABLET | Freq: Every day | ORAL | Status: DC

## 2015-10-30 MED ORDER — OXYCODONE-ACETAMINOPHEN 5-325 MG PO TABS: 1.0000 | ORAL_TABLET | Freq: Three times a day (TID) | ORAL | Status: DC | PRN

## 2015-10-30 NOTE — Assessment & Plan Note (Signed)
The patient was counseled on the dangers of tobacco use, and was advised to quit. Reviewed strategies to maximize success, including removing cigarettes and smoking materials from environment.  eRx sent for Wellbutrin 100 mg twice daily.

## 2015-10-30 NOTE — Assessment & Plan Note (Signed)
Prolonged duration in a smoker. Lung exam clear. CXR today. The patient indicates understanding of these issues and agrees with the plan.

## 2015-10-30 NOTE — Patient Instructions (Addendum)
Great to see you. We are increasing your zoloft 50 mg daily- ok to take two of 25 mg tablets until you run out.  Percocet as needed for severe pain- this will make you sleepy.  DO NOT drive after taking it.  Keep your appointment with your dentist.  I will call you with your xray and lab results from today. We are also starting Wellbutrin.

## 2015-10-30 NOTE — Progress Notes (Signed)
Subjective:   Patient ID: Catherine Gilbert, female    DOB: 21-Oct-1975, 40 y.o.   MRN: [id number]  Catherine Gilbert is a pleasant 40 y.o. year old female who presents to clinic today with Annual Exam  and follow up of chronic medical conditions on 10/30/2015  HPI:  [id number] on depo provera.  Last pap smear was on 10/23/14.  No h/o abnormal pap smears in the past 5 years.  Mammogram 05/04/15.  HTN- has been treated for HTN since she was in her early 25s.Denies any HA, blurred vision, CP or SOB. Has been well controlled on Lisinopril. Lab Results  Component Value Date   CREATININE 0.96 10/23/2014   BP Readings from Last 3 Encounters:  10/30/15 130/80  10/23/14 118/82  05/29/14 112/68   Tooth ache- lost a filling last month. Left molar in severe pain.  Has a dental appointment on 5/12.  Anxiety-symptoms have deteriorated over the past month, previously well controlled with low dose zoloft and xanax. Denies feeling depression but does feel "more negative" and "anxious." Sleeping ok. No SI or HI.  Tobacco abuse- she is ready to quit smoking but insurance would not pay for chantix. Patches have not worked for her in past.  She is having more SOB at night. Denies hemoptysis.  Does have "smokers cough" in the morning.  GERD- feels her symptoms are well controlled with prn omeprazole.  Current Outpatient Prescriptions on File Prior to Visit  Medication Sig Dispense Refill  . ALPRAZolam (XANAX) 0.5 MG tablet Take 1 tablet (0.5 mg total) by mouth at bedtime as needed. 30 tablet 0  . lisinopril (PRINIVIL,ZESTRIL) 10 MG tablet Take 1 tablet (10 mg total) by mouth daily. 90 tablet 3  . omeprazole (PRILOSEC) 20 MG capsule Take 1 capsule (20 mg total) by mouth daily. 90 capsule 3  . valACYclovir (VALTREX) 500 MG tablet TAKE 2 TABLETS BY MOUTH TODAY THEN TAKE 2 TABLET BY MOUTH TWICE DAILY AS NEEDED 60 tablet 0   No current facility-administered medications on file prior to visit.    No  Known Allergies  Past Medical History  Diagnosis Date  . Hypertension   . Depression   . Anxiety   . HSV (herpes simplex virus) infection   . Abnormal Pap smear     Past Surgical History  Procedure Laterality Date  . Cesarean section      x 3   . Tubal ligation    . Dilation and curettage of uterus    . Colposcopy  2010    Family History  Problem Relation Age of Onset  . Stroke Mother 59    massive stroke  . Heart disease Father 56    congestive heart failure  . Hypertension Father   . Cancer Maternal Grandmother     breast cancer  . Breast cancer Maternal Grandmother 25  . Breast cancer Cousin     Social History   Social History  . Marital Status: Married    Spouse Name: N/A  . Number of Children: N/A  . Years of Education: N/A   Occupational History  . Not on file.   Social History Main Topics  . Smoking status: Current Every Day Smoker -- 20 years    Types: Cigarettes  . Smokeless tobacco: Never Used     Comment: down to 1-2 cigarettes per day  . Alcohol Use: No  . Drug Use: No  . Sexual Activity: Yes    Birth Control/ Protection: Surgical  Comment: tubalization   Other Topics Concern  . Not on file   Social History Narrative   The PMH, PSH, Social History, Family History, Medications, and allergies have been reviewed in [facility], and have been updated if relevant.  Review of Systems  Constitutional: Negative.   HENT: Negative.  Negative for trouble swallowing.        +tooth pain  Eyes: Negative.   Respiratory: Positive for cough and shortness of breath.   Cardiovascular: Negative.   Gastrointestinal: Negative.   Endocrine: Negative.   Genitourinary: Negative.   Musculoskeletal: Negative.   Skin: Negative.   Allergic/Immunologic: Negative.   Neurological: Negative.   Hematological: Negative.   Psychiatric/Behavioral: Positive for sleep disturbance. Negative for suicidal ideas, behavioral problems, self-injury, dysphoric mood, decreased  concentration and agitation. The patient is nervous/anxious.   All other systems reviewed and are negative.      Physical exam:   BP 130/80 mmHg  Pulse 79  Temp(Src) 98.6 F (37 C) (Oral)  Ht 5' 4.5" (1.638 m)  Wt 155 lb 4 oz (70.421 kg)  BMI 26.25 kg/m2  LMP 10/24/2015   Physical Exam  Constitutional: She is oriented to person, place, and time. She appears well-developed and well-nourished. No distress.  HENT:  Head: Normocephalic and atraumatic.  Eyes: Conjunctivae are normal.  Neck: Normal range of motion.  Cardiovascular: Normal rate.   Pulmonary/Chest: Effort normal.  Abdominal: Soft. Bowel sounds are normal.  Neurological: She is alert and oriented to person, place, and time.  Skin: Skin is warm and dry.  Psychiatric: She has a normal mood and affect. Her behavior is normal. Judgment and thought content normal.  Nursing note and vitals reviewed.         Assessment & Plan:   Encounter for general adult medical examination with abnormal findings - Plan: CBC with Differential/Platelet, Comprehensive metabolic panel, Lipid panel, TSH  Well woman exam with routine gynecological exam  Tobacco user  Essential hypertension  GAD (generalized anxiety disorder)  Tooth ache  Well woman exam No Follow-up on file.

## 2015-10-30 NOTE — Assessment & Plan Note (Signed)
Has appt scheduled with dentist. Rx given for percocet until she can be seen by her dentist.

## 2015-10-30 NOTE — Assessment & Plan Note (Signed)
Deteriorated. Increase zoloft to 50 mg daily, add low dose Wellbutrin for anxiety and smoking cessation.

## 2015-10-30 NOTE — Progress Notes (Signed)
Pre visit review using our clinic review tool, if applicable. No additional management support is needed unless otherwise documented below in the visit note. 

## 2015-10-31 ENCOUNTER — Encounter: Payer: Self-pay | Admitting: Family Medicine

## 2015-10-31 ENCOUNTER — Other Ambulatory Visit: Payer: Self-pay | Admitting: Family Medicine

## 2015-10-31 MED ORDER — FERROUS SULFATE 325 (65 FE) MG PO TABS: 325.0000 mg | ORAL_TABLET | Freq: Two times a day (BID) | ORAL | Status: DC

## 2015-11-07 ENCOUNTER — Other Ambulatory Visit: Payer: Self-pay | Admitting: Family Medicine

## 2015-11-08 MED ORDER — ALPRAZOLAM 0.5 MG PO TABS: 0.5000 mg | ORAL_TABLET | Freq: Every evening | ORAL | Status: DC | PRN

## 2015-11-08 NOTE — Telephone Encounter (Signed)
Rx faxed to requested pharmacy 

## 2015-11-08 NOTE — Telephone Encounter (Signed)
Last f/u 10/2015-CPE

## 2015-11-12 ENCOUNTER — Encounter: Payer: Self-pay | Admitting: Family Medicine

## 2015-12-03 ENCOUNTER — Encounter: Payer: Self-pay | Admitting: Family Medicine

## 2015-12-29 ENCOUNTER — Other Ambulatory Visit: Payer: Self-pay | Admitting: Family Medicine

## 2015-12-31 MED ORDER — ALPRAZOLAM 0.5 MG PO TABS: 0.5000 mg | ORAL_TABLET | Freq: Every evening | ORAL | Status: DC | PRN

## 2015-12-31 NOTE — Telephone Encounter (Signed)
Rx faxed to requested pharmacy 

## 2015-12-31 NOTE — Telephone Encounter (Signed)
Last f/u 10/2015-CPE

## 2016-01-24 ENCOUNTER — Encounter: Payer: Self-pay | Admitting: Family Medicine

## 2016-02-25 ENCOUNTER — Other Ambulatory Visit: Payer: Self-pay | Admitting: Family Medicine

## 2016-02-25 MED ORDER — ALPRAZOLAM 0.5 MG PO TABS: 0.5000 mg | ORAL_TABLET | Freq: Every evening | ORAL | 0 refills | Status: DC | PRN

## 2016-02-25 NOTE — Telephone Encounter (Signed)
Rx faxed to requested pharmacy 

## 2016-02-25 NOTE — Telephone Encounter (Signed)
Last f/u 10/2015-CPE

## 2016-03-01 ENCOUNTER — Other Ambulatory Visit: Payer: Self-pay | Admitting: Family Medicine

## 2016-03-05 ENCOUNTER — Encounter: Payer: Self-pay | Admitting: Family Medicine

## 2016-03-06 MED ORDER — FERROUS SULFATE 325 (65 FE) MG PO TABS: 325.0000 mg | ORAL_TABLET | Freq: Two times a day (BID) | ORAL | 3 refills | Status: DC

## 2016-03-06 MED ORDER — SERTRALINE HCL 50 MG PO TABS: 50.0000 mg | ORAL_TABLET | Freq: Every day | ORAL | 3 refills | Status: DC

## 2016-03-19 ENCOUNTER — Encounter: Payer: Self-pay | Admitting: Family Medicine

## 2016-03-19 MED ORDER — LISINOPRIL 10 MG PO TABS: 10.0000 mg | ORAL_TABLET | Freq: Every day | ORAL | 2 refills | Status: DC

## 2016-03-19 MED ORDER — LISINOPRIL 10 MG PO TABS: 10.0000 mg | ORAL_TABLET | Freq: Every day | ORAL | 0 refills | Status: DC

## 2016-04-14 ENCOUNTER — Other Ambulatory Visit: Payer: Self-pay | Admitting: Obstetrics and Gynecology

## 2016-04-16 ENCOUNTER — Encounter: Payer: Self-pay | Admitting: Family Medicine

## 2016-04-16 MED ORDER — VALACYCLOVIR HCL 500 MG PO TABS: ORAL_TABLET | ORAL | 3 refills | Status: DC

## 2016-05-26 ENCOUNTER — Other Ambulatory Visit: Payer: Self-pay | Admitting: Family Medicine

## 2016-05-27 MED ORDER — ALPRAZOLAM 0.5 MG PO TABS: 0.5000 mg | ORAL_TABLET | Freq: Every evening | ORAL | 0 refills | Status: DC | PRN

## 2016-05-27 NOTE — Telephone Encounter (Signed)
Rx faxed to requested pharmacy 

## 2016-05-27 NOTE — Telephone Encounter (Signed)
Last f/u 10/2015-CPE

## 2016-07-22 ENCOUNTER — Encounter: Payer: Self-pay | Admitting: Family Medicine

## 2016-07-31 ENCOUNTER — Other Ambulatory Visit: Payer: Self-pay | Admitting: Family Medicine

## 2016-08-01 MED ORDER — ALPRAZOLAM 0.5 MG PO TABS
0.5000 mg | ORAL_TABLET | Freq: Every evening | ORAL | 0 refills | Status: DC | PRN
Start: 2016-08-01 — End: 2016-08-04

## 2016-08-01 NOTE — Telephone Encounter (Signed)
Last f/u 10/2015

## 2016-08-04 ENCOUNTER — Encounter: Payer: Self-pay | Admitting: Family Medicine

## 2016-08-04 MED ORDER — ALPRAZOLAM 0.5 MG PO TABS: 0.5000 mg | ORAL_TABLET | Freq: Every evening | ORAL | 0 refills | Status: DC | PRN

## 2016-08-04 NOTE — Telephone Encounter (Signed)
Rx called in to requested pharmacy 

## 2016-08-20 ENCOUNTER — Other Ambulatory Visit: Payer: Self-pay | Admitting: Family Medicine

## 2016-08-20 DIAGNOSIS — Z1231 Encounter for screening mammogram for malignant neoplasm of breast: Secondary | ICD-10-CM

## 2016-09-15 ENCOUNTER — Ambulatory Visit
Admission: RE | Admit: 2016-09-15 | Discharge: 2016-09-15 | Disposition: A | Discharge status: Home / Self Care | Payer: BLUE CROSS/BLUE SHIELD | Source: Ambulatory Visit | Attending: Family Medicine | Admitting: Family Medicine

## 2016-09-15 DIAGNOSIS — Z1231 Encounter for screening mammogram for malignant neoplasm of breast: Secondary | ICD-10-CM | POA: Diagnosis not present

## 2016-10-10 ENCOUNTER — Encounter: Payer: Self-pay | Admitting: Family Medicine

## 2016-10-10 ENCOUNTER — Other Ambulatory Visit: Payer: Self-pay | Admitting: Family Medicine

## 2016-10-10 MED ORDER — ALPRAZOLAM 0.5 MG PO TABS: 0.5000 mg | ORAL_TABLET | Freq: Every evening | ORAL | 0 refills | Status: DC | PRN

## 2016-10-10 NOTE — Telephone Encounter (Signed)
Rx called in to pharmacy. 

## 2016-10-10 NOTE — Telephone Encounter (Signed)
Last faxed to mail order 08-04-16 #30 Last OV 10-30-15 CPE No Future OV  Forward to Dr Silvio Pate in Dr Hulen Shouts absence

## 2016-10-10 NOTE — Telephone Encounter (Signed)
From: Gretta Began. Theadora Rama: 4/13/20189:51 AM EDT  To: Arnette Norris, MD Subject: Non-Urgent Medical Question  Good morning. I sent a request for Xanax last night. I want to see if [facility] [place] will refill it. My brother passed away unexpectedly last night and [facility]'t [facility] them. Thank you!!           Called into [facility] in [place]

## 2016-10-10 NOTE — Telephone Encounter (Signed)
Which local pharmacy?

## 2016-10-10 NOTE — Telephone Encounter (Signed)
Approved: 30 x 0 

## 2016-10-10 NOTE — Telephone Encounter (Signed)
THANK YOU. I never saw this message. I called [facility] to cancel the rx I faxed, but they said it would be 24-48 hours before they received it. They put a note in the system not to block the rx at the local pharmacy and to hopefully cancel the one that comes in from the fax.

## 2016-11-04 ENCOUNTER — Encounter: Payer: BLUE CROSS/BLUE SHIELD | Admitting: Family Medicine

## 2016-11-11 ENCOUNTER — Ambulatory Visit (INDEPENDENT_AMBULATORY_CARE_PROVIDER_SITE_OTHER): Payer: BLUE CROSS/BLUE SHIELD | Admitting: Family Medicine

## 2016-11-11 ENCOUNTER — Encounter: Payer: Self-pay | Admitting: Family Medicine

## 2016-11-11 VITALS — BP 120/80 | HR 71 | Ht 64.5 in | Wt 163.0 lb

## 2016-11-11 DIAGNOSIS — I10 Essential (primary) hypertension: Secondary | ICD-10-CM | POA: Diagnosis not present

## 2016-11-11 DIAGNOSIS — Z0001 Encounter for general adult medical examination with abnormal findings: Secondary | ICD-10-CM

## 2016-11-11 DIAGNOSIS — Z8249 Family history of ischemic heart disease and other diseases of the circulatory system: Secondary | ICD-10-CM

## 2016-11-11 DIAGNOSIS — F411 Generalized anxiety disorder: Secondary | ICD-10-CM

## 2016-11-11 DIAGNOSIS — K219 Gastro-esophageal reflux disease without esophagitis: Secondary | ICD-10-CM

## 2016-11-11 DIAGNOSIS — Z01419 Encounter for gynecological examination (general) (routine) without abnormal findings: Secondary | ICD-10-CM

## 2016-11-11 HISTORY — DX: Family history of ischemic heart disease and other diseases of the circulatory system: Z82.49

## 2016-11-11 LAB — COMPREHENSIVE METABOLIC PANEL
ALT: 17 U/L (ref 0–35)
AST: 17 U/L (ref 0–37)
Albumin: 4.7 g/dL (ref 3.5–5.2)
Alkaline Phosphatase: 32 U/L — ABNORMAL LOW (ref 39–117)
BUN: 12 mg/dL (ref 6–23)
CO2: 29 mEq/L (ref 19–32)
Calcium: 10 mg/dL (ref 8.4–10.5)
Chloride: 103 mEq/L (ref 96–112)
Creatinine, Ser: 0.88 mg/dL (ref 0.40–1.20)
GFR: 75.07 mL/min (ref >60.00)
Glucose, Bld: 92 mg/dL (ref 70–99)
Potassium: 3.8 mEq/L (ref 3.5–5.1)
Sodium: 138 mEq/L (ref 135–145)
Total Bilirubin: 0.3 mg/dL (ref 0.2–1.2)
Total Protein: 7.2 g/dL (ref 6.0–8.3)

## 2016-11-11 LAB — LIPID PANEL
Cholesterol: 152 mg/dL (ref 0–200)
HDL: 38.5 mg/dL — ABNORMAL LOW (ref >39.00)
LDL Cholesterol: 85 mg/dL (ref 0–99)
NonHDL: 113.34
Total CHOL/HDL Ratio: 4
Triglycerides: 144 mg/dL (ref 0.0–149.0)
VLDL: 28.8 mg/dL (ref 0.0–40.0)

## 2016-11-11 LAB — CBC WITH DIFFERENTIAL/PLATELET
Basophils Absolute: 0 10*3/uL (ref 0.0–0.1)
Basophils Relative: 0.4 % (ref 0.0–3.0)
Eosinophils Absolute: 0.1 10*3/uL (ref 0.0–0.7)
Eosinophils Relative: 0.9 % (ref 0.0–5.0)
HCT: 40.1 % (ref 36.0–46.0)
Hemoglobin: 13.7 g/dL (ref 12.0–15.0)
Lymphocytes Relative: 29.9 % (ref 12.0–46.0)
Lymphs Abs: 3.2 10*3/uL (ref 0.7–4.0)
MCHC: 34.2 g/dL (ref 30.0–36.0)
MCV: 92.9 fl (ref 78.0–100.0)
Monocytes Absolute: 0.8 10*3/uL (ref 0.1–1.0)
Monocytes Relative: 7.7 % (ref 3.0–12.0)
Neutro Abs: 6.5 10*3/uL (ref 1.4–7.7)
Neutrophils Relative %: 61.1 % (ref 43.0–77.0)
Platelets: 249 10*3/uL (ref 150.0–400.0)
RBC: 4.32 Mil/uL (ref 3.87–5.11)
RDW: 12.4 % (ref 11.5–15.5)
WBC: 10.6 10*3/uL — ABNORMAL HIGH (ref 4.0–10.5)

## 2016-11-11 LAB — TSH: TSH: 1.67 u[IU]/mL (ref 0.35–4.50)

## 2016-11-11 MED ORDER — SERTRALINE HCL 100 MG PO TABS: 100.0000 mg | ORAL_TABLET | Freq: Every day | ORAL | 3 refills | Status: DC

## 2016-11-11 NOTE — Assessment & Plan Note (Signed)
Lipid panel today. She has quit smoking.  Refer to cardiology for further risk statification- ie stress test.  The patient indicates understanding of these issues and agrees with the plan.

## 2016-11-11 NOTE — Progress Notes (Signed)
Subjective:   Patient ID: Catherine Gilbert, female    DOB: 18-Feb-1976, 41 y.o.   MRN: [id number]  Catherine Gilbert is a pleasant 41 y.o. year old female who presents to clinic today with Annual Exam  on 11/11/2016  HPI: [id number] on depo provera.  Last pap smear was on 10/23/14.  No h/o abnormal pap smears in the past 5 years.  Mammogram 09/15/16  Unfortunately her brother passed away- sudden death at the age of 23.  HTN- has been treated for HTN since she was in her early 67s.Denies any HA, blurred vision, CP or SOB. Has been well controlled on Lisinopril.  Lab Results  Component Value Date   CREATININE 0.82 10/30/2015   BP Readings from Last 3 Encounters:  11/11/16 120/80  10/30/15 130/80  10/23/14 118/82     Anxiety- feels it has deteriorated since her brother died.  Not taking xanax as often. Denies SI or HI.   GERD- feels her symptoms are well controlled with prn omeprazole and since she quit smoking.  Current Outpatient Prescriptions on File Prior to Visit  Medication Sig Dispense Refill  . ALPRAZolam (XANAX) 0.5 MG tablet Take 1 tablet (0.5 mg total) by mouth at bedtime as needed. 30 tablet 0  . buPROPion (WELLBUTRIN SR) 100 MG 12 hr tablet Take 1 tablet (100 mg total) by mouth 2 (two) times daily. 60 tablet 2  . ferrous sulfate 325 (65 FE) MG tablet Take 1 tablet (325 mg total) by mouth 2 (two) times daily with a meal. 180 tablet 3  . lisinopril (PRINIVIL,ZESTRIL) 10 MG tablet Take 1 tablet (10 mg total) by mouth daily. 90 tablet 2  . omeprazole (PRILOSEC) 20 MG capsule TAKE 1 CAPSULE DAILY 90 capsule 3  . oxyCODONE-acetaminophen (ROXICET) 5-325 MG tablet Take 1 tablet by mouth every 8 (eight) hours as needed for severe pain. 20 tablet 0  . sertraline (ZOLOFT) 50 MG tablet Take 1 tablet (50 mg total) by mouth daily. 90 tablet 3  . valACYclovir (VALTREX) 500 MG tablet Take 2 tablets by mouth today, then take 2 tablets by mouth twice daily as needed 60 tablet 3    No current facility-administered medications on file prior to visit.     No Known Allergies  Past Medical History:  Diagnosis Date  . Abnormal Pap smear   . Anxiety   . Depression   . HSV (herpes simplex virus) infection   . Hypertension     Past Surgical History:  Procedure Laterality Date  . CESAREAN SECTION     x 3   . COLPOSCOPY  2010  . DILATION AND CURETTAGE OF UTERUS    . TUBAL LIGATION      Family History  Problem Relation Age of Onset  . Stroke Mother 9       massive stroke  . Heart disease Father 48       congestive heart failure  . Hypertension Father   . Breast cancer Maternal Grandmother 48  . Breast cancer Cousin        x2 paternal    Social History   Social History  . Marital status: Married    Spouse name: N/A  . Number of children: N/A  . Years of education: N/A   Occupational History  . Not on file.   Social History Main Topics  . Smoking status: Current Every Day Smoker    Years: 20.00    Types: Cigarettes  . Smokeless tobacco: Never Used  Comment: down to 1-2 cigarettes per day  . Alcohol use No  . Drug use: No  . Sexual activity: Yes    Birth control/ protection: Surgical     Comment: tubalization   Other Topics Concern  . Not on file   Social History Narrative  . No narrative on file   The PMH, PSH, Social History, Family History, Medications, and allergies have been reviewed in [facility], and have been updated if relevant.   Review of Systems  Constitutional: Negative.   HENT: Negative.   Respiratory: Negative.   Cardiovascular: Negative.   Gastrointestinal: Negative.   Endocrine: Negative.   Genitourinary: Negative.   Musculoskeletal: Negative.   Allergic/Immunologic: Negative.   Neurological: Negative.   Hematological: Negative.   Psychiatric/Behavioral: Positive for dysphoric mood. Negative for self-injury. The patient is nervous/anxious.   All other systems reviewed and are negative.      Objective:     BP 120/80   Pulse 71   Ht 5' 4.5" (1.638 m)   Wt 163 lb (73.9 kg)   LMP 10/27/2016   SpO2 99%   BMI 27.55 kg/m    Physical Exam   General:  Well-developed,well-nourished,in no acute distress; alert,appropriate and cooperative throughout examination Head:  normocephalic and atraumatic.   Eyes:  vision grossly intact, PERRL Ears:  R ear normal and L ear normal externally, TMs clear bilaterally Nose:  no external deformity.   Mouth:  good dentition.   Neck:  No deformities, masses, or tenderness noted. Lungs:  Normal respiratory effort, chest expands symmetrically. Lungs are clear to auscultation, no crackles or wheezes. Heart:  Normal rate and regular rhythm. S1 and S2 normal without gallop, murmur, click, rub or other extra sounds. Abdomen:  Bowel sounds positive,abdomen soft and non-tender without masses, organomegaly or hernias noted. Msk:  No deformity or scoliosis noted of thoracic or lumbar spine.   Extremities:  No clubbing, cyanosis, edema, or deformity noted with normal full range of motion of all joints.   Neurologic:  alert & oriented X3 and gait normal.   Skin:  Intact without suspicious lesions or rashes Cervical Nodes:  No lymphadenopathy noted Axillary Nodes:  No palpable lymphadenopathy Psych:  Cognition and judgment appear intact. Alert and cooperative with normal attention span and concentration. No apparent delusions, illusions, hallucinations       Assessment & Plan:   Well woman exam  Essential hypertension No Follow-up on file.

## 2016-11-11 NOTE — Assessment & Plan Note (Addendum)
Reviewed preventive care protocols, scheduled due services, and updated immunizations Discussed nutrition, exercise, diet, and healthy lifestyle.  Orders Placed This Encounter  Procedures  . CBC with Differential/Platelet  . Comprehensive metabolic panel  . Lipid panel  . TSH  . Ambulatory referral to Cardiology

## 2016-11-11 NOTE — Assessment & Plan Note (Signed)
Deteriorated. Increase zoloft to 100 mg daily. Call or return to clinic prn if these symptoms worsen or fail to improve as anticipated. The patient indicates understanding of these issues and agrees with the plan.

## 2016-11-11 NOTE — Patient Instructions (Signed)
Great to see you.  I am sorry for your loss.  We are increasing your zoloft to your 100 mg daily. Please keep me updated.  We are referring you to a cardiologist- please stop by to see Rosaria Ferries on your way out.  I will call you with your lab results and you can view them online.

## 2016-11-11 NOTE — Assessment & Plan Note (Signed)
Well controlled on current rx. No changes made. 

## 2016-11-14 ENCOUNTER — Other Ambulatory Visit: Payer: Self-pay | Admitting: Family Medicine

## 2016-11-14 NOTE — Telephone Encounter (Signed)
Last refill 11/11/16 #30+3, last OV 11/11/16.

## 2016-11-17 NOTE — Telephone Encounter (Signed)
Sertraline increased to 100 mg tablet.  Rx sent to [facility] in [place].

## 2016-11-17 NOTE — Addendum Note (Signed)
Addended by: Carter Kitten on: 11/17/2016 03:08 PM   Modules accepted: Orders

## 2016-11-23 ENCOUNTER — Other Ambulatory Visit: Payer: Self-pay | Admitting: Family Medicine

## 2016-12-24 ENCOUNTER — Encounter: Payer: Self-pay | Admitting: Family Medicine

## 2016-12-29 ENCOUNTER — Ambulatory Visit: Payer: Self-pay | Admitting: Family Medicine

## 2016-12-30 ENCOUNTER — Ambulatory Visit (INDEPENDENT_AMBULATORY_CARE_PROVIDER_SITE_OTHER): Payer: BLUE CROSS/BLUE SHIELD | Admitting: Internal Medicine

## 2016-12-30 ENCOUNTER — Encounter: Payer: Self-pay | Admitting: Internal Medicine

## 2016-12-30 VITALS — BP 148/90 | HR 87 | Ht 64.0 in | Wt 164.0 lb

## 2016-12-30 DIAGNOSIS — R0609 Other forms of dyspnea: Secondary | ICD-10-CM | POA: Diagnosis not present

## 2016-12-30 DIAGNOSIS — R0600 Dyspnea, unspecified: Secondary | ICD-10-CM

## 2016-12-30 DIAGNOSIS — Z8249 Family history of ischemic heart disease and other diseases of the circulatory system: Secondary | ICD-10-CM | POA: Diagnosis not present

## 2016-12-30 DIAGNOSIS — I10 Essential (primary) hypertension: Secondary | ICD-10-CM

## 2016-12-30 NOTE — Progress Notes (Signed)
New Outpatient Visit Date: 12/30/2016  Referring Provider: Lucille Passy, MD [place], [place] [address]  Chief Complaint: Family history of coronary artery disease  HPI:  Catherine Gilbert is a 41 y.o. female who is being seen today for the evaluation of cardiovascular risk given family history of atherosclerotic cardiovascular disease at the request of Dr. Deborra Medina. She has a history of hypertension since her 62s, migraine headaches, depression, and anxiety. Ms. Muffley is primarily concerned about her cardiac risk due to her brother passing away 3 months ago at age 56. He had been experiencing intermittent chest pain and collapsed in his driveway. It is presumed that he died from a primary cardiac even. Her mother also suffered a stroke and may have had heart disease prior to her death. Her father has a history of congestive heart failure. Ms. Lindell has been largely asymptomatic, denying chest pain. She has some exertional dyspnea and also feels a fluttering in her throat when she becomes anxious. This fluttering sensation has improved with the recent addition of sertraline. She denies lightheadedness, edema, orthopnea, and PND. She has started exercising (walking and running) but has noted some exertional dyspnea, as above.  Ms. Riesgo has not undergone prior cardiovascular testing. She does not have a known history of cardiac disease. She intermittently checks her blood pressures at home, and notes that they are typically around 140/80. She consumes one can of caffeinated soda per day, though she previously consumed quite a bit more. She stopped smoking about a year ago.  --------------------------------------------------------------------------------------------------  Cardiovascular History & Procedures: Cardiovascular Problems:  Dyspnea on exertion  Family history of premature atherosclerotic cardiovascular disease  Risk Factors:  Hypertension, history of tobacco use, and family  history of premature ASCVD  Cath/PCI:  None  CV Surgery:  None  EP Procedures and Devices:  None  Non-Invasive Evaluation(s):  None  Recent CV Pertinent Labs: Lab Results  Component Value Date   CHOL 152 11/11/2016   HDL 38.50 (L) 11/11/2016   LDLCALC 85 11/11/2016   TRIG 144.0 11/11/2016   CHOLHDL 4 11/11/2016   K 3.8 11/11/2016   BUN 12 11/11/2016   CREATININE 0.88 11/11/2016   CREATININE 0.76 04/23/2011    --------------------------------------------------------------------------------------------------  Past Medical History:  Diagnosis Date  . Abnormal Pap smear   . Anxiety   . Depression   . HSV (herpes simplex virus) infection   . Hypertension   . Migraine headache     Past Surgical History:  Procedure Laterality Date  . CESAREAN SECTION     x 3   . COLPOSCOPY  2010  . DILATION AND CURETTAGE OF UTERUS    . TUBAL LIGATION      Current Meds  Medication Sig  . ALPRAZolam (XANAX) 0.5 MG tablet Take 1 tablet (0.5 mg total) by mouth at bedtime as needed.  . ferrous sulfate 325 (65 FE) MG tablet Take 1 tablet (325 mg total) by mouth 2 (two) times daily with a meal.  . lisinopril (PRINIVIL,ZESTRIL) 10 MG tablet TAKE 1 TABLET DAILY  . omeprazole (PRILOSEC) 20 MG capsule TAKE 1 CAPSULE DAILY  . sertraline (ZOLOFT) 100 MG tablet Take 1 tablet (100 mg total) by mouth daily.  . valACYclovir (VALTREX) 500 MG tablet Take 2 tablets by mouth today, then take 2 tablets by mouth twice daily as needed    Allergies: Patient has no known allergies.  Social History   Social History  . Marital status: Married    Spouse name:  N/A  . Number of children: N/A  . Years of education: N/A   Occupational History  . Not on file.   Social History Main Topics  . Smoking status: Former Smoker    Packs/day: 1.00    Years: 25.00    Types: Cigarettes    Quit date: 11/22/2015  . Smokeless tobacco: Never Used     Comment: down to 1-2 cigarettes per day  . Alcohol use  No  . Drug use: No  . Sexual activity: Yes    Birth control/ protection: Surgical     Comment: tubalization   Other Topics Concern  . Not on file   Social History Narrative  . No narrative on file    Family History  Problem Relation Age of Onset  . Stroke Mother 48       massive stroke  . Heart disease Father 65       congestive heart failure  . Hypertension Father   . Heart disease Brother 67       Suspected MI and SCD  . Heart disease Sister   . Stroke Sister   . Breast cancer Maternal Grandmother 56  . Breast cancer Cousin        x2 paternal    Review of Systems: A 12-system review of systems was performed and was negative except as noted in the HPI.  --------------------------------------------------------------------------------------------------  Physical Exam: BP (!) 148/90 (BP Location: Right Arm, Patient Position: Sitting, Cuff Size: Normal)   Pulse 87   Ht 5\' 4"  (1.626 m)   Wt 164 lb (74.4 kg)   BMI 28.15 kg/m   General:  Well-developed, well-nourished woman, seated comfortably in the exam room. She is accompanied by her husband and daughter. HEENT: No conjunctival pallor or scleral icterus. Moist mucous membranes. OP clear. Neck: Supple without lymphadenopathy, thyromegaly, JVD, or HJR. No carotid bruit. Lungs: Normal work of breathing. Clear to auscultation bilaterally without wheezes or crackles. Heart: Regular rate and rhythm without murmurs, rubs, or gallops. Non-displaced PMI. Abd: Bowel sounds present. Soft, NT/ND without hepatosplenomegaly Ext: No lower extremity edema. Radial, PT, and DP pulses are 2+ bilaterally Skin: Warm and dry without rash. Neuro: CNIII-XII intact. Strength and fine-touch sensation intact in upper and lower extremities bilaterally. Psych: Patient notes intermittent anxiety. Affect is appropriate.  EKG: NSR without abnormalities.  Lab Results  Component Value Date   WBC 10.6 (H) 11/11/2016   HGB 13.7 11/11/2016   HCT  40.1 11/11/2016   MCV 92.9 11/11/2016   PLT 249.0 11/11/2016    Lab Results  Component Value Date   NA 138 11/11/2016   K 3.8 11/11/2016   CL 103 11/11/2016   CO2 29 11/11/2016   BUN 12 11/11/2016   CREATININE 0.88 11/11/2016   GLUCOSE 92 11/11/2016   ALT 17 11/11/2016    Lab Results  Component Value Date   CHOL 152 11/11/2016   HDL 38.50 (L) 11/11/2016   LDLCALC 85 11/11/2016   TRIG 144.0 11/11/2016   CHOLHDL 4 11/11/2016    --------------------------------------------------------------------------------------------------  ASSESSMENT AND PLAN: Dyspnea on exertion and family history of premature atherosclerotic cardiovascular disease Overall, Ms. Kimberlin's symptoms are minimal. However, she has a strong history of cardiovascular disease, particularly in her brother who recently passed away from a presumed MI with sudden cardiac death in his early 42s. Her predominant symptom is exertional dyspnea, which she has noticed since she recently began exercising more. Her EKG and exam today are normal. Recent labs are notable for  a good LDL of 85 and a slightly low HDL at 39. Cardiovascular risk factors include hypertension, tobacco use up until one year ago, and family history. We have discussed the natural history of ASCVD and have agreed to perform an exercise tolerance test to further evaluate her exertional dyspnea. If there is no evidence of ischemia, we will proceed with coronary calcium scoring for further risk stratification of future coronary events. If the ETT is abnormal, we will need to consider additional ischemia evaluation. In the meantime, I think it is reasonable to continue with primary prevention, including blood pressure control and lifestyle modifications.  Hypertension Blood pressure is suboptimally controlled today, though prior readings as recently as 10/2016 were normal. We will not make any medication changes today. I had we will defer further management to Dr.  Deborra Medina.  Follow-up: Return to clinic in 6 weeks.  Nelva Bush, MD 12/31/2016 8:13 AM

## 2016-12-30 NOTE — Patient Instructions (Addendum)
Testing/Procedures: Your physician has requested that you have an exercise tolerance test. For further information please visit [email or web address]. Please also follow instruction sheet, as given.   Do not drink or eat foods with caffeine for 24 hours before the test. (Chocolate, coffee, tea, or energy drinks)  If you use an inhaler, bring it with you to the test.  Do not smoke for 4 hours before the test.  Wear comfortable shoes and clothing.  Follow-Up: Your physician recommends that you schedule a follow-up appointment in: 6 weeks.  It was a pleasure seeing you today here in the office. Please do not hesitate to give [place] a call back if you have any further questions. [place], BSN     Exercise Stress Electrocardiogram An exercise stress electrocardiogram is a test to check how blood flows to your heart. It is done to find areas of poor blood flow. You will need to walk on a treadmill for this test. The electrocardiogram will record your heartbeat when you are at rest and when you are exercising. What happens before the procedure?  Do not have drinks with caffeine or foods with caffeine for 24 hours before the test, or as told by your doctor. This includes coffee, tea (even decaf tea), sodas, chocolate, and cocoa.  Follow your doctor's instructions about eating and drinking before the test.  Ask your doctor what medicines you should or should not take before the test. Take your medicines with water unless told by your doctor not to.  If you use an inhaler, bring it with you to the test.  Bring a snack to eat after the test.  Do not  smoke for 4 hours before the test.  Do not put lotions, powders, creams, or oils on your chest before the test.  Wear comfortable shoes and clothing. What happens during the procedure?  You will have patches put on your chest. Small areas of your chest may need to be shaved. Wires will be connected to the patches.  Your  heart rate will be watched while you are resting and while you are exercising.  You will walk on the treadmill. The treadmill will slowly get faster to raise your heart rate.  The test will take about 1-2 hours. What happens after the procedure?  Your heart rate and blood pressure will be watched after the test.  You may return to your normal diet, activities, and medicines or as told by your doctor. This information is not intended to replace advice given to you by your health care provider. Make sure you discuss any questions you have with your health care provider. Document Released: 12/03/2007 Document Revised: 02/13/2016 Document Reviewed: 02/21/2013 Elsevier Interactive Patient Education  [employer].

## 2016-12-31 DIAGNOSIS — Z8249 Family history of ischemic heart disease and other diseases of the circulatory system: Secondary | ICD-10-CM | POA: Insufficient documentation

## 2016-12-31 HISTORY — DX: Family history of ischemic heart disease and other diseases of the circulatory system: Z82.49

## 2017-01-01 ENCOUNTER — Encounter: Payer: Self-pay | Admitting: Family Medicine

## 2017-01-01 ENCOUNTER — Ambulatory Visit (INDEPENDENT_AMBULATORY_CARE_PROVIDER_SITE_OTHER): Payer: BLUE CROSS/BLUE SHIELD | Admitting: Family Medicine

## 2017-01-01 DIAGNOSIS — R51 Headache: Secondary | ICD-10-CM

## 2017-01-01 DIAGNOSIS — R519 Headache, unspecified: Secondary | ICD-10-CM

## 2017-01-01 HISTORY — DX: Headache, unspecified: R51.9

## 2017-01-01 MED ORDER — SUMATRIPTAN SUCCINATE 50 MG PO TABS: 50.0000 mg | ORAL_TABLET | ORAL | 0 refills | Status: DC | PRN
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
## 2017-01-01 NOTE — Patient Instructions (Signed)
Great to see me.  Please keep a headache journal.  Imitrex as needed for migraine.

## 2017-01-01 NOTE — Assessment & Plan Note (Signed)
>  25 minutes spent in face to face time with patient, >50% spent in counselling or coordination of care Long talk with patient about discontinuing BC powder frequent use- discussed risks of GIB, kidney failure, medication overuse and rebound headaches.  Advised to keep a headache journal. Imitrex eRx sent to pharmacy to use for migrainous headaches- pt states she can tell when she has a migraine versus her other type of headache. Follow up in 1-2 months. The patient indicates understanding of these issues and agrees with the plan.

## 2017-01-01 NOTE — Progress Notes (Signed)
Pre visit review using our clinic review tool, if applicable. No additional management support is needed unless otherwise documented below in the visit note. 

## 2017-01-06 ENCOUNTER — Other Ambulatory Visit: Payer: Self-pay | Admitting: Family Medicine

## 2017-01-06 ENCOUNTER — Encounter: Payer: Self-pay | Admitting: Family Medicine

## 2017-01-06 ENCOUNTER — Telehealth: Payer: Self-pay | Admitting: Internal Medicine

## 2017-01-06 MED ORDER — IVERMECTIN 0.5 % EX LOTN: TOPICAL_LOTION | CUTANEOUS | 0 refills | Status: DC

## 2017-01-06 NOTE — Telephone Encounter (Signed)
Reviewed GXT instructions w/pt who verbalized understanding and is agreeable w/plan.

## 2017-01-07 ENCOUNTER — Ambulatory Visit (INDEPENDENT_AMBULATORY_CARE_PROVIDER_SITE_OTHER): Payer: BLUE CROSS/BLUE SHIELD

## 2017-01-07 DIAGNOSIS — R0609 Other forms of dyspnea: Secondary | ICD-10-CM

## 2017-01-07 DIAGNOSIS — R0600 Dyspnea, unspecified: Secondary | ICD-10-CM

## 2017-01-10 LAB — EXERCISE TOLERANCE TEST
Estimated workload: 10.7 METS
Exercise duration (min): 9 min
Exercise duration (sec): 23 s
MPHR: 179 {beats}/min
Peak HR: 166 {beats}/min
Percent HR: 92 %
Rest HR: 95 {beats}/min

## 2017-01-12 ENCOUNTER — Other Ambulatory Visit: Payer: Self-pay | Admitting: *Deleted

## 2017-01-12 DIAGNOSIS — Z8249 Family history of ischemic heart disease and other diseases of the circulatory system: Secondary | ICD-10-CM

## 2017-01-19 ENCOUNTER — Ambulatory Visit (INDEPENDENT_AMBULATORY_CARE_PROVIDER_SITE_OTHER)
Admission: RE | Admit: 2017-01-19 | Discharge: 2017-01-19 | Disposition: A | Discharge status: Home / Self Care | Payer: Self-pay | Source: Ambulatory Visit | Attending: Internal Medicine | Admitting: Internal Medicine

## 2017-01-19 DIAGNOSIS — Z8249 Family history of ischemic heart disease and other diseases of the circulatory system: Secondary | ICD-10-CM

## 2017-01-26 ENCOUNTER — Telehealth: Payer: Self-pay | Admitting: *Deleted

## 2017-01-26 DIAGNOSIS — R911 Solitary pulmonary nodule: Secondary | ICD-10-CM

## 2017-01-26 NOTE — Telephone Encounter (Signed)
-----   Message from Nelva Bush, MD sent at 01/26/2017  7:35 AM EDT ----- Please let Catherine Gilbert know that her cardiac CT does not show any coronary calcium, indicating a low risk for further cardiac events. A 6 mm nodule was incidentally noted in the right lung. Given her history of smoking, we should repeat a CT chest without contrast in ~6 months to ensure that the nodule is not growing. No further cardiac testing is needed at this time.

## 2017-01-26 NOTE — Telephone Encounter (Signed)
Results called to pt. Pt verbalized understanding. Patient is aware of upcoming appt date and time with Dr End. Patient requested to go ahead and schedule CT chest. Message left on MyChart with appt for 07/27/2017 at 0900, arrival at 08:45 am. No special prep or dietary restrictions needed as test is being done without contrast.

## 2017-02-03 ENCOUNTER — Encounter: Payer: Self-pay | Admitting: Family Medicine

## 2017-02-08 ENCOUNTER — Other Ambulatory Visit: Payer: Self-pay | Admitting: Family Medicine

## 2017-02-16 ENCOUNTER — Ambulatory Visit (INDEPENDENT_AMBULATORY_CARE_PROVIDER_SITE_OTHER): Payer: BLUE CROSS/BLUE SHIELD | Admitting: Family Medicine

## 2017-02-16 ENCOUNTER — Encounter: Payer: Self-pay | Admitting: Family Medicine

## 2017-02-16 VITALS — BP 122/82 | HR 97 | Temp 97.8°F | Resp 16 | Ht 64.0 in | Wt 163.1 lb

## 2017-02-16 DIAGNOSIS — M7702 Medial epicondylitis, left elbow: Secondary | ICD-10-CM

## 2017-02-16 NOTE — Progress Notes (Signed)
SUBJECTIVE: Catherine Gilbert is a 41 y.o. female who sustained a left elbow injury 3 month(s) ago. Mechanism of injury: lifting weights. Immediate symptoms: delayed pain. Symptoms have been constant since that time. Prior history of related problems: no prior problems with this area in the past.  Current Outpatient Prescriptions on File Prior to Visit  Medication Sig Dispense Refill  . ALPRAZolam (XANAX) 0.5 MG tablet Take 1 tablet (0.5 mg total) by mouth at bedtime as needed. 30 tablet 0  . ferrous sulfate 325 (65 FE) MG tablet Take 1 tablet (325 mg total) by mouth 2 (two) times daily with a meal. 180 tablet 3  . Ivermectin 0.5 % LOTN Apply once as directed. 1 Tube 0  . lisinopril (PRINIVIL,ZESTRIL) 10 MG tablet TAKE 1 TABLET DAILY 90 tablet 2  . omeprazole (PRILOSEC) 20 MG capsule TAKE 1 CAPSULE DAILY 90 capsule 3  . sertraline (ZOLOFT) 100 MG tablet Take 1 tablet (100 mg total) by mouth daily. 30 tablet 3  . SUMAtriptan (IMITREX) 50 MG tablet Take 1 tablet (50 mg total) by mouth every 2 (two) hours as needed for migraine. May repeat in 2 hours if headache persists or recurs. 10 tablet 0  . valACYclovir (VALTREX) 500 MG tablet Take 2 tablets by mouth today, then take 2 tablets by mouth twice daily as needed 60 tablet 3   No current facility-administered medications on file prior to visit.     No Known Allergies  Past Medical History:  Diagnosis Date  . Abnormal Pap smear   . Anxiety   . Depression   . HSV (herpes simplex virus) infection   . Hypertension   . Migraine headache     Past Surgical History:  Procedure Laterality Date  . CESAREAN SECTION     x 3   . COLPOSCOPY  2010  . DILATION AND CURETTAGE OF UTERUS    . TUBAL LIGATION      Family History  Problem Relation Age of Onset  . Stroke Mother 27       massive stroke  . Heart disease Father 66       congestive heart failure  . Hypertension Father   . Heart disease Brother 73       Suspected MI and SCD  . Heart  disease Sister   . Stroke Sister   . Breast cancer Maternal Grandmother 70  . Breast cancer Cousin        x2 paternal    Social History   Social History  . Marital status: Married    Spouse name: N/A  . Number of children: N/A  . Years of education: N/A   Occupational History  . Not on file.   Social History Main Topics  . Smoking status: Former Smoker    Packs/day: 1.00    Years: 25.00    Types: Cigarettes    Quit date: 11/22/2015  . Smokeless tobacco: Never Used     Comment: down to 1-2 cigarettes per day  . Alcohol use No  . Drug use: No  . Sexual activity: Yes    Birth control/ protection: Surgical     Comment: tubalization   Other Topics Concern  . Not on file   Social History Narrative  . No narrative on file   The PMH, PSH, Social History, Family History, Medications, and allergies have been reviewed in [facility], and have been updated if relevant.  OBJECTIVE: BP 122/82   Pulse 97   Temp 97.8 F (36.6  C) (Oral)   Resp 16   Ht 5\' 4"  (1.626 m)   Wt 163 lb 1.9 oz (74 kg)   SpO2 97%   BMI 28.00 kg/m   Vital signs as noted above. Appearance: alert, well appearing, and in no distress, oriented to person, place, and time and normal appearing weight. Elbow exam: medial epicondylar tenderness. X-ray: not indicated.  ASSESSMENT: Golfer's elbow  PLAN: rest the injured area as much as practical, apply ice packs, elevate the injured limb, compressive bandage See orders for this visit as documented in the electronic medical record.

## 2017-02-18 ENCOUNTER — Ambulatory Visit (INDEPENDENT_AMBULATORY_CARE_PROVIDER_SITE_OTHER): Payer: BLUE CROSS/BLUE SHIELD | Admitting: Internal Medicine

## 2017-02-18 ENCOUNTER — Encounter: Payer: Self-pay | Admitting: Internal Medicine

## 2017-02-18 VITALS — BP 130/80 | HR 90 | Ht 64.0 in | Wt 165.2 lb

## 2017-02-18 DIAGNOSIS — Z8249 Family history of ischemic heart disease and other diseases of the circulatory system: Secondary | ICD-10-CM | POA: Diagnosis not present

## 2017-02-18 DIAGNOSIS — R911 Solitary pulmonary nodule: Secondary | ICD-10-CM | POA: Diagnosis not present

## 2017-02-18 DIAGNOSIS — R0609 Other forms of dyspnea: Secondary | ICD-10-CM

## 2017-02-18 DIAGNOSIS — R0600 Dyspnea, unspecified: Secondary | ICD-10-CM

## 2017-02-18 NOTE — Patient Instructions (Addendum)
Medication Instructions:  Your physician recommends that you continue on your current medications as directed. Please refer to the Current Medication list given to you today.   Labwork: none  Testing/Procedures: CT of the Chest in January.  Follow-Up: Your physician wants you to follow-up in: 12 MONTHS WITH DR END. You will receive a reminder letter in the mail two months in advance. If you don't receive a letter, please call our office to schedule the follow-up appointment.    If you need a refill on your cardiac medications before your next appointment, please call your pharmacy.

## 2017-02-18 NOTE — Progress Notes (Signed)
Follow-up Outpatient Visit Date: 02/18/2017  Primary Care Provider: Lucille Passy, MD [place] [address]  Chief Complaint: Dyspnea on exertion  HPI:  Catherine Gilbert is a 41 y.o. year-old female with history of hypertension, prior tobacco abuse, headaches, depression, and anxiety, who presents for follow-up of dyspnea on exertion and family history of premature atherosclerotic cardiovascular disease. I last saw her in early July, at which time Catherine Gilbert reported long-standing exertional dyspnea. She was concerned by her brothers recent death at age 51 from a presumed heart attack. We will perform an exercise tolerance test which was low risk. Subsequent coronary calcium score was zero (low risk).  Today, Catherine Gilbert reports that her exertional dyspnea is stable. She is able to exercise on an elliptical trainer for 10-15 minutes before she starts getting out of breath. This has been unchanged for years. She denies chest pain, lightheadedness, orthopnea, PND, and edema. She notes rare palpitations that she describes as a skipped beat. At times, she feels drained at the Deondra Wigger of the day if she has been working a lot.  --------------------------------------------------------------------------------------------------  Cardiovascular History & Procedures: Cardiovascular Problems:  Dyspnea on exertion  Family history of premature atherosclerotic cardiovascular disease  Risk Factors:  Hypertension, history of tobacco use, and family history of premature ASCVD  Cath/PCI:  None  CV Surgery:  None  EP Procedures and Devices:  None  Non-Invasive Evaluation(s):  Coronary calcium score CT (01/19/17): Coronary artery calcium score 0. Normal ascending aorta size. Incidentally noted 6 mm right lower lobe lung nodule.  Exercise tolerance test (01/07/17): Good exercise capacity with normal heart rate and blood pressure response. No significant ST segment or T-wave  abnormalities. No arrhythmias. Low risk study ([facility] treadmill score 9).  Recent CV Pertinent Labs: Lab Results  Component Value Date   CHOL 152 11/11/2016   HDL 38.50 (L) 11/11/2016   LDLCALC 85 11/11/2016   TRIG 144.0 11/11/2016   CHOLHDL 4 11/11/2016   K 3.8 11/11/2016   BUN 12 11/11/2016   CREATININE 0.88 11/11/2016   CREATININE 0.76 04/23/2011    Past medical and surgical history were reviewed and updated in EPIC.  Current Meds  Medication Sig  . ALPRAZolam (XANAX) 0.5 MG tablet Take 1 tablet (0.5 mg total) by mouth at bedtime as needed.  . ferrous sulfate 325 (65 FE) MG tablet Take 1 tablet (325 mg total) by mouth 2 (two) times daily with a meal.  . lisinopril (PRINIVIL,ZESTRIL) 10 MG tablet TAKE 1 TABLET DAILY  . omeprazole (PRILOSEC) 20 MG capsule TAKE 1 CAPSULE DAILY  . sertraline (ZOLOFT) 100 MG tablet Take 1 tablet (100 mg total) by mouth daily.  . SUMAtriptan (IMITREX) 50 MG tablet Take 1 tablet (50 mg total) by mouth every 2 (two) hours as needed for migraine. May repeat in 2 hours if headache persists or recurs.  . valACYclovir (VALTREX) 500 MG tablet Take 2 tablets by mouth today, then take 2 tablets by mouth twice daily as needed    Allergies: Patient has no known allergies.  Social History   Social History  . Marital status: Married    Spouse name: N/A  . Number of children: N/A  . Years of education: N/A   Occupational History  . Not on file.   Social History Main Topics  . Smoking status: Former Smoker    Packs/day: 1.00    Years: 25.00    Types: Cigarettes    Quit date: 11/22/2015  . Smokeless  tobacco: Never Used     Comment: down to 1-2 cigarettes per day  . Alcohol use No  . Drug use: No  . Sexual activity: Yes    Birth control/ protection: Surgical     Comment: tubalization   Other Topics Concern  . Not on file   Social History Narrative  . No narrative on file    Family History  Problem Relation Age of Onset  . Stroke Mother 7        massive stroke  . Heart disease Father 75       congestive heart failure  . Hypertension Father   . Heart disease Brother 27       Suspected MI and SCD  . Heart disease Sister   . Stroke Sister   . Breast cancer Maternal Grandmother 99  . Breast cancer Cousin        x2 paternal    Review of Systems: Review of Systems  Constitutional: Positive for malaise/fatigue (tired after strenuous activity).  HENT: Negative.   Eyes: Negative.   Respiratory: Positive for shortness of breath (with exertion).   Cardiovascular: Negative.   Gastrointestinal: Negative.   Genitourinary: Negative.   Musculoskeletal: Positive for joint pain (Recent tendon injury at left elbow; wearing brace).  Skin: Negative.   Neurological: Negative.   Endo/Heme/Allergies: Negative.   Psychiatric/Behavioral: Negative.    --------------------------------------------------------------------------------------------------  Physical Exam: BP 130/80 (BP Location: Right Arm, Patient Position: Sitting, Cuff Size: Normal)   Pulse 90   Ht 5\' 4"  (1.626 m)   Wt 165 lb 4 oz (75 kg)   BMI 28.37 kg/m   General:  Overweight woman, seated comfortably in the exam room. HEENT: No conjunctival pallor or scleral icterus. Moist mucous membranes.  OP clear. Neck: Supple without lymphadenopathy, thyromegaly, JVD, or HJR. Lungs: Normal work of breathing. Clear to auscultation bilaterally without wheezes or crackles. Heart: Regular rate and rhythm without murmurs, rubs, or gallops. Non-displaced PMI. Abd: Bowel sounds present. Soft, NT/ND without hepatosplenomegaly Ext: No lower extremity edema. Radial, PT, and DP pulses are 2+ bilaterally. Soft brace noted about the left elbow. Skin: Warm and dry without rash.   Lab Results  Component Value Date   WBC 10.6 (H) 11/11/2016   HGB 13.7 11/11/2016   HCT 40.1 11/11/2016   MCV 92.9 11/11/2016   PLT 249.0 11/11/2016    Lab Results  Component Value Date   NA 138 11/11/2016     K 3.8 11/11/2016   CL 103 11/11/2016   CO2 29 11/11/2016   BUN 12 11/11/2016   CREATININE 0.88 11/11/2016   GLUCOSE 92 11/11/2016   ALT 17 11/11/2016    Lab Results  Component Value Date   CHOL 152 11/11/2016   HDL 38.50 (L) 11/11/2016   LDLCALC 85 11/11/2016   TRIG 144.0 11/11/2016   CHOLHDL 4 11/11/2016    --------------------------------------------------------------------------------------------------  ASSESSMENT AND PLAN: Dyspnea on exertion This has been a chronic problem for the patient and is likely multifactorial. I suspect underlying lung disease from long history of smoking is the driving factor. She demonstrated good exercise capacity on recent ETT, which was low risk without ischemic changes. She does not have any exam findings suggestive of heart failure or significant valvular heart disease. We discussed the utility of obtaining a transthoracic echocardiogram but have agreed to defer this. No further testing or intervention at this time.  Family history of atherosclerotic cardiovascular disease Recent ETT and coronary calcium score were low risk. We will continue  with age-appropriate primary prevention.  Right lower lobe lung nodule 6 mm RLL nodule incidentally noted on recent coronary calcium score CT. Given history of tobacco use, we have scheduled Catherine Gilbert for a follow-up unenhanced CT of the chest for late January, 2019.  Follow-up: Return to clinic in 1 year.  Nelva Bush, MD 02/18/2017 1:33 PM

## 2017-03-24 ENCOUNTER — Other Ambulatory Visit: Payer: Self-pay | Admitting: Family Medicine

## 2017-04-02 ENCOUNTER — Other Ambulatory Visit: Payer: Self-pay | Admitting: Family Medicine

## 2017-05-11 ENCOUNTER — Other Ambulatory Visit: Payer: Self-pay | Admitting: Family Medicine

## 2017-05-11 MED ORDER — SERTRALINE HCL 100 MG PO TABS: ORAL_TABLET | ORAL | 3 refills | Status: DC

## 2017-05-26 ENCOUNTER — Ambulatory Visit: Payer: Self-pay | Admitting: *Deleted

## 2017-05-26 DIAGNOSIS — J209 Acute bronchitis, unspecified: Secondary | ICD-10-CM | POA: Diagnosis not present

## 2017-05-26 DIAGNOSIS — J00 Acute nasopharyngitis [common cold]: Secondary | ICD-10-CM | POA: Diagnosis not present

## 2017-05-26 NOTE — Telephone Encounter (Signed)
Pt is having symptoms of strep throat.  Advised to go to Urgent Care for assessment. Care advice given with verbal understanding.  Reason for Disposition . SEVERE (e.g., excruciating) throat pain  Answer Assessment - Initial Assessment Questions 1. ONSET: "When did the throat start hurting?" (Hours or days ago)      saturday 2. SEVERITY: "How bad is the sore throat?" (Scale 1-10; mild, moderate or severe)   - MILD (1-3):  doesn't interfere with eating or normal activities   - MODERATE (4-7): interferes with eating some solids and normal activities   - SEVERE (8-10):  excruciating pain, interferes with most normal activities   - SEVERE DYSPHAGIA: can't swallow liquids, drooling     severe 3. STREP EXPOSURE: "Has there been any exposure to strep within the past week?" If so, ask: "What type of contact occurred?"      no 4.  VIRAL SYMPTOMS: "Are there any symptoms of a cold, such as a runny nose, cough, hoarse voice or red eyes?"      Cough, hoarse 5. FEVER: "Do you have a fever?" If so, ask: "What is your temperature, how was it measured, and when did it start?"     no 6. PUS ON THE TONSILS: "Is there pus on the tonsils in the back of your throat?"     Not sure 7. OTHER SYMPTOMS: "Do you have any other symptoms?" (e.g., difficulty breathing, headache, rash)     Headache off and on, a little hard to breathe because of the congestion 8. PREGNANCY: "Is there any chance you are pregnant?" "When was your last menstrual period?"     No, LMP 04/30/17  Protocols used: SORE THROAT-A-AH

## 2017-05-26 NOTE — Telephone Encounter (Signed)
FYI - patient is going to urgent care.

## 2017-06-17 DIAGNOSIS — R070 Pain in throat: Secondary | ICD-10-CM | POA: Diagnosis not present

## 2017-06-17 DIAGNOSIS — J301 Allergic rhinitis due to pollen: Secondary | ICD-10-CM | POA: Diagnosis not present

## 2017-06-17 DIAGNOSIS — K219 Gastro-esophageal reflux disease without esophagitis: Secondary | ICD-10-CM | POA: Diagnosis not present

## 2017-06-27 ENCOUNTER — Other Ambulatory Visit: Payer: Self-pay | Admitting: Internal Medicine

## 2017-06-27 ENCOUNTER — Other Ambulatory Visit: Payer: Self-pay | Admitting: Family Medicine

## 2017-06-29 ENCOUNTER — Other Ambulatory Visit: Payer: Self-pay | Admitting: Internal Medicine

## 2017-06-29 MED ORDER — ALPRAZOLAM 0.5 MG PO TABS: 0.5000 mg | ORAL_TABLET | Freq: Every evening | ORAL | 0 refills | Status: DC | PRN

## 2017-07-03 ENCOUNTER — Encounter: Payer: Self-pay | Admitting: Family Medicine

## 2017-07-27 ENCOUNTER — Ambulatory Visit: Payer: BLUE CROSS/BLUE SHIELD

## 2017-07-27 DIAGNOSIS — K219 Gastro-esophageal reflux disease without esophagitis: Secondary | ICD-10-CM | POA: Diagnosis not present

## 2017-07-27 DIAGNOSIS — H6123 Impacted cerumen, bilateral: Secondary | ICD-10-CM | POA: Diagnosis not present

## 2017-07-27 DIAGNOSIS — J019 Acute sinusitis, unspecified: Secondary | ICD-10-CM | POA: Diagnosis not present

## 2017-08-05 ENCOUNTER — Ambulatory Visit
Admission: RE | Admit: 2017-08-05 | Discharge: 2017-08-05 | Disposition: A | Discharge status: Home / Self Care | Payer: BLUE CROSS/BLUE SHIELD | Source: Ambulatory Visit | Attending: Internal Medicine | Admitting: Internal Medicine

## 2017-08-05 DIAGNOSIS — R911 Solitary pulmonary nodule: Secondary | ICD-10-CM | POA: Diagnosis not present

## 2017-08-07 ENCOUNTER — Telehealth: Payer: Self-pay | Admitting: Internal Medicine

## 2017-08-07 ENCOUNTER — Telehealth: Payer: Self-pay

## 2017-08-07 DIAGNOSIS — R9389 Abnormal findings on diagnostic imaging of other specified body structures: Secondary | ICD-10-CM

## 2017-08-07 NOTE — Telephone Encounter (Signed)
Patient returning call for test results

## 2017-08-07 NOTE — Telephone Encounter (Signed)
Left mom to have pt call to schedule appt

## 2017-08-07 NOTE — Telephone Encounter (Signed)
-----   Message from Emily Filbert, RN sent at 08/07/2017  9:04 AM EST ----- Referral placed for pulmonary Per Dr. Saunders Revel- patient had a repeat chest CT to follow up on a nodule and this has grown slight in size- would like further evaluation by pulmonary.   Patient aware she will be contacted to arrange

## 2017-08-07 NOTE — Telephone Encounter (Signed)
Notes recorded by Nelva Bush, MD on 08/06/2017 at 1:25 PM EST Please let Ms. Edison know that her follow-up chest CT shows that the previously noted right lower lobe nodule has increased in size slightly compared to last year. While it is still too small to characterize, I recommend that we refer her to pulmonary for further assessment and follow-up.   I spoke with the patient and notified her of her chest CT results.  She is agreeable with further evaluation by pulmonary. I advised her I will put this referral in and scheduling will call her to arrange. She voices understanding.

## 2017-08-18 ENCOUNTER — Encounter: Payer: Self-pay | Admitting: Internal Medicine

## 2017-08-18 ENCOUNTER — Ambulatory Visit: Payer: BLUE CROSS/BLUE SHIELD | Admitting: Internal Medicine

## 2017-08-18 VITALS — BP 140/90 | HR 88 | Resp 16 | Ht 64.0 in | Wt 174.0 lb

## 2017-08-18 DIAGNOSIS — R911 Solitary pulmonary nodule: Secondary | ICD-10-CM | POA: Diagnosis not present

## 2017-08-18 DIAGNOSIS — R0602 Shortness of breath: Secondary | ICD-10-CM | POA: Diagnosis not present

## 2017-08-18 NOTE — Progress Notes (Signed)
Name: Catherine Gilbert MRN: [id number] DOB: 04/09/1976     CONSULTATION DATE: 2.19.19 REFERRING MD :  END  CHIEF COMPLAINT:  Abnormal CT chest  STUDIES:  CT chest 12/2016 RLL ground glass nodule approx 6 mm  CT chest 2.6.19 I have Independently reviewed images of  CT chest   on 08/18/2017 Interpretation:RLL nodule-ground glass approx 1cm Slowly grown since last scan Images reviewed with patient     HISTORY OF PRESENT ILLNESS:   42 yo white female seen today for abnormal CT chest Patient initially had a calcium CT scan to assess for coronary artery disease in July 2018 Patient has a significant family history for sudden cardiac arrest and sudden cardiac death Patient was noted to have a 6 mm right lower lobe groundglass nodule and a repeat CT chest in August 05, 2017 shows increase in size of this right lower lobe groundglass nodular opacity approximately 1cm x 0.5 cm  Patient has a previous smoking history of 1 pack a day for 25 years Patient quit in May 2017  Patient does not have any signs or symptoms of respiratory complaints at this time Patient does not have shortness of breath patient does not have any cough no fevers no chills no wheezing Patient has slight increased work of breathing with exertion  Family history consists of CHF COPD extensive smoking history There are no signs of infection at this time There is no signs of CHF at this time  Case discussed with Dr. Genevive Bi and reviewed CT scan findings it seems that it is  amenable to resection   PAST MEDICAL HISTORY :   has a past medical history of Abnormal Pap smear, Anxiety, Depression, HSV (herpes simplex virus) infection, Hypertension, Lung nodule, and Migraine headache.  has a past surgical history that includes Cesarean section; Tubal ligation; Dilation and curettage of uterus; and Colposcopy (2010). Prior to Admission medications   Medication Sig Start Date End Date Taking? Authorizing Provider  ALPRAZolam  Duanne Moron) 0.5 MG tablet Take 1 tablet (0.5 mg total) by mouth at bedtime as needed. 06/29/17  Yes Lucille Passy, MD  amoxicillin-clavulanate (AUGMENTIN) 875-125 MG tablet Take 1 tablet by mouth every 12 (twelve) hours. 05/26/17  Yes [provider]  FEROSUL 325 (65 Fe) MG tablet TAKE 1 TABLET(325 MG) BY MOUTH TWICE DAILY WITH A MEAL 03/24/17  Yes Lucille Passy, MD  lisinopril (PRINIVIL,ZESTRIL) 10 MG tablet TAKE 1 TABLET DAILY 11/25/16  Yes Lucille Passy, MD  omeprazole (PRILOSEC) 40 MG capsule Take 40 mg by mouth daily. 07/17/17  Yes [provider]  sertraline (ZOLOFT) 100 MG tablet TAKE 1 TABLET(100 MG) BY MOUTH DAILY 05/11/17  Yes Lucille Passy, MD  SUMAtriptan (IMITREX) 50 MG tablet Take 1 tablet (50 mg total) by mouth every 2 (two) hours as needed for migraine. May repeat in 2 hours if headache persists or recurs. 01/01/17  Yes Lucille Passy, MD  valACYclovir (VALTREX) 500 MG tablet TAKE 2 TABLETS BY MOUTH TODAY, THEN 2 TABLETS TWICE DAILY AS NEEDED. 06/29/17  Yes Lucille Passy, MD   No Known Allergies  FAMILY HISTORY:  family history includes Breast cancer in her cousin; Breast cancer (age of onset: 82) in her maternal grandmother; Heart disease in her sister; Heart disease (age of onset: 79) in her brother; Heart disease (age of onset: 103) in her father; Hypertension in her father; Stroke in her sister; Stroke (age of onset: 50) in her mother. SOCIAL HISTORY:  reports that  she quit smoking about 20 months ago. Her smoking use included cigarettes. She has a 25.00 pack-year smoking history. she has never used smokeless tobacco. She reports that she does not drink alcohol or use drugs.  REVIEW OF SYSTEMS:   Constitutional: Negative for fever, chills, weight loss, malaise/fatigue and diaphoresis.  HENT: Negative for hearing loss, ear pain, nosebleeds, congestion, sore throat, neck pain, tinnitus and ear discharge.   Eyes: Negative for blurred vision, double vision, photophobia,  pain, discharge and redness.  Respiratory: Negative for cough, hemoptysis, sputum production, shortness of breath, wheezing and stridor.   Cardiovascular: Negative for chest pain, palpitations, orthopnea, claudication, leg swelling and PND.  Gastrointestinal: Negative for heartburn, nausea, vomiting, abdominal pain, diarrhea, constipation, blood in stool and melena.  Genitourinary: Negative for dysuria, urgency, frequency, hematuria and flank pain.  Musculoskeletal: Negative for myalgias, back pain, joint pain and falls.  Skin: Negative for itching and rash.  Neurological: Negative for dizziness, tingling, tremors, sensory change, speech change, focal weakness, seizures, loss of consciousness, weakness and headaches.  Endo/Heme/Allergies: Negative for environmental allergies and polydipsia. Does not bruise/bleed easily.  ALL OTHER ROS ARE NEGATIVE   BP 140/90 (BP Location: Left Arm, Cuff Size: Normal)   Pulse 88   Resp 16   Ht 5\' 4"  (1.626 m)   Wt 174 lb (78.9 kg)   SpO2 99%   BMI 29.87 kg/m   Physical Examination:   GENERAL:NAD, no fevers, chills, no weakness no fatigue HEAD: Normocephalic, atraumatic.  EYES: Pupils equal, round, reactive to light. Extraocular muscles intact. No scleral icterus.  MOUTH: Moist mucosal membrane.   EAR, NOSE, THROAT: Clear without exudates. No external lesions.  NECK: Supple. No thyromegaly. No nodules. No JVD.  PULMONARY:CTA B/L no wheezes, no crackles, no rhonchi CARDIOVASCULAR: S1 and S2. Regular rate and rhythm. No murmurs, rubs, or gallops. No edema.  GASTROINTESTINAL: Soft, nontender, nondistended. No masses. Positive bowel sounds.  MUSCULOSKELETAL: No swelling, clubbing, or edema. Range of motion full in all extremities.  NEUROLOGIC: Cranial nerves II through XII are intact. No gross focal neurological deficits.  SKIN: No ulceration, lesions, rashes, or cyanosis. Skin warm and dry. Turgor intact.  PSYCHIATRIC: Mood, affect within normal  limits. The patient is awake, alert and oriented x 3. Insight, judgment intact.       ASSESSMENT / PLAN: 42 year old pleasant white female with abnormal CT chest findings with right lower lobe groundglass nodular opacification that has been slowly growing from 6 mm to approximately 1 cm over the last 6 months in the setting of previous smoking history  At this time I would recommend resection of the nodule and Dr. Genevive Bi has reviewed the CT scan images with me and he agrees with that plan.  I have explained this to the patient and she understands the plan of action I will also recommend obtaining pulmonary function testing to assess lung function     Patient  satisfied with Plan of action and management. All questions answered  Corrin Parker, M.D.  [facility]  Medical Director [place] Director [facility] Cardio-Pulmonary Department

## 2017-08-18 NOTE — Patient Instructions (Signed)
Patient to be assessed for lung resection

## 2017-08-21 ENCOUNTER — Ambulatory Visit: Payer: BLUE CROSS/BLUE SHIELD | Admitting: Cardiothoracic Surgery

## 2017-08-21 ENCOUNTER — Encounter: Payer: Self-pay | Admitting: Cardiothoracic Surgery

## 2017-08-21 VITALS — BP 146/92 | HR 102 | Temp 98.1°F | Ht 64.0 in | Wt 172.0 lb

## 2017-08-21 DIAGNOSIS — R911 Solitary pulmonary nodule: Secondary | ICD-10-CM

## 2017-08-21 NOTE — Progress Notes (Signed)
Patient ID: Catherine Gilbert, female   DOB: 10/20/1975, 42 y.o.   MRN: [id number]  Chief Complaint  Patient presents with  . New Patient (Initial Visit)    Lung nodule per Dr. Genevive Bi    Referred By Dr. Maryfrances Bunnell Reason for Referral right lower lobe mass  HPI Location, Quality, Duration, Severity, Timing, Context, Modifying Factors, Associated Signs and Symptoms.  Catherine Gilbert is a 42 y.o. female.  Her current medical problem began several months ago after she experienced the loss of her brother who is 29 years of age.  The patient states that her brother died of a myocardial infarction and she therefore sought attention with her primary care physician for coronary evaluation.  She had a stress study done and that was unremarkable.  She subsequently had a coronary artery CT scan which was normal.  On that CT scan performed in July 2018 she had a 6 mm nodule identified in the right lower lobe.  It was recommended that she have an additional follow-up and that was completed in early February of this year.  This showed that the nodule in the right lower lobe perhaps increased slightly in size to about 10 mm.  It remains groundglass without any obvious solid component to it.  The patient does have a history of smoking but quit about 2 years ago.  She does not complain of any cardiac symptoms or any pulmonary symptoms.  But there is no family history of lung cancer but there is extensive history of coronary disease.  She was recently seen by Dr. Maryfrances Bunnell who asked him for my consultation regarding surgical management of her right lower lobe lung nodule.   Past Medical History:  Diagnosis Date  . Abnormal Pap smear   . Anxiety   . Anxiety state 04/23/2011  . Depression   . Dysmenorrhea 08/27/2011  . Dyspnea on exertion 10/30/2015  . Essential hypertension 09/15/2012  . Family history of ASCVD (arteriosclerotic cardiovascular disease) 12/31/2016  . Family history of premature CAD 11/11/2016  . GAD  (generalized anxiety disorder) 10/30/2015  . GERD (gastroesophageal reflux disease) 05/29/2014  . Headache 01/01/2017  . HSV (herpes simplex virus) infection   . Hypertension   . Initiation of Depo Provera 09/24/2011  . Lung nodule    RLL on coronary calcium score CT  . Migraine headache     Past Surgical History:  Procedure Laterality Date  . CESAREAN SECTION     x 3   . COLPOSCOPY  2010  . DILATION AND CURETTAGE OF UTERUS    . TUBAL LIGATION      Family History  Problem Relation Age of Onset  . Stroke Mother 67       massive stroke  . Heart disease Father 10       congestive heart failure  . Hypertension Father   . Heart disease Brother 63       Suspected MI and SCD  . Heart disease Sister   . Stroke Sister   . Breast cancer Maternal Grandmother 67  . Breast cancer Cousin        x2 paternal    Social History Social History   Tobacco Use  . Smoking status: Former Smoker    Packs/day: 1.00    Years: 25.00    Pack years: 25.00    Types: Cigarettes    Last attempt to quit: 11/22/2015    Years since quitting: 1.7  . Smokeless tobacco: Never Used  Substance Use  Topics  . Alcohol use: No  . Drug use: No    No Known Allergies  Current Outpatient Medications  Medication Sig Dispense Refill  . ALPRAZolam (XANAX) 0.5 MG tablet Take 1 tablet (0.5 mg total) by mouth at bedtime as needed. 30 tablet 0  . FEROSUL 325 (65 Fe) MG tablet TAKE 1 TABLET(325 MG) BY MOUTH TWICE DAILY WITH A MEAL 180 tablet 0  . lisinopril (PRINIVIL,ZESTRIL) 10 MG tablet TAKE 1 TABLET DAILY 90 tablet 2  . omeprazole (PRILOSEC) 40 MG capsule Take 40 mg by mouth daily.    . sertraline (ZOLOFT) 100 MG tablet TAKE 1 TABLET(100 MG) BY MOUTH DAILY 90 tablet 3  . SUMAtriptan (IMITREX) 50 MG tablet Take 1 tablet (50 mg total) by mouth every 2 (two) hours as needed for migraine. May repeat in 2 hours if headache persists or recurs. 10 tablet 0  . valACYclovir (VALTREX) 500 MG tablet TAKE 2 TABLETS BY MOUTH  TODAY, THEN 2 TABLETS TWICE DAILY AS NEEDED. 60 tablet 0  . amoxicillin-clavulanate (AUGMENTIN XR) 1000-62.5 MG 12 hr tablet Take 1 tablet by mouth 2 (two) times daily.  0   No current facility-administered medications for this visit.       Review of Systems A complete review of systems was asked and was negative except for the following positive findings occasional shortness of breath, depression, excessive urination, joint pain.  Blood pressure (!) 146/92, pulse (!) 102, temperature 98.1 F (36.7 C), temperature source Oral, height 5\' 4"  (1.626 m), weight 172 lb (78 kg), SpO2 98 %.  Physical Exam CONSTITUTIONAL:  Pleasant, well-developed, well-nourished, and in no acute distress. EYES: Pupils equal and reactive to light, Sclera non-icteric EARS, NOSE, MOUTH AND THROAT:  The oropharynx was clear.  Dentition is good repair.  Oral mucosa pink and moist. LYMPH NODES:  Lymph nodes in the neck and axillae were normal RESPIRATORY:  Lungs were clear.  Normal respiratory effort without pathologic use of accessory muscles of respiration CARDIOVASCULAR: Heart was regular without murmurs.  There were no carotid bruits. GI: The abdomen was soft, nontender, and nondistended. There were no palpable masses. There was no hepatosplenomegaly. There were normal bowel sounds in all quadrants. GU:  Rectal deferred.   MUSCULOSKELETAL:  Normal muscle strength and tone.  No clubbing or cyanosis.   SKIN:  There were no pathologic skin lesions.  There were no nodules on palpation. NEUROLOGIC:  Sensation is normal.  Cranial nerves are grossly intact. PSYCH:  Oriented to person, place and time.  Mood and affect are normal.  Data Reviewed Multiple CT scans  I have personally reviewed the patient's imaging, laboratory findings and medical records.    Assessment    I have independently reviewed the patient's CT scans.  I reviewed those with the patient and her husband today.  We looked at the films from July  2018 and compared those to the films performed in February 2019.  There is perhaps been a slight increase in the size of the groundglass right lower lobe nodule.  This may represent an early adenocarcinoma.    Plan    I discussed with her in great detail the options.  The current recommendation by radiologist was to wait 1 year and repeat the scan.  I told her that we could continue to monitor this area but I would do so slightly more frequently at every 6 months.  I also told her that we could proceed with a CT-guided needle biopsy if our radiologist  would agree.  We could also perform surgery for resection.  Of course there is the possibility that this may represent an early stage lung cancer that could be managed with radiation therapy as well.  I reviewed with her the indications and risks as well as the advantages and disadvantages of all these different techniques.  After extensive discussion with her and her family they would like to obtain some pulmonary function studies which are scheduled for next week.  They would like to know whether or not the lesion would be amenable to percutaneous biopsy.  I will see her back after that.  A definitive treatment has not yet been decided upon.       Nestor Lewandowsky, MD 08/21/2017, 12:17 PM

## 2017-08-25 ENCOUNTER — Ambulatory Visit: Payer: BLUE CROSS/BLUE SHIELD | Attending: Internal Medicine

## 2017-08-25 DIAGNOSIS — R0602 Shortness of breath: Secondary | ICD-10-CM

## 2017-08-25 DIAGNOSIS — R911 Solitary pulmonary nodule: Secondary | ICD-10-CM | POA: Diagnosis not present

## 2017-08-26 ENCOUNTER — Other Ambulatory Visit: Payer: Self-pay | Admitting: Family Medicine

## 2017-08-27 MED ORDER — FERROUS SULFATE 325 (65 FE) MG PO TABS: ORAL_TABLET | ORAL | 0 refills | Status: DC

## 2017-08-28 NOTE — Addendum Note (Signed)
Addended by: Wayna Chalet on: 08/28/2017 11:42 AM   Modules accepted: Orders

## 2017-09-02 ENCOUNTER — Other Ambulatory Visit: Payer: Self-pay | Admitting: Family Medicine

## 2017-09-07 ENCOUNTER — Encounter: Payer: Self-pay | Admitting: Cardiothoracic Surgery

## 2017-09-23 DIAGNOSIS — R911 Solitary pulmonary nodule: Secondary | ICD-10-CM | POA: Diagnosis not present

## 2017-09-25 ENCOUNTER — Telehealth: Payer: Self-pay

## 2017-09-25 NOTE — Telephone Encounter (Signed)
Called patient to let her know that Dr. Genevive Bi had received her notes from [facility] and that Dr. Genevive Bi wanted her to be seen at our office on 10/02/2017 to go over her options again. However, patient declined and stated that she rather go with the radiologist suggestion. She was told to have a CT Scan Chest without contrast in 6 months. Then after her CT Scan she would be more than glad to come and see Dr. Genevive Bi. I told her that I would have to let Dr. Genevive Bi know and if he agreed with this plan, then I would contact her in 5.5 months with her appointments. If I didn't call her, then she was okay. Patient understood and had no further questions.  Called Dr. Genevive Bi and he stated that it was fine but to document that the patient declined an appointment with him.  I will contact the patient in 5 months with her CT Scan and appointment with Dr. Genevive Bi.

## 2017-10-05 ENCOUNTER — Encounter: Payer: Self-pay | Admitting: Family Medicine

## 2017-10-06 ENCOUNTER — Other Ambulatory Visit: Payer: Self-pay

## 2017-10-06 MED ORDER — SERTRALINE HCL 100 MG PO TABS: ORAL_TABLET | ORAL | 3 refills | Status: DC

## 2017-10-06 NOTE — Progress Notes (Signed)
Per TA ok to increase sertraline to 150mg  total daily/sent in new Rx/sent message to pt/thx dmf

## 2017-10-06 NOTE — Progress Notes (Signed)
Had to be resent to [facility] as local won't pay for it/thx dmf

## 2017-10-20 ENCOUNTER — Other Ambulatory Visit: Payer: Self-pay | Admitting: Physician Assistant

## 2017-10-20 DIAGNOSIS — J029 Acute pharyngitis, unspecified: Secondary | ICD-10-CM | POA: Diagnosis not present

## 2017-10-20 DIAGNOSIS — R221 Localized swelling, mass and lump, neck: Secondary | ICD-10-CM

## 2017-10-20 DIAGNOSIS — J312 Chronic pharyngitis: Secondary | ICD-10-CM | POA: Diagnosis not present

## 2017-10-21 ENCOUNTER — Telehealth: Payer: Self-pay

## 2017-10-21 NOTE — Telephone Encounter (Signed)
PA for Sertraline initiated and faxed to [facility]/thx dmf

## 2017-10-26 ENCOUNTER — Ambulatory Visit
Admission: RE | Admit: 2017-10-26 | Discharge: 2017-10-26 | Disposition: A | Discharge status: Home / Self Care | Payer: BLUE CROSS/BLUE SHIELD | Source: Ambulatory Visit | Attending: Physician Assistant | Admitting: Physician Assistant

## 2017-10-28 ENCOUNTER — Other Ambulatory Visit: Payer: Self-pay | Admitting: Family Medicine

## 2017-10-29 MED ORDER — ALPRAZOLAM 0.5 MG PO TABS: 0.5000 mg | ORAL_TABLET | Freq: Every evening | ORAL | 0 refills | Status: DC | PRN

## 2017-11-02 ENCOUNTER — Ambulatory Visit
Admission: RE | Admit: 2017-11-02 | Discharge: 2017-11-02 | Disposition: A | Discharge status: Home / Self Care | Payer: BLUE CROSS/BLUE SHIELD | Source: Ambulatory Visit | Attending: Physician Assistant | Admitting: Physician Assistant

## 2017-11-12 ENCOUNTER — Other Ambulatory Visit: Payer: Self-pay | Admitting: Family Medicine

## 2017-11-17 ENCOUNTER — Other Ambulatory Visit (HOSPITAL_COMMUNITY)
Admission: RE | Admit: 2017-11-17 | Discharge: 2017-11-17 | Disposition: A | Discharge status: Home / Self Care | Payer: BLUE CROSS/BLUE SHIELD | Source: Ambulatory Visit | Attending: Family Medicine | Admitting: Family Medicine

## 2017-11-17 ENCOUNTER — Ambulatory Visit
Admission: RE | Admit: 2017-11-17 | Discharge: 2017-11-17 | Disposition: A | Discharge status: Home / Self Care | Payer: BLUE CROSS/BLUE SHIELD | Source: Ambulatory Visit | Attending: Physician Assistant | Admitting: Physician Assistant

## 2017-11-17 ENCOUNTER — Ambulatory Visit (INDEPENDENT_AMBULATORY_CARE_PROVIDER_SITE_OTHER): Payer: BLUE CROSS/BLUE SHIELD | Admitting: Family Medicine

## 2017-11-17 ENCOUNTER — Encounter: Payer: Self-pay | Admitting: Family Medicine

## 2017-11-17 VITALS — BP 116/80 | HR 90 | Temp 97.5°F | Ht 65.25 in | Wt 172.0 lb

## 2017-11-17 DIAGNOSIS — Z1231 Encounter for screening mammogram for malignant neoplasm of breast: Secondary | ICD-10-CM

## 2017-11-17 DIAGNOSIS — K219 Gastro-esophageal reflux disease without esophagitis: Secondary | ICD-10-CM

## 2017-11-17 DIAGNOSIS — R911 Solitary pulmonary nodule: Secondary | ICD-10-CM

## 2017-11-17 DIAGNOSIS — Z8249 Family history of ischemic heart disease and other diseases of the circulatory system: Secondary | ICD-10-CM

## 2017-11-17 DIAGNOSIS — R221 Localized swelling, mass and lump, neck: Secondary | ICD-10-CM | POA: Insufficient documentation

## 2017-11-17 DIAGNOSIS — Z01419 Encounter for gynecological examination (general) (routine) without abnormal findings: Secondary | ICD-10-CM | POA: Diagnosis not present

## 2017-11-17 DIAGNOSIS — Z1239 Encounter for other screening for malignant neoplasm of breast: Secondary | ICD-10-CM

## 2017-11-17 DIAGNOSIS — I10 Essential (primary) hypertension: Secondary | ICD-10-CM | POA: Diagnosis not present

## 2017-11-17 DIAGNOSIS — F411 Generalized anxiety disorder: Secondary | ICD-10-CM

## 2017-11-17 DIAGNOSIS — M50322 Other cervical disc degeneration at C5-C6 level: Secondary | ICD-10-CM | POA: Diagnosis not present

## 2017-11-17 DIAGNOSIS — Z23 Encounter for immunization: Secondary | ICD-10-CM

## 2017-11-17 LAB — CBC WITH DIFFERENTIAL/PLATELET
Basophils Absolute: 0 10*3/uL (ref 0.0–0.1)
Basophils Relative: 0.6 % (ref 0.0–3.0)
Eosinophils Absolute: 0.1 10*3/uL (ref 0.0–0.7)
Eosinophils Relative: 1 % (ref 0.0–5.0)
HCT: 39 % (ref 36.0–46.0)
Hemoglobin: 13.3 g/dL (ref 12.0–15.0)
Lymphocytes Relative: 27 % (ref 12.0–46.0)
Lymphs Abs: 2.1 10*3/uL (ref 0.7–4.0)
MCHC: 34.1 g/dL (ref 30.0–36.0)
MCV: 91.4 fl (ref 78.0–100.0)
Monocytes Absolute: 0.7 10*3/uL (ref 0.1–1.0)
Monocytes Relative: 9.6 % (ref 3.0–12.0)
Neutro Abs: 4.7 10*3/uL (ref 1.4–7.7)
Neutrophils Relative %: 61.8 % (ref 43.0–77.0)
Platelets: 227 10*3/uL (ref 150.0–400.0)
RBC: 4.26 Mil/uL (ref 3.87–5.11)
RDW: 12.4 % (ref 11.5–15.5)
WBC: 7.6 10*3/uL (ref 4.0–10.5)

## 2017-11-17 LAB — COMPREHENSIVE METABOLIC PANEL
ALT: 16 U/L (ref 0–35)
AST: 15 U/L (ref 0–37)
Albumin: 4.4 g/dL (ref 3.5–5.2)
Alkaline Phosphatase: 34 U/L — ABNORMAL LOW (ref 39–117)
BUN: 15 mg/dL (ref 6–23)
CO2: 29 mEq/L (ref 19–32)
Calcium: 9.6 mg/dL (ref 8.4–10.5)
Chloride: 103 mEq/L (ref 96–112)
Creatinine, Ser: 0.87 mg/dL (ref 0.40–1.20)
GFR: 75.69 mL/min (ref >60.00)
Glucose, Bld: 92 mg/dL (ref 70–99)
Potassium: 4.5 mEq/L (ref 3.5–5.1)
Sodium: 139 mEq/L (ref 135–145)
Total Bilirubin: 0.5 mg/dL (ref 0.2–1.2)
Total Protein: 6.7 g/dL (ref 6.0–8.3)

## 2017-11-17 LAB — LIPID PANEL
Cholesterol: 145 mg/dL (ref 0–200)
HDL: 33.2 mg/dL — ABNORMAL LOW (ref >39.00)
LDL Cholesterol: 84 mg/dL (ref 0–99)
NonHDL: 111.71
Total CHOL/HDL Ratio: 4
Triglycerides: 140 mg/dL (ref 0.0–149.0)
VLDL: 28 mg/dL (ref 0.0–40.0)

## 2017-11-17 LAB — TSH: TSH: 1.44 u[IU]/mL (ref 0.35–4.50)

## 2017-11-17 MED ORDER — IOPAMIDOL (ISOVUE-300) INJECTION 61%
75.0000 mL | Freq: Once | INTRAVENOUS | Status: AC | PRN
  Administered 2017-11-17: 75 mL via INTRAVENOUS

## 2017-11-17 NOTE — Assessment & Plan Note (Signed)
Reasonable control on current rxs.

## 2017-11-17 NOTE — Assessment & Plan Note (Addendum)
Being followed by Dr. Genevive Bi with serial CT scans.

## 2017-11-17 NOTE — Assessment & Plan Note (Signed)
Well controlled on current dose of lisinopril. No changes made today.

## 2017-11-17 NOTE — Patient Instructions (Addendum)
Great to see you. I will call you with your lab results from today and you can view them online.   Please call [facility] to schedule your mammogram.

## 2017-11-17 NOTE — Assessment & Plan Note (Signed)
CT of neck done this morning- results pending.

## 2017-11-17 NOTE — Progress Notes (Signed)
Subjective:   Patient ID: Catherine Gilbert, female    DOB: 09/03/1975, 42 y.o.   MRN: [id number]  Catherine Gilbert is a pleasant 42 y.o. year old female who presents to clinic today with Annual Exam (Patient is here today for a CPE with PAP.  She denies any vaginal D/C but states that for like 2 days after intercourse it smells bad down there.  She is not currently fasting.  She agrees to get Tdap today.)  on 11/17/2017  HPI:  Health Maintenance  Topic Date Due  . TETANUS/TDAP  05/01/1994  . PAP SMEAR  10/22/2017  . INFLUENZA VACCINE  01/28/2018  . HIV Screening  Completed     Last pap smear was on 10/23/14.  No h/o abnormal pap smears in the past 5 years.  Mammogram 09/15/16  Seeing ENT for left ear/neck discomfort (Dr. Richardson Landry)- Has been treated with 3 rounds of abx (last abx was levaquin).  Had CT of her neck done this morning for a neck mass that appeared last week. Results still pending.   Lung nodules- had follow up chest CT on 08/05/17- reviewed-  IMPRESSION: 1. Slight growth of pure ground-glass attenuation nodule in the periphery of the superior segment of the right lower lobe which currently measures 10 x 9 mm. This is suspicious, but nonspecific, and as there is no internal solid component at this time, PET-CT is not recommended due to the high likelihood of a false negative test (even if this is an early slow-growing neoplasm such as an adenocarcinoma). Standard recommendations are for repeat noncontrast chest CT every 2 years until 5 years of stability has been established. This recommendation follows the consensus statement: Guidelines for Management of Incidental Pulmonary Nodules Detected on CT Images: From the Fleischner Society 2017; Radiology 2017; 284:228-243. However, in this particular individual, given the growth over the past 6 months, the contact with the overlying pleura, and the patient's young age, a repeat noncontrast chest CT is instead  recommended in 12 months.    HTN- has been treated for HTN since she was in her early 40s.Denies any HA, blurred vision, CP or SOB. Has been well controlled on Lisinopril.  Lab Results  Component Value Date   CREATININE 0.88 11/11/2016     Anxiety-currently taking zoloft 150 mg daily and as needed xanax. Denies SI or HI.   GERD- feels improved with increased dose of omeprazole.  Current Outpatient Medications on File Prior to Visit  Medication Sig Dispense Refill  . ALPRAZolam (XANAX) 0.5 MG tablet Take 1 tablet (0.5 mg total) by mouth at bedtime as needed. 30 tablet 0  . ferrous sulfate (FEROSUL) 325 (65 FE) MG tablet TAKE 1 TABLET(325 MG) BY MOUTH TWICE DAILY WITH A MEAL 180 tablet 0  . lisinopril (PRINIVIL,ZESTRIL) 10 MG tablet TAKE 1 TABLET DAILY 90 tablet 0  . omeprazole (PRILOSEC) 40 MG capsule Take 40 mg by mouth daily.    . sertraline (ZOLOFT) 100 MG tablet Take 1.5 tabs qd 135 tablet 3  . SUMAtriptan (IMITREX) 50 MG tablet Take 1 tablet (50 mg total) by mouth every 2 (two) hours as needed for migraine. May repeat in 2 hours if headache persists or recurs. 10 tablet 0  . valACYclovir (VALTREX) 500 MG tablet TAKE 2 TABLETS BY MOUTH TODAY, THEN 2 TABLETS TWICE DAILY AS NEEDED. 60 tablet 0   No current facility-administered medications on file prior to visit.     No Known Allergies  Past Medical History:  Diagnosis Date  . Abnormal Pap smear   . Anxiety   . Anxiety state 04/23/2011  . Depression   . Dysmenorrhea 08/27/2011  . Dyspnea on exertion 10/30/2015  . Essential hypertension 09/15/2012  . Family history of ASCVD (arteriosclerotic cardiovascular disease) 12/31/2016  . Family history of premature CAD 11/11/2016  . GAD (generalized anxiety disorder) 10/30/2015  . GERD (gastroesophageal reflux disease) 05/29/2014  . Headache 01/01/2017  . HSV (herpes simplex virus) infection   . Hypertension   . Initiation of Depo Provera 09/24/2011  . Lung nodule    RLL on  coronary calcium score CT  . Migraine headache     Past Surgical History:  Procedure Laterality Date  . CESAREAN SECTION     x 3   . COLPOSCOPY  2010  . DILATION AND CURETTAGE OF UTERUS    . TUBAL LIGATION      Family History  Problem Relation Age of Onset  . Stroke Mother 62       massive stroke  . Heart disease Father 78       congestive heart failure  . Hypertension Father   . Heart disease Brother 7       Suspected MI and SCD  . Heart disease Sister   . Stroke Sister   . Breast cancer Maternal Grandmother 65  . Breast cancer Cousin        x2 paternal    Social History   Socioeconomic History  . Marital status: Married    Spouse name: Not on file  . Number of children: Not on file  . Years of education: Not on file  . Highest education level: Not on file  Occupational History  . Not on file  Social Needs  . Financial resource strain: Not on file  . Food insecurity:    Worry: Not on file    Inability: Not on file  . Transportation needs:    Medical: Not on file    Non-medical: Not on file  Tobacco Use  . Smoking status: Former Smoker    Packs/day: 1.00    Years: 25.00    Pack years: 25.00    Types: Cigarettes    Last attempt to quit: 11/22/2015    Years since quitting: 1.9  . Smokeless tobacco: Never Used  Substance and Sexual Activity  . Alcohol use: No  . Drug use: No  . Sexual activity: Yes    Birth control/protection: Surgical    Comment: tubalization  Lifestyle  . Physical activity:    Days per week: Not on file    Minutes per session: Not on file  . Stress: Not on file  Relationships  . Social connections:    Talks on phone: Not on file    Gets together: Not on file    Attends religious service: Not on file    Active member of club or organization: Not on file    Attends meetings of clubs or organizations: Not on file    Relationship status: Not on file  . Intimate partner violence:    Fear of current or ex partner: Not on file     Emotionally abused: Not on file    Physically abused: Not on file    Forced sexual activity: Not on file  Other Topics Concern  . Not on file  Social History Narrative  . Not on file   The PMH, PSH, Social History, Family History, Medications, and allergies have been reviewed in [facility], and have  been updated if relevant.  Review of Systems  HENT: Negative.   Eyes: Negative.   Respiratory: Negative.   Cardiovascular: Negative.   Gastrointestinal: Negative.   Skin: Negative.   Neurological: Negative.   Hematological: Negative.   All other systems reviewed and are negative.      Objective:    BP 116/80 (BP Location: Left Arm, Patient Position: Sitting, Cuff Size: Normal)   Pulse 90   Temp (!) 97.5 F (36.4 C) (Oral)   Ht 5' 5.25" (1.657 m)   Wt 172 lb (78 kg)   LMP 10/30/2017   SpO2 96%   BMI 28.40 kg/m    Physical Exam   General:  Well-developed,well-nourished,in no acute distress; alert,appropriate and cooperative throughout examination Head:  normocephalic and atraumatic.   Eyes:  vision grossly intact, PERRL Ears:  R ear normal and L ear normal externally, TMs clear bilaterally Nose:  no external deformity.   Mouth:  good dentition.   Neck:  No deformities, masses, or tenderness noted. Breasts:  No mass, nodules, thickening, tenderness, bulging, retraction, inflamation, nipple discharge or skin changes noted.   Lungs:  Normal respiratory effort, chest expands symmetrically. Lungs are clear to auscultation, no crackles or wheezes. Heart:  Normal rate and regular rhythm. S1 and S2 normal without gallop, murmur, click, rub or other extra sounds. Abdomen:  Bowel sounds positive,abdomen soft and non-tender without masses, organomegaly or hernias noted. Rectal:  no external abnormalities.   Genitalia:  Pelvic Exam:        External: normal female genitalia without lesions or masses        Vagina: normal without lesions or masses        Cervix: normal without lesions or  masses        Adnexa: normal bimanual exam without masses or fullness        Uterus: normal by palpation        Pap smear: performed Msk:  No deformity or scoliosis noted of thoracic or lumbar spine.   Extremities:  No clubbing, cyanosis, edema, or deformity noted with normal full range of motion of all joints.   Neurologic:  alert & oriented X3 and gait normal.   Skin:  Intact without suspicious lesions or rashes Cervical Nodes:  No lymphadenopathy noted Axillary Nodes:  No palpable lymphadenopathy Psych:  Cognition and judgment appear intact. Alert and cooperative with normal attention span and concentration. No apparent delusions, illusions, hallucinations       Assessment & Plan:   Well woman exam with routine gynecological exam  Need for Tdap vaccination - Plan: Tdap vaccine greater than or equal to 7yo IM  Lung nodule, solitary  GAD (generalized anxiety disorder)  Essential hypertension - Plan: CBC with Differential/Platelet, Comprehensive metabolic panel, Lipid panel, TSH  Family history of ASCVD (arteriosclerotic cardiovascular disease)  Gastroesophageal reflux disease, esophagitis presence not specified No follow-ups on file.

## 2017-11-17 NOTE — Assessment & Plan Note (Signed)
Improved with increased dose Omeprazole 40 mg daily.

## 2017-11-17 NOTE — Assessment & Plan Note (Signed)
Reviewed preventive care protocols, scheduled due services, and updated immunizations Discussed nutrition, exercise, diet, and healthy lifestyle.  Orders Placed This Encounter  Procedures  . Tdap vaccine greater than or equal to 43yo IM  . CBC with Differential/Platelet  . Comprehensive metabolic panel  . Lipid panel  . TSH

## 2017-11-19 LAB — CYTOLOGY - PAP
Diagnosis: NEGATIVE
HPV: NOT DETECTED

## 2017-12-01 ENCOUNTER — Other Ambulatory Visit: Payer: Self-pay | Admitting: Family Medicine

## 2017-12-22 ENCOUNTER — Encounter: Payer: Self-pay | Admitting: Family Medicine

## 2017-12-28 ENCOUNTER — Ambulatory Visit: Payer: BLUE CROSS/BLUE SHIELD | Admitting: Family Medicine

## 2017-12-28 ENCOUNTER — Ambulatory Visit (INDEPENDENT_AMBULATORY_CARE_PROVIDER_SITE_OTHER): Payer: BLUE CROSS/BLUE SHIELD

## 2017-12-28 ENCOUNTER — Encounter: Payer: Self-pay | Admitting: Family Medicine

## 2017-12-28 VITALS — BP 134/74 | HR 82 | Ht 63.0 in | Wt 168.0 lb

## 2017-12-28 DIAGNOSIS — M79644 Pain in right finger(s): Secondary | ICD-10-CM | POA: Diagnosis not present

## 2017-12-28 DIAGNOSIS — M65312 Trigger thumb, left thumb: Secondary | ICD-10-CM | POA: Insufficient documentation

## 2017-12-28 DIAGNOSIS — M65319 Trigger thumb, unspecified thumb: Secondary | ICD-10-CM | POA: Insufficient documentation

## 2017-12-28 MED ORDER — DICLOFENAC SODIUM 2 % TD SOLN: 1.0000 "application " | Freq: Two times a day (BID) | TRANSDERMAL | 3 refills | Status: DC

## 2017-12-28 NOTE — Patient Instructions (Signed)
Nice to meet you  Please try the splint during the day and night  Please follow up in 3 weeks if there is no improvement and we can inject it again.  I have sent in a medication that you can rub over the area.

## 2017-12-28 NOTE — Assessment & Plan Note (Signed)
Triggering evident on exam and confirmed on ultrasound. -Trigger thumb injection today. -Pennsaid provided -She will continue to wear her thumb splint -Can follow-up in 3 weeks if still having symptoms and can reinject at that time.

## 2017-12-28 NOTE — Progress Notes (Signed)
Catherine Gilbert - 42 y.o. female MRN [id number]  Date of birth: [birth year]  SUBJECTIVE:  Including CC & ROS.  Chief Complaint  Patient presents with  . Right thumb pain    Catherine Gilbert is a 42 y.o. female that is presenting with right thumb pain. Ongoing for two months. She experienced constant joint pain while she was taking Levaquin for an ear infection. She discontinued Levaquin on 12/22/17, she nocticed an improvement in her joint pain. Admits to difficulties grasping objects. Pain mild to severe when she flexes and extends her thumb.She has been wearing a brace on her thumb and taking CBD oil for the pain. She is an Glass blower/designer, requires typing daily. Denies any injury. Has triggering of the thumb. No prior history of similar problem.    Review of Systems  Constitutional: Negative for fever.  HENT: Negative for congestion.   Respiratory: Negative for cough.   Cardiovascular: Negative for chest pain.  Gastrointestinal: Negative for abdominal pain.  Musculoskeletal: Negative for back pain.  Skin: Negative for color change.  Neurological: Negative for weakness.  Hematological: Negative for adenopathy.  Psychiatric/Behavioral: Negative for agitation.    HISTORY: Past Medical, Surgical, Social, and Family History Reviewed & Updated per EMR.   Pertinent Historical Findings include:  Past Medical History:  Diagnosis Date  . Abnormal Pap smear   . Anxiety   . Anxiety state 04/23/2011  . Depression   . Dysmenorrhea 08/27/2011  . Dyspnea on exertion 10/30/2015  . Essential hypertension 09/15/2012  . Family history of ASCVD (arteriosclerotic cardiovascular disease) 12/31/2016  . Family history of premature CAD 11/11/2016  . GAD (generalized anxiety disorder) 10/30/2015  . GERD (gastroesophageal reflux disease) 05/29/2014  . Headache 01/01/2017  . HSV (herpes simplex virus) infection   . Hypertension   . Initiation of Depo Provera 09/24/2011  . Lung nodule    RLL on coronary calcium  score CT  . Migraine headache     Past Surgical History:  Procedure Laterality Date  . CESAREAN SECTION     x 3   . COLPOSCOPY  2010  . DILATION AND CURETTAGE OF UTERUS    . TUBAL LIGATION      No Known Allergies  Family History  Problem Relation Age of Onset  . Stroke Mother 28       massive stroke  . Heart disease Father 69       congestive heart failure  . Hypertension Father   . Heart disease Brother 21       Suspected MI and SCD  . Heart disease Sister   . Stroke Sister   . Breast cancer Maternal Grandmother 40  . Breast cancer Cousin        x2 paternal     Social History   Socioeconomic History  . Marital status: Married    Spouse name: Not on file  . Number of children: Not on file  . Years of education: Not on file  . Highest education level: Not on file  Occupational History  . Not on file  Social Needs  . Financial resource strain: Not on file  . Food insecurity:    Worry: Not on file    Inability: Not on file  . Transportation needs:    Medical: Not on file    Non-medical: Not on file  Tobacco Use  . Smoking status: Former Smoker    Packs/day: 1.00    Years: 25.00    Pack years: 25.00  Types: Cigarettes    Last attempt to quit: 11/22/2015    Years since quitting: 2.1  . Smokeless tobacco: Never Used  Substance and Sexual Activity  . Alcohol use: No  . Drug use: No  . Sexual activity: Yes    Birth control/protection: Surgical    Comment: tubalization  Lifestyle  . Physical activity:    Days per week: Not on file    Minutes per session: Not on file  . Stress: Not on file  Relationships  . Social connections:    Talks on phone: Not on file    Gets together: Not on file    Attends religious service: Not on file    Active member of club or organization: Not on file    Attends meetings of clubs or organizations: Not on file    Relationship status: Not on file  . Intimate partner violence:    Fear of current or ex partner: Not on  file    Emotionally abused: Not on file    Physically abused: Not on file    Forced sexual activity: Not on file  Other Topics Concern  . Not on file  Social History Narrative  . Not on file     PHYSICAL EXAM:  VS: BP 134/74 (BP Location: Left Arm, Patient Position: Sitting, Cuff Size: Normal)   Pulse 82   Ht 5\' 3"  (1.6 m)   Wt 168 lb (76.2 kg)   SpO2 98%   BMI 29.76 kg/m  Physical Exam Gen: NAD, alert, cooperative with exam, well-appearing ENT: normal lips, normal nasal mucosa,  Eye: normal EOM, normal conjunctiva and lids CV:  no edema, +2 pedal pulses   Resp: no accessory muscle use, non-labored,  GI: no masses or tenderness, no hernia  Skin: no rashes, no areas of induration  Neuro: normal tone, normal sensation to touch Psych:  normal insight, alert and oriented MSK:  Right thumb:  TTP of the MP  Triggering of the thumb evident.  Normal ROM  No signs of atrophy  Neurovascularly intact.    Aspiration/Injection Procedure Note RAINN ZUPKO 14-Jan-1976  Procedure: Injection Indications: right thumb pain   Procedure Details Consent: Risks of procedure as well as the alternatives and risks of each were explained to the (patient/caregiver).  Consent for procedure obtained. Time Out: Verified patient identification, verified procedure, site/side was marked, verified correct patient position, special equipment/implants available, medications/allergies/relevent history reviewed, required imaging and test results available.  Performed.  The area was cleaned with iodine and alcohol swabs.    The right thumb flexor tendon sheath/trigger thumb was injected using 1 cc's of 40 mg/mL kenalog and 1 cc's of 0.25% benzocaine with a 25 1 1/2" needle.  Ultrasound was used. Images were obtained in  Long views showing the injection.    A sterile dressing was applied.  Patient did tolerate procedure well.    ASSESSMENT & PLAN:   Thumb pain, right Triggering evident on exam and  confirmed on ultrasound. -Trigger thumb injection today. -Pennsaid provided -She will continue to wear her thumb splint -Can follow-up in 3 weeks if still having symptoms and can reinject at that time.

## 2018-01-18 ENCOUNTER — Telehealth: Payer: Self-pay

## 2018-01-18 NOTE — Telephone Encounter (Signed)
Message left for patient to call back to see about scheduling ct chest w/o contrast and f/u appointment with Dr Genevive Bi in August. Patient is in recalls.

## 2018-01-19 ENCOUNTER — Other Ambulatory Visit: Payer: Self-pay

## 2018-01-19 DIAGNOSIS — R911 Solitary pulmonary nodule: Secondary | ICD-10-CM

## 2018-01-22 ENCOUNTER — Ambulatory Visit
Admission: RE | Admit: 2018-01-22 | Discharge: 2018-01-22 | Disposition: A | Discharge status: Home / Self Care | Payer: BLUE CROSS/BLUE SHIELD | Source: Ambulatory Visit | Attending: Family Medicine | Admitting: Family Medicine

## 2018-01-22 DIAGNOSIS — Z1231 Encounter for screening mammogram for malignant neoplasm of breast: Secondary | ICD-10-CM | POA: Insufficient documentation

## 2018-01-22 DIAGNOSIS — Z1239 Encounter for other screening for malignant neoplasm of breast: Secondary | ICD-10-CM

## 2018-01-25 ENCOUNTER — Other Ambulatory Visit: Payer: Self-pay | Admitting: Family Medicine

## 2018-01-25 DIAGNOSIS — R928 Other abnormal and inconclusive findings on diagnostic imaging of breast: Secondary | ICD-10-CM

## 2018-01-25 DIAGNOSIS — N6310 Unspecified lump in the right breast, unspecified quadrant: Secondary | ICD-10-CM

## 2018-01-29 ENCOUNTER — Ambulatory Visit
Admission: RE | Admit: 2018-01-29 | Discharge: 2018-01-29 | Disposition: A | Discharge status: Home / Self Care | Payer: BLUE CROSS/BLUE SHIELD | Source: Ambulatory Visit | Attending: Family Medicine | Admitting: Family Medicine

## 2018-01-29 DIAGNOSIS — R922 Inconclusive mammogram: Secondary | ICD-10-CM | POA: Diagnosis not present

## 2018-01-29 DIAGNOSIS — N6310 Unspecified lump in the right breast, unspecified quadrant: Secondary | ICD-10-CM | POA: Insufficient documentation

## 2018-01-29 DIAGNOSIS — R928 Other abnormal and inconclusive findings on diagnostic imaging of breast: Secondary | ICD-10-CM | POA: Diagnosis not present

## 2018-01-29 DIAGNOSIS — N6311 Unspecified lump in the right breast, upper outer quadrant: Secondary | ICD-10-CM | POA: Diagnosis not present

## 2018-02-01 ENCOUNTER — Other Ambulatory Visit: Payer: Self-pay | Admitting: Family Medicine

## 2018-02-01 DIAGNOSIS — N6310 Unspecified lump in the right breast, unspecified quadrant: Secondary | ICD-10-CM

## 2018-02-01 DIAGNOSIS — R928 Other abnormal and inconclusive findings on diagnostic imaging of breast: Secondary | ICD-10-CM

## 2018-02-05 ENCOUNTER — Other Ambulatory Visit: Payer: Self-pay | Admitting: Family Medicine

## 2018-02-08 ENCOUNTER — Ambulatory Visit: Payer: BLUE CROSS/BLUE SHIELD

## 2018-02-10 ENCOUNTER — Ambulatory Visit
Admission: RE | Admit: 2018-02-10 | Discharge: 2018-02-10 | Disposition: A | Discharge status: Home / Self Care | Payer: BLUE CROSS/BLUE SHIELD | Source: Ambulatory Visit | Attending: Family Medicine | Admitting: Family Medicine

## 2018-02-10 DIAGNOSIS — N6001 Solitary cyst of right breast: Secondary | ICD-10-CM | POA: Diagnosis not present

## 2018-02-10 DIAGNOSIS — N6310 Unspecified lump in the right breast, unspecified quadrant: Secondary | ICD-10-CM | POA: Diagnosis not present

## 2018-02-10 DIAGNOSIS — R928 Other abnormal and inconclusive findings on diagnostic imaging of breast: Secondary | ICD-10-CM | POA: Diagnosis not present

## 2018-02-10 HISTORY — PX: BREAST CYST ASPIRATION: SHX578

## 2018-02-15 ENCOUNTER — Ambulatory Visit: Payer: BLUE CROSS/BLUE SHIELD

## 2018-02-15 ENCOUNTER — Ambulatory Visit
Admission: RE | Admit: 2018-02-15 | Discharge: 2018-02-15 | Disposition: A | Discharge status: Home / Self Care | Payer: BLUE CROSS/BLUE SHIELD | Source: Ambulatory Visit | Attending: Cardiothoracic Surgery | Admitting: Cardiothoracic Surgery

## 2018-02-15 DIAGNOSIS — R911 Solitary pulmonary nodule: Secondary | ICD-10-CM | POA: Diagnosis not present

## 2018-02-15 DIAGNOSIS — I700 Atherosclerosis of aorta: Secondary | ICD-10-CM | POA: Diagnosis not present

## 2018-02-18 ENCOUNTER — Other Ambulatory Visit: Payer: Self-pay

## 2018-02-18 MED ORDER — LISINOPRIL 10 MG PO TABS: 10.0000 mg | ORAL_TABLET | Freq: Every day | ORAL | 0 refills | Status: DC

## 2018-02-19 ENCOUNTER — Ambulatory Visit: Payer: BLUE CROSS/BLUE SHIELD | Admitting: Cardiothoracic Surgery

## 2018-02-19 ENCOUNTER — Encounter: Payer: Self-pay | Admitting: Cardiothoracic Surgery

## 2018-02-19 VITALS — BP 133/85 | HR 83 | Temp 97.7°F | Ht 63.0 in | Wt 165.0 lb

## 2018-02-19 DIAGNOSIS — R911 Solitary pulmonary nodule: Secondary | ICD-10-CM

## 2018-02-19 NOTE — Patient Instructions (Signed)
Please think about it over the weekend and give [place] a call.   Herb Grays works with Dr. Genevive Bi.

## 2018-02-19 NOTE — Progress Notes (Signed)
Patient ID: Catherine Gilbert, female   DOB: [birth year], 42 y.o.   MRN: [id number]  No chief complaint on file.    HPI Location, Quality, Duration, Severity, Timing, Context, Modifying Factors, Associated Signs and Symptoms.  Catherine Gilbert is a 42 y.o. female.  She was initially found to have a right lower lobe nodule about 1 year ago.  We have been following this off and on with serial surveillance CT scans and the most recent one showed an increase in size to about 12 mm with a 4 mm solid component.  She comes in today to discuss those results.  She has not smoked for over 2 years now but she does have a past history of about a 20-year smoking history.  She does not get short of breath.  She did have some pulmonary function studies performed back in February which revealed a DLCO of approximately 70%.  She works daily and does occasional heavy lifting and has no complaints related to dyspnea cough or fevers.   Past Medical History:  Diagnosis Date  . Abnormal Pap smear   . Anxiety   . Anxiety state 04/23/2011  . Depression   . Dysmenorrhea 08/27/2011  . Dyspnea on exertion 10/30/2015  . Essential hypertension 09/15/2012  . Family history of ASCVD (arteriosclerotic cardiovascular disease) 12/31/2016  . Family history of premature CAD 11/11/2016  . GAD (generalized anxiety disorder) 10/30/2015  . GERD (gastroesophageal reflux disease) 05/29/2014  . Headache 01/01/2017  . HSV (herpes simplex virus) infection   . Hypertension   . Initiation of Depo Provera 09/24/2011  . Lung nodule    RLL on coronary calcium score CT  . Migraine headache     Past Surgical History:  Procedure Laterality Date  . CESAREAN SECTION     x 3   . COLPOSCOPY  2010  . DILATION AND CURETTAGE OF UTERUS    . TUBAL LIGATION      Family History  Problem Relation Age of Onset  . Stroke Mother 20       massive stroke  . Heart disease Father 66       congestive heart failure  . Hypertension Father   . Heart disease  Brother 55       Suspected MI and SCD  . Heart disease Sister   . Stroke Sister   . Breast cancer Maternal Grandmother 68  . Breast cancer Cousin        x2 paternal    Social History Social History   Tobacco Use  . Smoking status: Former Smoker    Packs/day: 1.00    Years: 25.00    Pack years: 25.00    Types: Cigarettes    Last attempt to quit: 11/22/2015    Years since quitting: 2.2  . Smokeless tobacco: Never Used  Substance Use Topics  . Alcohol use: No  . Drug use: No    No Known Allergies  Current Outpatient Medications  Medication Sig Dispense Refill  . ALPRAZolam (XANAX) 0.5 MG tablet TAKE 1 TABLET BY MOUTH AT BEDTIME AS NEEDED 30 tablet 2  . Diclofenac Sodium (PENNSAID) 2 % SOLN Place 1 application onto the skin 2 (two) times daily. 1 Bottle 3  . ferrous sulfate (FEROSUL) 325 (65 FE) MG tablet TAKE 1 TABLET(325 MG) BY MOUTH TWICE DAILY WITH A MEAL 180 tablet 0  . lisinopril (PRINIVIL,ZESTRIL) 10 MG tablet Take 1 tablet (10 mg total) by mouth daily. 90 tablet 0  . omeprazole (PRILOSEC) 40  MG capsule Take 40 mg by mouth daily.    . penicillin v potassium (VEETID) 500 MG tablet TK 1 T PO QID TAT  1  . sertraline (ZOLOFT) 100 MG tablet Take 1.5 tabs qd 135 tablet 3  . valACYclovir (VALTREX) 500 MG tablet TAKE 2 TABLETS BY MOUTH TODAY, THEN 2 TABLETS TWICE DAILY AS NEEDED. 60 tablet 0   No current facility-administered medications for this visit.       Review of Systems A complete review of systems was asked and was negative except for the following positive findings all review of systems were negative.  Blood pressure 133/85, pulse 83, temperature 97.7 F (36.5 C), temperature source Oral, height 5\' 3"  (1.6 m), weight 165 lb (74.8 kg), SpO2 95 %.  Physical Exam CONSTITUTIONAL:  Pleasant, well-developed, well-nourished, and in no acute distress. EYES: Pupils equal and reactive to light, Sclera non-icteric EARS, NOSE, MOUTH AND THROAT:  The oropharynx was clear.   Dentition is good repair.  Oral mucosa pink and moist. LYMPH NODES:  Lymph nodes in the neck and axillae were normal RESPIRATORY:  Lungs were clear.  Normal respiratory effort without pathologic use of accessory muscles of respiration CARDIOVASCULAR: Heart was regular without murmurs.  There were no carotid bruits. GI: The abdomen was soft, nontender, and nondistended. There were no palpable masses. There was no hepatosplenomegaly. There were normal bowel sounds in all quadrants. GU:  Rectal deferred.   MUSCULOSKELETAL:  Normal muscle strength and tone.  No clubbing or cyanosis.   SKIN:  There were no pathologic skin lesions.  There were no nodules on palpation. NEUROLOGIC:  Sensation is normal.  Cranial nerves are grossly intact. PSYCH:  Oriented to person, place and time.  Mood and affect are normal.  Data Reviewed CT scan  I have personally reviewed the patient's imaging, laboratory findings and medical records.    Assessment    I have independently reviewed the patient's CT scan.  The right lower lobe groundglass nodule has increased in size.  I had a long discussion with her regarding the indications and risks of the various treatment options and diagnostic options.  We have attempted a CT-guided needle biopsy and that was unsuccessful because the patient mobility.  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  We discussed the role of radiation therapy, SBRT, wedge resection or lobectomy.  We discussed the role of thoracoscopy and thoracotomy.  I reviewed the risks including risks of bleeding, infection, air leak and death.  We discussed the postoperative management.  We discussed the advantages of radiation therapy.    Plan    After an extensive discussion with the patient they would like to think about their options over the weekend.  They will contact me next week to review any additional questions.  I believe that they are going to  pursue surgical therapy.       Nestor Lewandowsky, MD 02/19/2018, 9:37 AM

## 2018-02-24 ENCOUNTER — Other Ambulatory Visit: Payer: Self-pay | Admitting: Internal Medicine

## 2018-02-24 ENCOUNTER — Telehealth: Payer: Self-pay

## 2018-02-24 NOTE — Telephone Encounter (Signed)
The patient is scheduled for surgery at [facility] with Dr Genevive Bi for 03/29/18. She will pre admit by phone. Surgery instructions reviewed and mailed to patient. She is aware of date and instructions.

## 2018-02-24 NOTE — Telephone Encounter (Signed)
Message left for patient to call back to speak about scheduling surgery with Dr Genevive Bi.

## 2018-02-26 ENCOUNTER — Other Ambulatory Visit: Payer: Self-pay | Admitting: Cardiothoracic Surgery

## 2018-02-26 ENCOUNTER — Other Ambulatory Visit: Payer: Self-pay

## 2018-02-26 ENCOUNTER — Other Ambulatory Visit: Payer: BLUE CROSS/BLUE SHIELD

## 2018-02-26 DIAGNOSIS — R911 Solitary pulmonary nodule: Secondary | ICD-10-CM

## 2018-02-26 MED ORDER — LISINOPRIL 10 MG PO TABS: 10.0000 mg | ORAL_TABLET | Freq: Every day | ORAL | 0 refills | Status: DC

## 2018-02-26 NOTE — Telephone Encounter (Signed)
Dr Dahlia Byes will be assisting Dr Genevive Bi with this surgery.

## 2018-02-28 DIAGNOSIS — C801 Malignant (primary) neoplasm, unspecified: Secondary | ICD-10-CM

## 2018-02-28 HISTORY — DX: Malignant (primary) neoplasm, unspecified: C80.1

## 2018-03-22 ENCOUNTER — Encounter
Admission: RE | Admit: 2018-03-22 | Discharge: 2018-03-22 | Disposition: A | Discharge status: Home / Self Care | Payer: BLUE CROSS/BLUE SHIELD | Source: Ambulatory Visit | Attending: Cardiothoracic Surgery | Admitting: Cardiothoracic Surgery

## 2018-03-22 ENCOUNTER — Other Ambulatory Visit: Payer: Self-pay

## 2018-03-22 HISTORY — DX: Anemia, unspecified: D64.9

## 2018-03-22 NOTE — Pre-Procedure Instructions (Signed)
Catherine Gilbert  EXERCISE TOLERANCE TEST (ETT)  Order# [id number]  Reading physician: Nelva Bush, MD Ordering physician: Nelva Bush, MD Study date: 01/07/17  Patient Information   Name MRN Description  Catherine Gilbert [id number] 42 y.o. female  Result Notes for Exercise Tolerance Test   Notes recorded by Vanessa Ralphs, RN on 01/12/2017 at 8:45 AM EDT Results called to pt. Pt verbalized understanding. Order for CT Calcium scoring entered. Patient verbalized understanding to call [phone] at [facility] street to schedule the CT to be performed prior to appt on 02/18/17. She is aware the cost of the test is $150.00. ------  Notes recorded by Nelva Bush, MD on 01/10/2017 at 10:16 PM EDT Please let Catherine Gilbert know that her exercise tolerance test is normal. Given her family history of CAD and other CV risk factors, I recommend that we proceed with coronary calcium scoring, as we discussed at her recent visit. We should f/u after completion of this test. Thanks.      Vitals   Height Weight BMI (Calculated)  5\' 4"  (1.626 m) 75 kg 28.35  Study Highlights    Good exercise capacity with normal heart rate and blood pressure response. No angina reported.  No significant ST-segment or T-wave abnormalities during stress or recovery; baseline EKG shows non-specific T-wave changes.  No significant arrhythmias.  Low risk exercise tolerance test (Duke Treadmill Score = 9)    Stress Findings   ECG Normal sinus rhythm with non-specific T-wave changes. .  Stress Findings The patient exercised following the Bruce protocol.  The patient reported shortness of breath during the stress test. The patient experienced no angina during the stress test.   The test was stopped because the patient complained of shortness of breath. Test was stopped per protocol.   Blood pressure and heart rate demonstrated a normal response to exercise. Blood pressure demonstrated a normal response to  exercise. Overall, the patient's exercise capacity was normal.   Recovery time: 7 minutes.  Duke Treadmill Score: low risk The patient's response to exercise was adequate for diagnosis.  Response to Stress There was no ST segment deviation noted during stress.  Arrhythmias during stress: none.  Arrhythmias during recovery: none.  There were no significant arrhythmias noted during the test.  ECG was interpretable and conclusive.  Stress Measurements   Baseline Vitals  Rest HR 95 bpm    Rest BP 108/75 mmHg    Exercise Time  Exercise duration (min) 9 min    Exercise duration (sec) 23 sec    Peak Stress Vitals  Peak HR 166 bpm    Peak BP 162/81 mmHg    Exercise Data  MPHR 179 bpm    Percent HR 92 %    Estimated workload 10.7 METS       Resulted by:   Signed Date/Time  Phone Pager  END, [place] 01/10/2017 9:26 PM [phone]   Report approved and finalized on 01/10/2017 2126  Imaging   Imaging Information  Encounter-Level Documents - 01/07/2017:   Electronic signature on 01/07/2017 10:15 AM - Signed      Order-Level Documents - 01/07/2017:   Scan on 01/07/2017 12:02 PM by Default, Provider, MD      Exam Information   Status Exam Begun  Exam Ended   Final [99] 01/07/2017 11:56 AM 01/07/2017 11:57 AM  External Result Report   External Result Report  Encounter Report   Patient Encounter Report

## 2018-03-22 NOTE — Patient Instructions (Addendum)
Your procedure is scheduled on: 03-29-18 MONDAY Report to Same Day Surgery 2nd floor medical mall [facility] Entrance-take elevator on left to 2nd floor.  Check in with surgery information desk.) To find out your arrival time please call [phone] between 1PM - 3PM on 03-26-18 FRIDAY  Remember: Instructions that are not followed completely may result in serious medical risk, up to and including death, or upon the discretion of your surgeon and anesthesiologist your surgery may need to be rescheduled.    _x___ 1. Do not eat food after midnight the night before your procedure. NO GUM OR CANDY AFTER MIDNIGHT. You may drink clear liquids up to 2 hours before you are scheduled to arrive at the hospital for your procedure.  Do not drink clear liquids within 2 hours of your scheduled arrival to the hospital.  Clear liquids include  --Water or Apple juice without pulp  --Clear carbohydrate beverage such as ClearFast or Gatorade  --Black Coffee or Clear Tea (No milk, no creamers, do not add anything to the coffee or Tea   ____Ensure clear carbohydrate drink on the way to the hospital for bariatric patients  ____Ensure clear carbohydrate drink 3 hours before surgery for Dr Dwyane Luo patients if physician instructed.     __x__ 2. No Alcohol for 24 hours before or after surgery.   __x__3. No Smoking or e-cigarettes for 24 prior to surgery.  Do not use any chewable tobacco products for at least 6 hour prior to surgery   ____  4. Bring all medications with you on the day of surgery if instructed.    __x__ 5. Notify your doctor if there is any change in your medical condition     (cold, fever, infections).    x___6. On the morning of surgery brush your teeth with toothpaste and water.  You may rinse your mouth with mouth wash if you wish.  Do not swallow any toothpaste or mouthwash.   Do not wear jewelry, make-up, hairpins, clips or nail polish.  Do not wear lotions, powders, or perfumes. You  may wear deodorant.  Do not shave 48 hours prior to surgery. Men may shave face and neck.  Do not bring valuables to the hospital.    [facility] is not responsible for any belongings or valuables.               Contacts, dentures or bridgework may not be worn into surgery.  Leave your suitcase in the car. After surgery it may be brought to your room.  For patients admitted to the hospital, discharge time is determined by your treatment team.  _  Patients discharged the day of surgery will not be allowed to drive home.  You will need someone to drive you home and stay with you the night of your procedure.    Please read over the following fact sheets that you were given:   [facility] Preparing for Surgery   _x___ TAKE THE FOLLOWING MEDICATION THE MORNING OF SURGERY WITH A SMALL SIP OF WATER. These include:  1. ZOLOFT (SERTRALINE)  2. PRILOSEC (OMEPRAZOLE)  3. TAKE AN EXTRA PRILOSEC THE NIGHT BEFORE YOUR SURGERY  4. YOU MAY TAKE XANAX (ALPRAZOLAM) DAY OF SURGERY IF NEEDED  5.  6.  ____Fleets enema or Magnesium Citrate as directed.   _x___ Use CHG Soap or sage wipes as directed on instruction sheet   ____ Use inhalers on the day of surgery and bring to hospital day of surgery  ____ Stop  Metformin and Janumet 2 days prior to surgery.    ____ Take 1/2 of usual insulin dose the night before surgery and none on the morning surgery.   _x___ Follow recommendations from Cardiologist, Pulmonologist or PCP regarding stopping Aspirin, Coumadin, Plavix ,Eliquis, Effient, or Pradaxa, and Pletal.  X____Stop Anti-inflammatories such as Advil, Aleve, Ibuprofen, Motrin, Naproxen, Naprosyn, Goodies powders, BC POWDERS or aspirin products NOW-OK to take Tylenol   _x___ Stop supplements until after surgery-STOP [place]   ____ Bring C-Pap to the hospital.

## 2018-03-23 ENCOUNTER — Ambulatory Visit
Admission: RE | Admit: 2018-03-23 | Discharge: 2018-03-23 | Disposition: A | Discharge status: Home / Self Care | Payer: BLUE CROSS/BLUE SHIELD | Source: Ambulatory Visit | Attending: Cardiothoracic Surgery | Admitting: Cardiothoracic Surgery

## 2018-03-23 ENCOUNTER — Encounter
Admission: RE | Admit: 2018-03-23 | Discharge: 2018-03-23 | Disposition: A | Discharge status: Home / Self Care | Payer: BLUE CROSS/BLUE SHIELD | Source: Ambulatory Visit | Attending: Cardiothoracic Surgery | Admitting: Cardiothoracic Surgery

## 2018-03-23 DIAGNOSIS — Z01812 Encounter for preprocedural laboratory examination: Secondary | ICD-10-CM

## 2018-03-23 DIAGNOSIS — Z01810 Encounter for preprocedural cardiovascular examination: Secondary | ICD-10-CM

## 2018-03-23 DIAGNOSIS — Z01818 Encounter for other preprocedural examination: Secondary | ICD-10-CM | POA: Diagnosis not present

## 2018-03-23 LAB — COMPREHENSIVE METABOLIC PANEL
ALT: 24 U/L (ref 0–44)
AST: 19 U/L (ref 15–41)
Albumin: 4.3 g/dL (ref 3.5–5.0)
Alkaline Phosphatase: 34 U/L — ABNORMAL LOW (ref 38–126)
Anion gap: 7 (ref 5–15)
BUN: 19 mg/dL (ref 6–20)
CO2: 26 mmol/L (ref 22–32)
Calcium: 9 mg/dL (ref 8.9–10.3)
Chloride: 104 mmol/L (ref 98–111)
Creatinine, Ser: 0.74 mg/dL (ref 0.44–1.00)
GFR calc Af Amer: >60 mL/min (ref >60)
GFR calc non Af Amer: >60 mL/min (ref >60)
Glucose, Bld: 99 mg/dL (ref 70–99)
Potassium: 3.8 mmol/L (ref 3.5–5.1)
Sodium: 137 mmol/L (ref 135–145)
Total Bilirubin: 0.6 mg/dL (ref 0.3–1.2)
Total Protein: 6.8 g/dL (ref 6.5–8.1)

## 2018-03-23 LAB — PROTIME-INR
INR: 0.92
Prothrombin Time: 12.3 seconds (ref 11.4–15.2)

## 2018-03-23 LAB — CBC WITH DIFFERENTIAL/PLATELET
Basophils Absolute: 0 10*3/uL (ref 0–0.1)
Basophils Relative: 1 %
Eosinophils Absolute: 0.1 10*3/uL (ref 0–0.7)
Eosinophils Relative: 1 %
HCT: 35.1 % (ref 35.0–47.0)
Hemoglobin: 12.1 g/dL (ref 12.0–16.0)
Lymphocytes Relative: 24 %
Lymphs Abs: 2.1 10*3/uL (ref 1.0–3.6)
MCH: 31.1 pg (ref 26.0–34.0)
MCHC: 34.4 g/dL (ref 32.0–36.0)
MCV: 90.5 fL (ref 80.0–100.0)
Monocytes Absolute: 0.7 10*3/uL (ref 0.2–0.9)
Monocytes Relative: 8 %
Neutro Abs: 5.9 10*3/uL (ref 1.4–6.5)
Neutrophils Relative %: 66 %
Platelets: 224 10*3/uL (ref 150–440)
RBC: 3.88 MIL/uL (ref 3.80–5.20)
RDW: 13 % (ref 11.5–14.5)
WBC: 8.8 10*3/uL (ref 3.6–11.0)

## 2018-03-23 LAB — APTT: aPTT: 36 seconds (ref 24–36)

## 2018-03-28 MED ORDER — CEFAZOLIN SODIUM-DEXTROSE 2-4 GM/100ML-% IV SOLN
2.0000 g | INTRAVENOUS | Status: AC
  Administered 2018-03-29: 2 g via INTRAVENOUS

## 2018-03-29 ENCOUNTER — Ambulatory Visit: Payer: BLUE CROSS/BLUE SHIELD | Admitting: Anesthesiology

## 2018-03-29 ENCOUNTER — Encounter: Payer: Self-pay | Admitting: *Deleted

## 2018-03-29 ENCOUNTER — Encounter
Admission: RE | Disposition: A | Discharge status: Home / Self Care | Payer: Self-pay | Source: Ambulatory Visit | Attending: Cardiothoracic Surgery

## 2018-03-29 ENCOUNTER — Ambulatory Visit: Payer: BLUE CROSS/BLUE SHIELD

## 2018-03-29 ENCOUNTER — Other Ambulatory Visit: Payer: Self-pay

## 2018-03-29 ENCOUNTER — Inpatient Hospital Stay
Admission: RE | Admit: 2018-03-29 | Discharge: 2018-04-02 | DRG: 164 | Disposition: A | Discharge status: Home / Self Care | Payer: BLUE CROSS/BLUE SHIELD | Source: Ambulatory Visit | Attending: Cardiothoracic Surgery | Admitting: Cardiothoracic Surgery

## 2018-03-29 DIAGNOSIS — Z4682 Encounter for fitting and adjustment of non-vascular catheter: Secondary | ICD-10-CM | POA: Diagnosis not present

## 2018-03-29 DIAGNOSIS — C3431 Malignant neoplasm of lower lobe, right bronchus or lung: Principal | ICD-10-CM | POA: Diagnosis present

## 2018-03-29 DIAGNOSIS — R911 Solitary pulmonary nodule: Secondary | ICD-10-CM | POA: Diagnosis not present

## 2018-03-29 DIAGNOSIS — R079 Chest pain, unspecified: Secondary | ICD-10-CM | POA: Diagnosis not present

## 2018-03-29 DIAGNOSIS — I10 Essential (primary) hypertension: Secondary | ICD-10-CM | POA: Diagnosis present

## 2018-03-29 DIAGNOSIS — Z87891 Personal history of nicotine dependence: Secondary | ICD-10-CM | POA: Diagnosis not present

## 2018-03-29 DIAGNOSIS — J95812 Postprocedural air leak: Secondary | ICD-10-CM | POA: Diagnosis not present

## 2018-03-29 DIAGNOSIS — K219 Gastro-esophageal reflux disease without esophagitis: Secondary | ICD-10-CM | POA: Diagnosis present

## 2018-03-29 DIAGNOSIS — Z09 Encounter for follow-up examination after completed treatment for conditions other than malignant neoplasm: Secondary | ICD-10-CM

## 2018-03-29 DIAGNOSIS — R918 Other nonspecific abnormal finding of lung field: Secondary | ICD-10-CM | POA: Diagnosis present

## 2018-03-29 DIAGNOSIS — F329 Major depressive disorder, single episode, unspecified: Secondary | ICD-10-CM | POA: Diagnosis not present

## 2018-03-29 DIAGNOSIS — Z79899 Other long term (current) drug therapy: Secondary | ICD-10-CM | POA: Diagnosis not present

## 2018-03-29 DIAGNOSIS — C3491 Malignant neoplasm of unspecified part of right bronchus or lung: Secondary | ICD-10-CM | POA: Diagnosis not present

## 2018-03-29 DIAGNOSIS — J9811 Atelectasis: Secondary | ICD-10-CM | POA: Diagnosis not present

## 2018-03-29 DIAGNOSIS — R0600 Dyspnea, unspecified: Secondary | ICD-10-CM

## 2018-03-29 DIAGNOSIS — Z9689 Presence of other specified functional implants: Secondary | ICD-10-CM

## 2018-03-29 DIAGNOSIS — J939 Pneumothorax, unspecified: Secondary | ICD-10-CM | POA: Diagnosis not present

## 2018-03-29 HISTORY — PX: THORACOTOMY: SHX5074

## 2018-03-29 HISTORY — PX: VIDEO ASSISTED THORACOSCOPY (VATS)/WEDGE RESECTION: SHX6174

## 2018-03-29 HISTORY — PX: FLEXIBLE BRONCHOSCOPY: SHX5094

## 2018-03-29 LAB — CBC WITH DIFFERENTIAL/PLATELET
Basophils Absolute: 0 10*3/uL (ref 0–0.1)
Basophils Relative: 0 %
Eosinophils Absolute: 0 10*3/uL (ref 0–0.7)
Eosinophils Relative: 0 %
HCT: 36.6 % (ref 35.0–47.0)
Hemoglobin: 12.9 g/dL (ref 12.0–16.0)
Lymphocytes Relative: 6 %
Lymphs Abs: 1.1 10*3/uL (ref 1.0–3.6)
MCH: 32.2 pg (ref 26.0–34.0)
MCHC: 35.3 g/dL (ref 32.0–36.0)
MCV: 91.2 fL (ref 80.0–100.0)
Monocytes Absolute: 0.3 10*3/uL (ref 0.2–0.9)
Monocytes Relative: 2 %
Neutro Abs: 16 10*3/uL — ABNORMAL HIGH (ref 1.4–6.5)
Neutrophils Relative %: 92 %
Platelets: 223 10*3/uL (ref 150–440)
RBC: 4.02 MIL/uL (ref 3.80–5.20)
RDW: 13 % (ref 11.5–14.5)
WBC: 17.4 10*3/uL — ABNORMAL HIGH (ref 3.6–11.0)

## 2018-03-29 LAB — BASIC METABOLIC PANEL
Anion gap: 6 (ref 5–15)
BUN: 15 mg/dL (ref 6–20)
CO2: 27 mmol/L (ref 22–32)
Calcium: 8.8 mg/dL — ABNORMAL LOW (ref 8.9–10.3)
Chloride: 103 mmol/L (ref 98–111)
Creatinine, Ser: 0.7 mg/dL (ref 0.44–1.00)
GFR calc Af Amer: >60 mL/min (ref >60)
GFR calc non Af Amer: >60 mL/min (ref >60)
Glucose, Bld: 124 mg/dL — ABNORMAL HIGH (ref 70–99)
Potassium: 3.9 mmol/L (ref 3.5–5.1)
Sodium: 136 mmol/L (ref 135–145)

## 2018-03-29 LAB — GLUCOSE, CAPILLARY: Glucose-Capillary: 103 mg/dL — ABNORMAL HIGH (ref 70–99)

## 2018-03-29 LAB — POCT PREGNANCY, URINE: Preg Test, Ur: NEGATIVE

## 2018-03-29 LAB — MRSA PCR SCREENING: MRSA by PCR: NEGATIVE

## 2018-03-29 LAB — ABO/RH: ABO/RH(D): O NEG

## 2018-03-29 SURGERY — BRONCHOSCOPY, FLEXIBLE
Anesthesia: General | Laterality: Right

## 2018-03-29 MED ORDER — MIDAZOLAM HCL 2 MG/2ML IJ SOLN
INTRAMUSCULAR | Status: DC | PRN
  Administered 2018-03-29: 2 mg via INTRAVENOUS

## 2018-03-29 MED ORDER — DEXAMETHASONE SODIUM PHOSPHATE 10 MG/ML IJ SOLN
INTRAMUSCULAR | Status: DC | PRN
  Administered 2018-03-29: 10 mg via INTRAVENOUS

## 2018-03-29 MED ORDER — HYDROMORPHONE HCL 1 MG/ML IJ SOLN
INTRAMUSCULAR | Status: AC
  Filled 2018-03-29: qty 1

## 2018-03-29 MED ORDER — OXYCODONE HCL 5 MG/5ML PO SOLN: 5.0000 mg | Freq: Once | ORAL | Status: DC | PRN

## 2018-03-29 MED ORDER — ROCURONIUM BROMIDE 100 MG/10ML IV SOLN
INTRAVENOUS | Status: DC | PRN
  Administered 2018-03-29: 10 mg via INTRAVENOUS
  Administered 2018-03-29: 40 mg via INTRAVENOUS

## 2018-03-29 MED ORDER — LIDOCAINE HCL (CARDIAC) PF 100 MG/5ML IV SOSY
PREFILLED_SYRINGE | INTRAVENOUS | Status: DC | PRN
  Administered 2018-03-29: 100 mg via INTRAVENOUS

## 2018-03-29 MED ORDER — CHLORHEXIDINE GLUCONATE CLOTH 2 % EX PADS: 6.0000 | MEDICATED_PAD | Freq: Once | CUTANEOUS | Status: DC

## 2018-03-29 MED ORDER — OXYCODONE-ACETAMINOPHEN 5-325 MG PO TABS
1.0000 | ORAL_TABLET | ORAL | Status: DC | PRN
  Administered 2018-03-29 – 2018-03-30 (×4): 2 via ORAL
  Filled 2018-03-29 (×5): qty 2

## 2018-03-29 MED ORDER — HYDROMORPHONE HCL 1 MG/ML IJ SOLN
INTRAMUSCULAR | Status: AC
  Administered 2018-03-29: 0.5 mg via INTRAVENOUS
  Filled 2018-03-29: qty 1

## 2018-03-29 MED ORDER — BUPIVACAINE LIPOSOME 1.3 % IJ SUSP
INTRAMUSCULAR | Status: AC
  Filled 2018-03-29: qty 20

## 2018-03-29 MED ORDER — BUPIVACAINE LIPOSOME 1.3 % IJ SUSP
INTRAMUSCULAR | Status: DC | PRN
  Administered 2018-03-29: 50 mL

## 2018-03-29 MED ORDER — PANTOPRAZOLE SODIUM 40 MG PO TBEC
40.0000 mg | DELAYED_RELEASE_TABLET | Freq: Every day | ORAL | Status: DC
  Administered 2018-03-30: 40 mg via ORAL
  Filled 2018-03-29 (×3): qty 1

## 2018-03-29 MED ORDER — BUPIVACAINE HCL (PF) 0.5 % IJ SOLN
INTRAMUSCULAR | Status: AC
  Filled 2018-03-29: qty 30

## 2018-03-29 MED ORDER — SERTRALINE HCL 50 MG PO TABS
150.0000 mg | ORAL_TABLET | ORAL | Status: DC
  Administered 2018-03-30 – 2018-04-02 (×4): 150 mg via ORAL
  Filled 2018-03-29 (×4): qty 3

## 2018-03-29 MED ORDER — PHENYLEPHRINE HCL 10 MG/ML IJ SOLN
INTRAMUSCULAR | Status: DC | PRN
  Administered 2018-03-29: 50 ug via INTRAVENOUS

## 2018-03-29 MED ORDER — METHOCARBAMOL 500 MG PO TABS
500.0000 mg | ORAL_TABLET | Freq: Four times a day (QID) | ORAL | Status: DC
  Administered 2018-03-29 – 2018-04-02 (×14): 500 mg via ORAL
  Filled 2018-03-29 (×18): qty 1

## 2018-03-29 MED ORDER — HYDROMORPHONE HCL 1 MG/ML IJ SOLN
1.0000 mg | Freq: Once | INTRAMUSCULAR | Status: AC
  Administered 2018-03-29: 1 mg via INTRAVENOUS

## 2018-03-29 MED ORDER — SUCCINYLCHOLINE CHLORIDE 20 MG/ML IJ SOLN
INTRAMUSCULAR | Status: DC | PRN
  Administered 2018-03-29: 100 mg via INTRAVENOUS

## 2018-03-29 MED ORDER — CEFAZOLIN SODIUM-DEXTROSE 2-4 GM/100ML-% IV SOLN
2.0000 g | Freq: Three times a day (TID) | INTRAVENOUS | Status: AC
  Administered 2018-03-29 – 2018-03-30 (×2): 2 g via INTRAVENOUS
  Filled 2018-03-29 (×2): qty 100

## 2018-03-29 MED ORDER — CEFAZOLIN SODIUM-DEXTROSE 2-4 GM/100ML-% IV SOLN
INTRAVENOUS | Status: AC
  Filled 2018-03-29: qty 100

## 2018-03-29 MED ORDER — LACTATED RINGERS IV SOLN
INTRAVENOUS | Status: DC | PRN
  Administered 2018-03-29 (×2): via INTRAVENOUS

## 2018-03-29 MED ORDER — SODIUM CHLORIDE 0.9 % IJ SOLN
INTRAMUSCULAR | Status: AC
  Filled 2018-03-29: qty 50

## 2018-03-29 MED ORDER — FENTANYL CITRATE (PF) 100 MCG/2ML IJ SOLN
INTRAMUSCULAR | Status: AC
  Filled 2018-03-29: qty 2

## 2018-03-29 MED ORDER — OXYCODONE HCL 5 MG PO TABS: 5.0000 mg | ORAL_TABLET | Freq: Once | ORAL | Status: DC | PRN

## 2018-03-29 MED ORDER — LACTATED RINGERS IV SOLN
INTRAVENOUS | Status: DC
  Administered 2018-03-29 (×3): via INTRAVENOUS

## 2018-03-29 MED ORDER — SODIUM CHLORIDE FLUSH 0.9 % IV SOLN
INTRAVENOUS | Status: AC
  Filled 2018-03-29: qty 10

## 2018-03-29 MED ORDER — MIDAZOLAM HCL 2 MG/2ML IJ SOLN
INTRAMUSCULAR | Status: AC
  Filled 2018-03-29: qty 2

## 2018-03-29 MED ORDER — ACETAMINOPHEN 10 MG/ML IV SOLN
INTRAVENOUS | Status: AC
  Filled 2018-03-29: qty 100

## 2018-03-29 MED ORDER — FENTANYL CITRATE (PF) 100 MCG/2ML IJ SOLN
INTRAMUSCULAR | Status: DC | PRN
  Administered 2018-03-29 (×2): 100 ug via INTRAVENOUS
  Administered 2018-03-29: 50 ug via INTRAVENOUS

## 2018-03-29 MED ORDER — MORPHINE SULFATE (PF) 2 MG/ML IV SOLN
1.0000 mg | INTRAVENOUS | Status: DC | PRN
  Administered 2018-03-29: 2 mg via INTRAVENOUS
  Filled 2018-03-29: qty 1

## 2018-03-29 MED ORDER — FENTANYL CITRATE (PF) 250 MCG/5ML IJ SOLN
INTRAMUSCULAR | Status: AC
  Filled 2018-03-29: qty 5

## 2018-03-29 MED ORDER — TRAMADOL HCL 50 MG PO TABS
50.0000 mg | ORAL_TABLET | Freq: Four times a day (QID) | ORAL | Status: DC
  Administered 2018-03-29 (×2): 50 mg via ORAL
  Filled 2018-03-29 (×3): qty 1

## 2018-03-29 MED ORDER — HYDROMORPHONE HCL 1 MG/ML IJ SOLN
1.0000 mg | INTRAMUSCULAR | Status: DC | PRN
  Administered 2018-03-30 – 2018-04-02 (×13): 1 mg via INTRAVENOUS
  Filled 2018-03-29 (×13): qty 1

## 2018-03-29 MED ORDER — BUPIVACAINE HCL (PF) 0.25 % IJ SOLN
INTRAMUSCULAR | Status: AC
  Filled 2018-03-29: qty 30

## 2018-03-29 MED ORDER — ALBUTEROL SULFATE (2.5 MG/3ML) 0.083% IN NEBU
2.5000 mg | INHALATION_SOLUTION | RESPIRATORY_TRACT | Status: DC
  Administered 2018-03-29: 2.5 mg via RESPIRATORY_TRACT
  Filled 2018-03-29: qty 3

## 2018-03-29 MED ORDER — HYDROMORPHONE HCL 1 MG/ML IJ SOLN
0.2500 mg | INTRAMUSCULAR | Status: DC | PRN
  Administered 2018-03-29 (×4): 0.5 mg via INTRAVENOUS

## 2018-03-29 MED ORDER — DEXTROSE-NACL 5-0.45 % IV SOLN
INTRAVENOUS | Status: DC
  Administered 2018-03-29: 21:00:00 via INTRAVENOUS

## 2018-03-29 MED ORDER — SUGAMMADEX SODIUM 500 MG/5ML IV SOLN
INTRAVENOUS | Status: DC | PRN
  Administered 2018-03-29: 150 mg via INTRAVENOUS

## 2018-03-29 MED ORDER — BISACODYL 5 MG PO TBEC
10.0000 mg | DELAYED_RELEASE_TABLET | Freq: Every day | ORAL | Status: DC
  Administered 2018-03-29 – 2018-04-02 (×4): 10 mg via ORAL
  Filled 2018-03-29 (×5): qty 2

## 2018-03-29 MED ORDER — EPHEDRINE SULFATE 50 MG/ML IJ SOLN
INTRAMUSCULAR | Status: DC | PRN
  Administered 2018-03-29: 10 mg via INTRAVENOUS

## 2018-03-29 MED ORDER — ONDANSETRON HCL 4 MG/2ML IJ SOLN
4.0000 mg | Freq: Four times a day (QID) | INTRAMUSCULAR | Status: DC | PRN
  Administered 2018-03-30 – 2018-04-01 (×4): 4 mg via INTRAVENOUS
  Filled 2018-03-29 (×4): qty 2

## 2018-03-29 MED ORDER — LISINOPRIL 5 MG PO TABS
10.0000 mg | ORAL_TABLET | ORAL | Status: DC
  Filled 2018-03-29: qty 2

## 2018-03-29 MED ORDER — MEPERIDINE HCL 50 MG/ML IJ SOLN: 6.2500 mg | INTRAMUSCULAR | Status: DC | PRN

## 2018-03-29 MED ORDER — FENTANYL CITRATE (PF) 100 MCG/2ML IJ SOLN
INTRAMUSCULAR | Status: AC
  Administered 2018-03-29: 50 ug via INTRAVENOUS
  Filled 2018-03-29: qty 2

## 2018-03-29 MED ORDER — FENTANYL CITRATE (PF) 100 MCG/2ML IJ SOLN
25.0000 ug | INTRAMUSCULAR | Status: DC | PRN
  Administered 2018-03-29 (×2): 50 ug via INTRAVENOUS
  Administered 2018-03-29 (×2): 25 ug via INTRAVENOUS

## 2018-03-29 MED ORDER — ONDANSETRON HCL 4 MG/2ML IJ SOLN
INTRAMUSCULAR | Status: DC | PRN
  Administered 2018-03-29: 4 mg via INTRAVENOUS

## 2018-03-29 MED ORDER — LIDOCAINE HCL (PF) 2 % IJ SOLN
INTRAMUSCULAR | Status: AC
  Filled 2018-03-29: qty 10

## 2018-03-29 MED ORDER — PROMETHAZINE HCL 25 MG/ML IJ SOLN: 6.2500 mg | INTRAMUSCULAR | Status: DC | PRN

## 2018-03-29 MED ORDER — PROPOFOL 10 MG/ML IV BOLUS
INTRAVENOUS | Status: DC | PRN
  Administered 2018-03-29: 140 mg via INTRAVENOUS

## 2018-03-29 MED ORDER — ACETAMINOPHEN 10 MG/ML IV SOLN
INTRAVENOUS | Status: DC | PRN
  Administered 2018-03-29: 1000 mg via INTRAVENOUS

## 2018-03-29 SURGICAL SUPPLY — 79 items
APL SKNCLS STERI-STRIP NONHPOA (GAUZE/BANDAGES/DRESSINGS) ×2
BENZOIN TINCTURE PRP APPL 2/3 (GAUZE/BANDAGES/DRESSINGS) ×3 IMPLANT
BNDG COHESIVE 4X5 TAN STRL (GAUZE/BANDAGES/DRESSINGS) ×2 IMPLANT
BRONCHOSCOPE PED SLIM DISP (MISCELLANEOUS) ×3 IMPLANT
CANISTER SUCT 1200ML W/VALVE (MISCELLANEOUS) ×3 IMPLANT
CATH THOR STR 28F  SOFT WA (CATHETERS) ×1
CATH THOR STR 28F SOFT WA (CATHETERS) IMPLANT
CATH URET ROBINSON 16FR STRL (CATHETERS) ×3 IMPLANT
CHLORAPREP W/TINT 26ML (MISCELLANEOUS) ×6 IMPLANT
CNTNR SPEC 2.5X3XGRAD LEK (MISCELLANEOUS) ×8
CONT SPEC 4OZ STER OR WHT (MISCELLANEOUS) ×4
CONT SPEC 4OZ STRL OR WHT (MISCELLANEOUS) ×8
CONTAINER SPEC 2.5X3XGRAD LEK (MISCELLANEOUS) ×8 IMPLANT
CUTTER ECHEON FLEX ENDO 45 340 (ENDOMECHANICALS) ×3 IMPLANT
DEFOGGER SCOPE WARMER CLEARIFY (MISCELLANEOUS) ×2 IMPLANT
DRAIN CHEST DRY SUCT SGL (MISCELLANEOUS) ×3 IMPLANT
DRAPE C-SECTION (MISCELLANEOUS) ×3 IMPLANT
DRAPE MAG INST 16X20 L/F (DRAPES) ×3 IMPLANT
DRAPE POUCH INSTRU U-SHP 10X18 (DRAPES) ×3 IMPLANT
DRSG OPSITE POSTOP 3X4 (GAUZE/BANDAGES/DRESSINGS) ×9 IMPLANT
DRSG OPSITE POSTOP 4X6 (GAUZE/BANDAGES/DRESSINGS) ×3 IMPLANT
DRSG OPSITE POSTOP 4X8 (GAUZE/BANDAGES/DRESSINGS) ×1 IMPLANT
DRSG TEGADERM 6X8 (GAUZE/BANDAGES/DRESSINGS) ×1 IMPLANT
DRSG TELFA 3X8 NADH (GAUZE/BANDAGES/DRESSINGS) ×3 IMPLANT
ELECT BLADE 6 FLAT ULTRCLN (ELECTRODE) ×3 IMPLANT
ELECT BLADE 6.5 EXT (BLADE) ×3 IMPLANT
ELECT CAUTERY BLADE TIP 2.5 (TIP) ×3
ELECT REM PT RETURN 9FT ADLT (ELECTROSURGICAL) ×3
ELECTRODE CAUTERY BLDE TIP 2.5 (TIP) ×2 IMPLANT
ELECTRODE REM PT RTRN 9FT ADLT (ELECTROSURGICAL) ×2 IMPLANT
GAUZE SPONGE 4X4 12PLY STRL (GAUZE/BANDAGES/DRESSINGS) ×3 IMPLANT
GLOVE SURG SYN 7.5  E (GLOVE) ×2
GLOVE SURG SYN 7.5 E (GLOVE) ×4 IMPLANT
GLOVE SURG SYN 7.5 PF PI (GLOVE) ×2 IMPLANT
GOWN STRL REUS W/ TWL LRG LVL3 (GOWN DISPOSABLE) ×8 IMPLANT
GOWN STRL REUS W/TWL LRG LVL3 (GOWN DISPOSABLE) ×12
KIT TURNOVER KIT A (KITS) ×3 IMPLANT
LABEL OR SOLS (LABEL) ×2 IMPLANT
LOOP RED MAXI  1X406MM (MISCELLANEOUS)
LOOP VESSEL MAXI 1X406 RED (MISCELLANEOUS) ×2 IMPLANT
MARKER SKIN DUAL TIP RULER LAB (MISCELLANEOUS) ×3 IMPLANT
NDL SPNL 20GX3.5 QUINCKE YW (NEEDLE) ×2 IMPLANT
NEEDLE SPNL 20GX3.5 QUINCKE YW (NEEDLE) ×3 IMPLANT
NS IRRIG 500ML POUR BTL (IV SOLUTION) ×3 IMPLANT
PACK BASIN MAJOR ARMC (MISCELLANEOUS) ×3 IMPLANT
PAD DRESSING TELFA 3X8 NADH (GAUZE/BANDAGES/DRESSINGS) ×2 IMPLANT
RELOAD STAPLE 45 GOLD REG/THCK (STAPLE) IMPLANT
RELOAD STAPLER LINE PROX 30 GR (STAPLE) ×2 IMPLANT
SCISSORS METZENBAUM CVD 33 (INSTRUMENTS) IMPLANT
SPONGE KITTNER 5P (MISCELLANEOUS) ×6 IMPLANT
STAPLE RELOAD 45MM GOLD (STAPLE) ×9 IMPLANT
STAPLER RELOAD LINE PROX 30 GR (STAPLE) ×3
STAPLER RELOADABLE 30 GRN THCK (STAPLE) ×2 IMPLANT
STAPLER SKIN PROX 35W (STAPLE) ×3 IMPLANT
STAPLER VASCULAR ECHELON 35 (CUTTER) ×2 IMPLANT
STRIP CLOSURE SKIN 1/2X4 (GAUZE/BANDAGES/DRESSINGS) ×3 IMPLANT
SUT ETHILON 4-0 (SUTURE)
SUT ETHILON 4-0 FS2 18XMFL BLK (SUTURE)
SUT MNCRL AB 3-0 PS2 27 (SUTURE) ×6 IMPLANT
SUT PROLENE 5 0 RB 1 DA (SUTURE) IMPLANT
SUT SILK 0 (SUTURE) ×3
SUT SILK 0 30XBRD TIE 6 (SUTURE) ×2 IMPLANT
SUT SILK 1 SH (SUTURE) ×21 IMPLANT
SUT VIC AB 0 CT1 36 (SUTURE) ×6 IMPLANT
SUT VIC AB 2-0 CT1 27 (SUTURE) ×6
SUT VIC AB 2-0 CT1 TAPERPNT 27 (SUTURE) ×4 IMPLANT
SUT VIC AB 2-0 CT2 27 (SUTURE) ×6 IMPLANT
SUT VICRYL 2 TP 1 (SUTURE) ×12 IMPLANT
SUTURE ETHLN 4-0 FS2 18XMF BLK (SUTURE) IMPLANT
SYR 10ML SLIP (SYRINGE) ×3 IMPLANT
SYR BULB IRRIG 60ML STRL (SYRINGE) ×3 IMPLANT
TAPE CLOTH 3X10 WHT NS LF (GAUZE/BANDAGES/DRESSINGS) ×2 IMPLANT
TAPE TRANSPORE STRL 2 31045 (GAUZE/BANDAGES/DRESSINGS) ×2 IMPLANT
TRAY FOLEY MTR SLVR 16FR STAT (SET/KITS/TRAYS/PACK) ×3 IMPLANT
TROCAR FLEXIPATH 20X80 (ENDOMECHANICALS) IMPLANT
TROCAR FLEXIPATH THORACIC 15MM (ENDOMECHANICALS) IMPLANT
TUBING CONNECTING 10 (TUBING) ×3 IMPLANT
WATER STERILE IRR 1000ML POUR (IV SOLUTION) ×3 IMPLANT
YANKAUER SUCT BULB TIP FLEX NO (MISCELLANEOUS) ×3 IMPLANT

## 2018-03-29 NOTE — Anesthesia Post-op Follow-up Note (Signed)
Anesthesia QCDR form completed.        

## 2018-03-29 NOTE — Transfer of Care (Signed)
Immediate Anesthesia Transfer of Care Note  Patient: Catherine Gilbert  Procedure(s) Performed: PREOP  BRONCHOSCOPY (N/A ) VIDEO ASSISTED THORACOSCOPY (VATS)/WEDGE RESECTION (Right ) THORACOTOMY MAJOR- POSSIBLE LOBECTOMY (Right )  Patient Location: PACU  Anesthesia Type:General  Level of Consciousness: awake and oriented  Airway & Oxygen Therapy: Patient Spontanous Breathing and Patient connected to face mask oxygen  Post-op Assessment: Report given to RN and Post -op Vital signs reviewed and stable  Post vital signs: Reviewed and stable  Last Vitals:  Vitals Value Taken Time  BP 151/95 03/29/2018  6:17 PM  Temp    Pulse 96 03/29/2018  6:19 PM  Resp 13 03/29/2018  6:19 PM  SpO2 100 % 03/29/2018  6:19 PM  Vitals shown include unvalidated device data.  Last Pain:  Vitals:   03/29/18 1135  TempSrc: Tympanic         Complications: No apparent anesthesia complications

## 2018-03-29 NOTE — Consult Note (Signed)
Name: Catherine Gilbert MRN: [id number] DOB: 30-Jul-1975    ADMISSION DATE:  03/29/2018 CONSULTATION DATE: 03/29/2018  REFERRING MD : Dr. Genevive Bi   CHIEF COMPLAINT: s/p  Right Thoracotomy   BRIEF PATIENT DESCRIPTION:  42 yo female with RLL nodule admitted to ICU s/p right thoracotomy with wedge resection of right lower lobe mass   SIGNIFICANT EVENTS/STUDIES:  09/30-Pt admitted to ICU s/p right thoracotomy   HISTORY OF PRESENT ILLNESS:   This is a 42 yo female with a PMH of HTN, HSV, Former Smoker, GERD, General Anxiety Disorder, Depression, and Anemia.  She has a hx of a right lower lobe nodule that has been followed for the last year, however it has grown from 6 mm to 12 mm in size.  Therefore, she presented to [facility] on 09/30 for a right thoracotomy with wedge resection of right lower lobe mass performed by Dr. Genevive Bi.  She was subsequently admitted to ICU postop for further workup and management.   PAST MEDICAL HISTORY :   has a past medical history of Abnormal Pap smear, Anemia, Anxiety, Anxiety state (04/23/2011), Depression, Dysmenorrhea (08/27/2011), Dyspnea on exertion (10/30/2015), Essential hypertension (09/15/2012), Family history of ASCVD (arteriosclerotic cardiovascular disease) (12/31/2016), Family history of premature CAD (11/11/2016), GAD (generalized anxiety disorder) (10/30/2015), GERD (gastroesophageal reflux disease) (05/29/2014), Headache (01/01/2017), HSV (herpes simplex virus) infection, Hypertension, Initiation of Depo Provera (09/24/2011), Lung nodule, and Migraine headache.  has a past surgical history that includes Cesarean section; Tubal ligation; Dilation and curettage of uterus; and Colposcopy (2010). Prior to Admission medications   Medication Sig Start Date End Date Taking? Authorizing Provider  ALPRAZolam (XANAX) 0.5 MG tablet TAKE 1 TABLET BY MOUTH AT BEDTIME AS NEEDED Patient taking differently: Take 0.5 mg by mouth at bedtime as needed for anxiety.  02/05/18  Yes Lucille Passy,  MD  Aspirin-Caffeine [facility] FAST PAIN RELIEF PO) Take 1 packet by mouth daily as needed (headache).   Yes [provider]  ferrous sulfate (FEROSUL) 325 (65 FE) MG tablet TAKE 1 TABLET(325 MG) BY MOUTH TWICE DAILY WITH A MEAL Patient taking differently: Take 325 mg by mouth daily.  08/27/17  Yes Lucille Passy, MD  lisinopril (PRINIVIL,ZESTRIL) 10 MG tablet Take 1 tablet (10 mg total) by mouth daily. Patient taking differently: Take 10 mg by mouth every morning.  02/26/18  Yes Lucille Passy, MD  Multiple Vitamin (MULTIVITAMIN WITH MINERALS) TABS tablet Take 2 tablets by mouth daily. Juice Plus fruit and vegetables   Yes [provider]  omeprazole (PRILOSEC) 40 MG capsule Take 40 mg by mouth every morning.  07/17/17  Yes [provider]  Phenylephrine-APAP-guaiFENesin (TYLENOL SINUS SEVERE PO) Take 2 tablets by mouth daily as needed (sinus headaches).   Yes [provider]  sertraline (ZOLOFT) 100 MG tablet Take 1.5 tabs qd Patient taking differently: Take 150 mg by mouth every morning.  10/06/17  Yes Lucille Passy, MD  valACYclovir (VALTREX) 500 MG tablet TAKE 2 TABLETS BY MOUTH TODAY, THEN 2 TABLETS TWICE DAILY AS NEEDED. Patient taking differently: Take 1,000 mg by mouth 2 (two) times daily as needed (fever blisters).  06/29/17  Yes Lucille Passy, MD  Diclofenac Sodium (PENNSAID) 2 % SOLN Place 1 application onto the skin 2 (two) times daily. Patient not taking: Reported on 03/17/2018 12/28/17   Rosemarie Ax, MD   No Known Allergies  FAMILY HISTORY:  family history includes Breast cancer in her cousin; Breast cancer (age of onset: 47) in her  maternal grandmother; Heart disease in her sister; Heart disease (age of onset: 59) in her brother; Heart disease (age of onset: 15) in her father; Hypertension in her father; Stroke in her sister; Stroke (age of onset: 81) in her mother. SOCIAL HISTORY:  reports that she quit smoking about 2 years ago. Her smoking use  included cigarettes. She has a 25.00 pack-year smoking history. She has never used smokeless tobacco. She reports that she drinks alcohol. She reports that she does not use drugs.  REVIEW OF SYSTEMS: Positives in BOLD  Constitutional: Negative for fever, chills, weight loss, malaise/fatigue and diaphoresis.  HENT: Negative for hearing loss, ear pain, nosebleeds, congestion, sore throat, neck pain, tinnitus and ear discharge.   Eyes: Negative for blurred vision, double vision, photophobia, pain, discharge and redness.  Respiratory: Negative for cough, hemoptysis, sputum production, shortness of breath, wheezing and stridor.   Cardiovascular: Negative for chest pain, palpitations, orthopnea, claudication, leg swelling and PND.  Gastrointestinal: Negative for heartburn, nausea, vomiting, abdominal pain, diarrhea, constipation, blood in stool and melena.  Genitourinary: Negative for dysuria, urgency, frequency, hematuria and flank pain.  Musculoskeletal: myalgias, right upper back pain at incision sites, joint pain and falls.  Skin: Negative for itching and rash.  Neurological: Negative for dizziness, tingling, tremors, sensory change, speech change, focal weakness, seizures, loss of consciousness, weakness and headaches.  Endo/Heme/Allergies: Negative for environmental allergies and polydipsia. Does not bruise/bleed easily.  SUBJECTIVE:  c/o of right upper back pain   VITAL SIGNS: Temp:  [97.3 F (36.3 C)-97.9 F (36.6 C)] 97.9 F (36.6 C) (09/30 1939) Pulse Rate:  [76-108] 91 (09/30 1948) Resp:  [8-20] 19 (09/30 1948) BP: (111-151)/(65-95) 113/75 (09/30 1948) SpO2:  [95 %-100 %] 98 % (09/30 1948) Weight:  [78.4 kg] 78.4 kg (09/30 1135)  PHYSICAL EXAMINATION: General: well developed, well nourished female, NAD  Neuro: alert and oriented, follows commands  HEENT: supple, no JVD Cardiovascular: nsr, rrr, no R/G Lungs: diminished throughout greater on right, even, non labored; right sided  chest tube draining serosanguinous fluid  Abdomen: +BS x4, soft, non tender, non distended  Musculoskeletal: normal bulk and tone, no edema  Skin: right sided chest tube incision site, 2 incision sites right upper back dressings with dried drainage   Recent Labs  Lab 03/23/18 1144  NA 137  K 3.8  CL 104  CO2 26  BUN 19  CREATININE 0.74  GLUCOSE 99   Recent Labs  Lab 03/23/18 1144  HGB 12.1  HCT 35.1  WBC 8.8  PLT 224   Dg Chest Port 1 View  Result Date: 03/29/2018 CLINICAL DATA:  Post op chest tube placement EXAM: PORTABLE CHEST 1 VIEW COMPARISON:  03/23/2018 FINDINGS: Interval placement of RIGHT-sided chest tube. Surgical clips are identified along the LATERAL aspect of the RIGHT lung. There is small amount of subcutaneous gas. No definite pneumothorax. In the LEFT lung is clear. Heart size is normal. IMPRESSION: Interval placement of chest tube.  Postoperative changes. No evidence for pneumothorax. Electronically Signed   By: Nolon Nations M.D.   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

## 2018-03-29 NOTE — Progress Notes (Signed)
Patient ID: Catherine Gilbert, female   DOB: [birth year], 42 y.o.   MRN: [id number]  No chief complaint on file.   HPI Catherine Gilbert is a 42 y.o. female.  She has a right lower lobe nodule that has been followed for the last year.  It has grown from 6 mm to 12 mm in size.  She was last seen in the office about 1 month ago at which time she was given the options for biopsy, surgery or radiation therapy.  She has elected to pursue surgical therapy.  I have reviewed with her extensively the indications and risks of right thoracotomy and lung resection.  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  She is very hesitant to undergo an extensive resection if she can avoid that.  She would like [place] to proceed with a sublobar approach if we can.  She currently is taking lisinopril for hypertension and has no allergies.  Her pulmonary function studies were done earlier this year and are normal.   Past Medical History:  Diagnosis Date  . Abnormal Pap smear   . Anemia   . Anxiety   . Anxiety state 04/23/2011  . Depression   . Dysmenorrhea 08/27/2011  . Dyspnea on exertion 10/30/2015  . Essential hypertension 09/15/2012  . Family history of ASCVD (arteriosclerotic cardiovascular disease) 12/31/2016  . Family history of premature CAD 11/11/2016  . GAD (generalized anxiety disorder) 10/30/2015  . GERD (gastroesophageal reflux disease) 05/29/2014  . Headache 01/01/2017  . HSV (herpes simplex virus) infection   . Hypertension   . Initiation of Depo Provera 09/24/2011  . Lung nodule    RLL on coronary calcium score CT  . Migraine headache     Past Surgical History:  Procedure Laterality Date  . CESAREAN SECTION     x 3   . COLPOSCOPY  2010  . DILATION AND CURETTAGE OF UTERUS    . TUBAL LIGATION      Family History  Problem Relation Age of Onset  . Stroke Mother 33   massive stroke  . Heart disease Father 63       congestive heart failure  . Hypertension Father   . Heart disease Brother 26       Suspected MI and SCD  . Heart disease Sister   . Stroke Sister   . Breast cancer Maternal Grandmother 52  . Breast cancer Cousin        x2 paternal    Social History Social History   Tobacco Use  . Smoking status: Former Smoker    Packs/day: 1.00    Years: 25.00    Pack years: 25.00    Types: Cigarettes    Last attempt to quit: 11/22/2015    Years since quitting: 2.3  . Smokeless tobacco: Never Used  Substance Use Topics  . Alcohol use: Yes    Comment: OCC  . Drug use: No    No Known Allergies  Current Facility-Administered Medications  Medication Dose Route Frequency Provider Last Rate Last Dose  . ceFAZolin (ANCEF) 2-4 GM/100ML-% IVPB           . ceFAZolin (ANCEF) IVPB 2g/100 mL premix  2 g Intravenous On Call to [place], MD      . Chlorhexidine Gluconate Cloth 2 % PADS 6 each  6 each Topical Once Nestor Lewandowsky, MD  And  . Chlorhexidine Gluconate Cloth 2 % PADS 6 each  6 each Topical Once Nestor Lewandowsky, MD      . lactated ringers infusion   Intravenous Continuous Alvin Critchley, MD      . sodium chloride flush 0.9 % injection             Location, Quality, Duration, Severity, Timing, Context, Modifying Factors, Associated Signs and Symptoms.  Review of Systems A complete review of systems was asked and was negative except for the following positive findings none  Blood pressure 112/74, pulse 87, temperature (!) 97.3 F (36.3 C), temperature source Tympanic, resp. rate 16, height 5\' 4"  (1.626 m), weight 78.4 kg, last menstrual period 03/21/2018, SpO2 99 %.  Physical Exam CONSTITUTIONAL:  Pleasant, well-developed, well-nourished, and in no acute distress. EYES: Pupils equal and reactive to light, Sclera non-icteric EARS, NOSE, MOUTH AND THROAT:  The oropharynx was clear.  Dentition is good repair.  Oral mucosa pink and  moist. LYMPH NODES:  Lymph nodes in the neck and axillae were normal RESPIRATORY:  Lungs were clear.  Normal respiratory effort without pathologic use of accessory muscles of respiration CARDIOVASCULAR: Heart was regular without murmurs.  There were no carotid bruits. GI: The abdomen was soft, nontender, and nondistended. There were no palpable masses. There was no hepatosplenomegaly. There were normal bowel sounds in all quadrants. GU:   MUSCULOSKELETAL:  Normal muscle strength and tone.  No clubbing or cyanosis.   SKIN:  There were no pathologic skin lesions.  There were no nodules on palpation. NEUROLOGIC:  Sensation is normal.  Cranial nerves are grossly intact. PSYCH:  Oriented to person, place and time.  Mood and affect are normal.  Data Reviewed CT scans and chest x-rays.  I have personally reviewed the patient's imaging and medical records.    Assessment    I have independently reviewed all of her films.  I discussed them again with her today about the options.  She understands that a wedge resection may be considered an adequate and some series.  However she would like to pursue that if it is possible.    Plan    We plan a right thoracotomy and wedge resection with frozen section analysis.  If possible we will stop at that point.  She understands the implications and the risks.      Nestor Lewandowsky, MD 03/29/2018, 12:17 PM  Patient ID: Catherine Gilbert, female   DOB: [birth year], 42 y.o.   MRN: [id number]

## 2018-03-29 NOTE — Op Note (Signed)
  03/29/2018  6:21 PM  PATIENT:  Catherine Gilbert  42 y.o. 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  She had an attempted a percutaneous biopsy which was unsuccessful and she was apprised of the indications and risks of right thoracotomy with lung resection.  Risks of recurrence with a wedge resection versus lobectomy were discussed.  Risks of surgery including bleeding, infection, air leak and death were all reviewed.  After extensive evaluation the patient was offered the above named procedure for definitive diagnosis and treatment.  DICTATION: The patient was brought to the operating suite and placed in supine position.  General endotracheal anesthesia was given with a double-lumen tube.  Preoperative bronchoscopy was carried out.  This was normal to the subsegmental levels bilaterally.  The tube was positioned in the left mainstem bronchus and secured.  There was no evidence of any tumor or purulent secretions present.  The patient was then turned for a right thoracotomy.  All pressure points were carefully padded.  The patient was prepped and draped in usual sterile fashion.  A posterior lateral fifth interspace thoracotomy was performed by dividing the latissimus but sparing the serratus.  Upon entering the chest we freed up the inferior pulmonary ligament to the level of the inferior pulmonary vein.  Careful palpation of the right lower lobe revealed a small 5 to 6 mm area in the right lower  lobe below the fissure in approximately the midportion of the lung.  This did not appear to be within the superior segment but more in the basilar segments.  We opened the fissure slightly to allow the lung to be elevated up out of the wound.  Using multiple firings of an endoscopic stapler we took we felt was a wide 2 cm margin around the area in question.  This was sent to the pathologist for frozen section.  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  The margins were clean.  At this point we elected to stop as the patient's desire was to proceed with his minimal resection as possible.  A single 90 French chest tube was positioned through an inferior incision and brought up to the apex of the chest.  The mediastinum was opened and to subcarinal nodes were identified as well as one paraesophageal node.  These were sent for analysis.  Hemostasis was complete and then the chest was closed.  #2 Vicryl pericostal sutures were used to approximate the ribs.  Exparel was used as a paravertebral block and in the intercostal spaces.  The serratus muscles allowed to return to its normal anatomic position.  The latissimus muscle was then approximated with #2 Vicryl.  Subcutaneous tissues were closed with 2-0 Vicryl and the skin with a 4-0 Monocryl.  Steri-Strips were applied.  The patient was then extubated and taken to the recovery room in stable condition.   Nestor Lewandowsky, MD

## 2018-03-29 NOTE — Anesthesia Procedure Notes (Signed)
Procedure Name: Intubation Date/Time: 03/29/2018 4:45 PM Performed by: Allean Found, CRNA Pre-anesthesia Checklist: Patient identified, Patient being monitored, Timeout performed, Emergency Drugs available and Suction available Patient Re-evaluated:Patient Re-evaluated prior to induction Oxygen Delivery Method: Circle system utilized Preoxygenation: Pre-oxygenation with 100% oxygen Induction Type: IV induction Ventilation: Mask ventilation without difficulty Laryngoscope Size: Mac and 3 Grade View: Grade I Tube type: Oral Endobronchial tube: Left, Double lumen EBT, EBT position confirmed by fiberoptic bronchoscope and EBT position confirmed by auscultation and 37 Fr Number of attempts: 1 Airway Equipment and Method: Stylet Placement Confirmation: ETT inserted through vocal cords under direct vision,  positive ETCO2 and breath sounds checked- equal and bilateral Secured at: 21 cm Tube secured with: Tape Dental Injury: Teeth and Oropharynx as per pre-operative assessment

## 2018-03-29 NOTE — Anesthesia Postprocedure Evaluation (Signed)
Anesthesia Post Note  Patient: Catherine Gilbert  Procedure(s) Performed: PREOP  BRONCHOSCOPY (N/A ) VIDEO ASSISTED THORACOSCOPY (VATS)/WEDGE RESECTION (Right ) THORACOTOMY MAJOR- POSSIBLE LOBECTOMY (Right )  Patient location during evaluation: PACU Anesthesia Type: General Level of consciousness: awake and alert Pain management: pain level controlled Vital Signs Assessment: post-procedure vital signs reviewed and stable Respiratory status: spontaneous breathing, nonlabored ventilation, respiratory function stable and patient connected to nasal cannula oxygen Cardiovascular status: blood pressure returned to baseline and stable Postop Assessment: no apparent nausea or vomiting Anesthetic complications: no     Last Vitals:  Vitals:   03/29/18 1948 03/29/18 2005  BP: 113/75 115/81  Pulse: 91 88  Resp: 19 14  Temp:  37.2 C  SpO2: 98% 98%    Last Pain:  Vitals:   03/29/18 2005  TempSrc: Oral  PainSc: [place]

## 2018-03-29 NOTE — Anesthesia Preprocedure Evaluation (Signed)
Anesthesia Evaluation  Patient identified by MRN, date of birth, ID band Patient awake    Reviewed: Allergy & Precautions, NPO status , Patient's Chart, lab work & pertinent test results  History of Anesthesia Complications Negative for: history of anesthetic complications  Airway Mallampati: II  TM Distance: >3 FB Neck ROM: Full    Dental no notable dental hx.    Pulmonary neg sleep apnea, neg COPD, former smoker,    breath sounds clear to auscultation- rhonchi (-) wheezing      Cardiovascular Exercise Tolerance: Good hypertension, Pt. on medications (-) CAD, (-) Past MI, (-) Cardiac Stents and (-) CABG  Rhythm:Regular Rate:Normal - Systolic murmurs and - Diastolic murmurs FH of sudden cardiac death, has had workup with cardiology     Neuro/Psych  Headaches, PSYCHIATRIC DISORDERS Anxiety Depression    GI/Hepatic Neg liver ROS, GERD  ,  Endo/Other  negative endocrine ROSneg diabetes  Renal/GU negative Renal ROS     Musculoskeletal negative musculoskeletal ROS (+)   Abdominal (+) - obese,   Peds  Hematology  (+) anemia ,   Anesthesia Other Findings Past Medical History: No date: Abnormal Pap smear No date: Anemia No date: Anxiety 04/23/2011: Anxiety state No date: Depression 08/27/2011: Dysmenorrhea 10/30/2015: Dyspnea on exertion 09/15/2012: Essential hypertension 12/31/2016: Family history of ASCVD (arteriosclerotic cardiovascular  disease) 11/11/2016: Family history of premature CAD 10/30/2015: GAD (generalized anxiety disorder) 05/29/2014: GERD (gastroesophageal reflux disease) 01/01/2017: Headache No date: HSV (herpes simplex virus) infection No date: Hypertension 09/24/2011: Initiation of Depo Provera No date: Lung nodule     Comment:  RLL on coronary calcium score CT No date: Migraine headache   Reproductive/Obstetrics                             Lab Results  Component Value Date    WBC 8.8 03/23/2018   HGB 12.1 03/23/2018   HCT 35.1 03/23/2018   MCV 90.5 03/23/2018   PLT 224 03/23/2018    Anesthesia Physical Anesthesia Plan  ASA: II  Anesthesia Plan: General   Post-op Pain Management:    Induction: Intravenous  PONV Risk Score and Plan: 2 and Ondansetron, Dexamethasone and Midazolam  Airway Management Planned: Oral ETT and Double Lumen EBT  Additional Equipment:   Intra-op Plan:   Post-operative Plan: Extubation in OR and Possible Post-op intubation/ventilation  Informed Consent: I have reviewed the patients History and Physical, chart, labs and discussed the procedure including the risks, benefits and alternatives for the proposed anesthesia with the patient or authorized representative who has indicated his/her understanding and acceptance.   Dental advisory given  Plan Discussed with: CRNA and Anesthesiologist  Anesthesia Plan Comments: (Discussed possible arterial line, possible need for postop intubation and ventilation)        Anesthesia Quick Evaluation

## 2018-03-30 ENCOUNTER — Encounter: Payer: Self-pay | Admitting: Cardiothoracic Surgery

## 2018-03-30 DIAGNOSIS — R079 Chest pain, unspecified: Secondary | ICD-10-CM

## 2018-03-30 LAB — CBC WITH DIFFERENTIAL/PLATELET
Basophils Absolute: 0 10*3/uL (ref 0–0.1)
Basophils Relative: 0 %
Eosinophils Absolute: 0 10*3/uL (ref 0–0.7)
Eosinophils Relative: 0 %
HCT: 35 % (ref 35.0–47.0)
Hemoglobin: 12.3 g/dL (ref 12.0–16.0)
Lymphocytes Relative: 4 %
Lymphs Abs: 0.6 10*3/uL — ABNORMAL LOW (ref 1.0–3.6)
MCH: 32.1 pg (ref 26.0–34.0)
MCHC: 35 g/dL (ref 32.0–36.0)
MCV: 91.8 fL (ref 80.0–100.0)
Monocytes Absolute: 0.5 10*3/uL (ref 0.2–0.9)
Monocytes Relative: 4 %
Neutro Abs: 12 10*3/uL — ABNORMAL HIGH (ref 1.4–6.5)
Neutrophils Relative %: 92 %
Platelets: 202 10*3/uL (ref 150–440)
RBC: 3.82 MIL/uL (ref 3.80–5.20)
RDW: 12.7 % (ref 11.5–14.5)
WBC: 13.1 10*3/uL — ABNORMAL HIGH (ref 3.6–11.0)

## 2018-03-30 LAB — TYPE AND SCREEN
ABO/RH(D): O NEG
Antibody Screen: NEGATIVE
Unit division: 0
Unit division: 0

## 2018-03-30 LAB — BPAM RBC
Blood Product Expiration Date: 201911052359
Blood Product Expiration Date: 201911052359
Unit Type and Rh: 9500
Unit Type and Rh: 9500

## 2018-03-30 LAB — BASIC METABOLIC PANEL
Anion gap: 7 (ref 5–15)
BUN: 11 mg/dL (ref 6–20)
CO2: 27 mmol/L (ref 22–32)
Calcium: 8.8 mg/dL — ABNORMAL LOW (ref 8.9–10.3)
Chloride: 104 mmol/L (ref 98–111)
Creatinine, Ser: 0.7 mg/dL (ref 0.44–1.00)
GFR calc Af Amer: >60 mL/min (ref >60)
GFR calc non Af Amer: >60 mL/min (ref >60)
Glucose, Bld: 165 mg/dL — ABNORMAL HIGH (ref 70–99)
Potassium: 4.3 mmol/L (ref 3.5–5.1)
Sodium: 138 mmol/L (ref 135–145)

## 2018-03-30 LAB — PREPARE RBC (CROSSMATCH)

## 2018-03-30 LAB — TROPONIN I: Troponin I: <0.03 ng/mL (ref <0.03)

## 2018-03-30 MED ORDER — TRAMADOL HCL 50 MG PO TABS
100.0000 mg | ORAL_TABLET | Freq: Four times a day (QID) | ORAL | Status: DC
  Administered 2018-03-30 – 2018-04-02 (×13): 100 mg via ORAL
  Filled 2018-03-30 (×13): qty 2

## 2018-03-30 MED ORDER — ALBUTEROL SULFATE (2.5 MG/3ML) 0.083% IN NEBU
2.5000 mg | INHALATION_SOLUTION | Freq: Four times a day (QID) | RESPIRATORY_TRACT | Status: DC
  Administered 2018-03-30 (×3): 2.5 mg via RESPIRATORY_TRACT
  Filled 2018-03-30 (×5): qty 3

## 2018-03-30 MED ORDER — ACETAMINOPHEN 325 MG PO TABS: 650.0000 mg | ORAL_TABLET | Freq: Four times a day (QID) | ORAL | Status: DC | PRN

## 2018-03-30 MED ORDER — OXYCODONE HCL 5 MG PO TABS
5.0000 mg | ORAL_TABLET | ORAL | Status: DC | PRN
  Administered 2018-03-30 – 2018-03-31 (×5): 10 mg via ORAL
  Filled 2018-03-30 (×5): qty 2

## 2018-03-30 NOTE — Progress Notes (Signed)
Pt refused 2000 hr tx, stated she does not take breathing tx at home.

## 2018-03-30 NOTE — Progress Notes (Signed)
Per Dr Genevive Bi ok to disconnect suction - gravity drainage for transport.

## 2018-03-30 NOTE — Evaluation (Signed)
Physical Therapy Evaluation Patient Details Name: Catherine Gilbert MRN: [id number] DOB: [birth year] Today's Date: 03/30/2018   History of Present Illness  Patient is a 42 year old female s/p right thoracotomy for right lower lobe mass.    Clinical Impression  Patient received sitting up in recliner, husband in room. Patient agreeable to PT. Patient performed seated LE exercises with reported LE fatigue. Assisted patient to standing, patient able to take 2-3 steps forward and back x 5 reps without AD. Reports fatigue with this as well. Performed 3 additional exercises in long sitting with good tolerance however reports she is starting to have pain in right shoulder. RN notified. Patient will benefit from continued PT to address her weakness and decreased activity tolerance.        Follow Up Recommendations Home health PT    Equipment Recommendations  None recommended by PT    Recommendations for Other Services       Precautions / Restrictions Precautions Precautions: Other (comment) Precaution Comments: chest tube to remain on suction  Restrictions Weight Bearing Restrictions: No      Mobility  Bed Mobility               General bed mobility comments: Patient received up in recliner  Transfers Overall transfer level: Needs assistance   Transfers: Sit to/from Stand Sit to Stand: Min guard         General transfer comment: Patient weak, requires assistance for safety when transferring.   Ambulation/Gait Ambulation/Gait assistance: Min guard Gait Distance (Feet): 4 Feet Assistive device: None   Gait velocity: decreased   General Gait Details: patient is unable to be disconnected from suction ( per MD order), therefore ambulation limited to 2-3 steps forward and back x 5 reps. Patient fatigued with this.   Stairs            Wheelchair Mobility    Modified Rankin (Stroke Patients Only)       Balance Overall balance assessment: Modified  Independent Sitting-balance support: Feet supported Sitting balance-Leahy Scale: Good       Standing balance-Leahy Scale: Good Standing balance comment: Patient demosntrates independence with standing and taking a few steps, close supervision required for safety.                              Pertinent Vitals/Pain Pain Assessment: Faces Faces Pain Scale: Hurts little more Pain Location: right shoulder area Pain Descriptors / Indicators: Grimacing;Operative site guarding;Aching Pain Intervention(s): Limited activity within patient's tolerance;Patient requesting pain meds-RN notified;Monitored during session    Home Living Family/patient expects to be discharged to:: Private residence Living Arrangements: Spouse/significant other Available Help at Discharge: Family Type of Home: House         Home Equipment: None      Prior Function Level of Independence: Independent         Comments: Patient working, driving, fully independent prior to admission.      Hand Dominance        Extremity/Trunk Assessment        Lower Extremity Assessment Lower Extremity Assessment: Generalized weakness       Communication   Communication: No difficulties  Cognition Arousal/Alertness: Awake/alert Behavior During Therapy: WFL for tasks assessed/performed Overall Cognitive Status: Within Functional Limits for tasks assessed  General Comments      Exercises General Exercises - Lower Extremity Ankle Circles/Pumps: AROM;15 reps;Both;Seated Long Arc Quad: AROM;15 reps;Both;Seated Hip ABduction/ADduction: AROM;15 reps;Both;Seated Hip Flexion/Marching: AROM;15 reps;Other (comment);Both;Seated(seated)   Assessment/Plan    PT Assessment Patient needs continued PT services  PT Problem List Decreased strength;Decreased activity tolerance;Decreased mobility;Pain       PT Treatment Interventions Gait  training;Therapeutic exercise;Functional mobility training;Stair training;Patient/family education;Therapeutic activities    PT Goals (Current goals can be found in the Care Plan section)  Acute Rehab PT Goals Patient Stated Goal: to return to work, do activities she was doing before.   PT Goal Formulation: With patient Time For Goal Achievement: 04/13/18 Potential to Achieve Goals: Good    Frequency Min 2X/week   Barriers to discharge        Co-evaluation               AM-PAC PT "6 Clicks" Daily Activity  Outcome Measure Difficulty turning over in bed (including adjusting bedclothes, sheets and blankets)?: Unable Difficulty moving from lying on back to sitting on the side of the bed? : Unable Difficulty sitting down on and standing up from a chair with arms (e.g., wheelchair, bedside commode, etc,.)?: Unable Help needed moving to and from a bed to chair (including a wheelchair)?: A Little Help needed walking in hospital room?: A Little Help needed climbing 3-5 steps with a railing? : A Lot 6 Click Score: 11    End of Session Equipment Utilized During Treatment: Gait belt Activity Tolerance: Patient limited by fatigue;Patient limited by pain Patient left: in chair;with call bell/phone within reach;with family/visitor present Nurse Communication: Mobility status;Patient requests pain meds PT Visit Diagnosis: Muscle weakness (generalized) (M62.81);Pain;Other abnormalities of gait and mobility (R26.89) Pain - Right/Left: Right Pain - part of body: Shoulder    Time: 1017-5102 PT Time Calculation (min) (ACUTE ONLY): 20 min   Charges:   PT Evaluation $PT Eval Moderate Complexity: 1 Mod PT Treatments $Therapeutic Exercise: 8-22 mins        Danton Palmateer, PT, GCS 03/30/18,1:01 PM

## 2018-03-30 NOTE — Care Management Note (Signed)
Case Management Note  Patient Details  Name: [name] MRN: [id number] Date of Birth: 1975-12-26  Subjective/Objective:                 Admitted after right thoracotomy for adenocarcinoma of right lung.  Prior to this admission, independent in all her alds, no issues with transportation or accessing medical care. Right chest tube present. Has been out of bed to the chair.  Physical therapy has assessed and at present is recommending home health. This recommendation could change as patient continues through the post op course.   Action/Plan:   Expected Discharge Date:                  Expected Discharge Plan:     In-House Referral:     Discharge planning Services     Post Acute Care Choice:    Choice offered to:     DME Arranged:    DME Agency:     HH Arranged:    HH Agency:     Status of Service:     If discussed at [employer] of Stay Meetings, dates discussed:    Additional Comments:  Katrina Stack, RN 03/30/2018, 4:18 PM

## 2018-03-30 NOTE — Progress Notes (Signed)
Patient ID: Catherine Gilbert, female   DOB: March 30, 1976, 42 y.o.   MRN: [id number]   Had some issues with pain last evening but feels better this morning with sitting in the chair   VSS, Afeb  Small air leak from chest tube.  Seronsanguinous drainage  Lungs clear.  Heart regular  Will transfer to floor.  Will hold lisinopril for today.  Will encourage ambulation but needs to stay on suction.  [employer].

## 2018-03-30 NOTE — Progress Notes (Signed)
Report called to [facility]. Patient is alert and oriented. Complaints of right shoulder pain early this am that has been reoccurring throughout the day. EKG and troponin performed. Pain medication given. Dr. Genevive Bi made aware by patient. Chest tube to the right side. Decreased PO intake. Adequate urine output. Chest tube -20 suction. Output 20 ml. Patient up to chair this am and feels better then lying in bed. Husband at bedside. Patient tx to floor.

## 2018-03-31 MED ORDER — OXYCODONE-ACETAMINOPHEN 5-325 MG PO TABS
1.0000 | ORAL_TABLET | ORAL | Status: DC | PRN
  Administered 2018-03-31 – 2018-04-02 (×6): 2 via ORAL
  Filled 2018-03-31 (×6): qty 2

## 2018-03-31 NOTE — Progress Notes (Signed)
Patient ID: Catherine Gilbert, female   DOB: [birth year], 42 y.o.   MRN: [id number]    She had a very quiet evening.  She did have some discomfort around her shoulder.  She states that her breathing is good and she is not short of breath.  Her lungs are clear bilaterally.  I do not appreciate an air leak today.  Her heart is regular.  Her only other new issues that she was unable to pass urine for the last several hours.  We will bladder scan her and straight catheter if there is more than 200 cc present.  I will also get a chest x-ray tomorrow morning.  We will attempt to wean her oxygen today and discontinue her heart monitor.  This will make her more mobile overall.  Pathology did confirm the presence of an adenocarcinoma of the lung.  Final staging is still pending however.

## 2018-03-31 NOTE — Evaluation (Addendum)
Physical Therapy Treatment Patient Details Name: Catherine Gilbert MRN: [id number] DOB: 1976/06/13 Today's Date: 03/31/2018   History of Present Illness  Patient admitted from home s/p R thoracotomy of an adenocarcinoma.  PMH includes Htn, HSV, anxiety disorder, CAD, arteriorsclerotic CVD, depression and anemia.  Clinical Impression  Patient demonstrated progress in ambulatory distance today (75 ft) and was able to sit at EOB and perform seated there ex.  Pt was able to perform bed mobility mod I and demonstrated ability to stand from bedside with hands placed lightly over PT's.  Pt did, however, require a RW to ambulate due to increased pain and tendency to return to a flexed posture to manage.  RN and pt's husband assisted in mobilizing portable suction.  Pt ambulated without O2 and measured 85% O2 at conclusion, recovering quickly with rest.  PT introduced some seated there ex for pt to do and discussed importance of frequent movement for the healing process.  Pt expressed understanding and stated that she had some muscle soreness in her hip adductors from PT the day before.  Pt will continue to benefit from skilled PT with focus on strength, tolerance to activity, safe use of RW and pain management.  Home health PT remains appropriate for discharge plan.    Follow Up Recommendations Home health PT    Equipment Recommendations  Rolling walker with 5" wheels(Pt will need a RW if she does not have one available at home.)    Recommendations for Other Services       Precautions / Restrictions Precautions Precautions: Other (comment) Precaution Comments: chest tube to remain on suction  Restrictions Weight Bearing Restrictions: No      Mobility  Bed Mobility Overal bed mobility: Modified Independent             General bed mobility comments: Moves slowly but able to sit upright with use of bed rail.  Transfers Overall transfer level: Needs assistance Equipment used: Rolling walker  (2 wheeled) Transfers: Sit to/from Stand Sit to Stand: Min assist         General transfer comment: Min A to rise from bedside; pt very vocal about pain.  Ambulation/Gait Ambulation/Gait assistance: Min assist;+2 safety/equipment Gait Distance (Feet): 75 Feet       Gait velocity interpretation: <1.8 ft/sec, indicate of risk for recurrent falls General Gait Details: Low foot clearance and step length, flexed posture guarding operative site and fair use of RW.  Pt took several standing rest breaks but was able to return to recliner in room.  PT provided VC's for management of RW during turns.  Stairs            Wheelchair Mobility    Modified Rankin (Stroke Patients Only)       Balance Overall balance assessment: Needs assistance Sitting-balance support: Feet supported Sitting balance-Leahy Scale: Good       Standing balance-Leahy Scale: Good Standing balance comment: Required RW today due to pain and flexed posture.                             Pertinent Vitals/Pain Pain Assessment: Faces Faces Pain Scale: Hurts little more Pain Location: R upper thoracic area near surgical site. Pain Intervention(s): Limited activity within patient's tolerance    Home Living                        Prior Function  Hand Dominance        Extremity/Trunk Assessment                Communication      Cognition Arousal/Alertness: Awake/alert Behavior During Therapy: WFL for tasks assessed/performed Overall Cognitive Status: Within Functional Limits for tasks assessed                                 General Comments: Follows commands consistently.        General Comments      Exercises General Exercises - Lower Extremity Ankle Circles/Pumps: 20 reps;Supine;Both Quad Sets: Both;10 reps;Supine Long Arc Quad: Strengthening;Both;10 reps;Seated Hip ABduction/ADduction: Seated;Both;Strengthening;10  reps Other Exercises Other Exercises: sit to stands x5 with use of UE's for support and close CGA, Min hand held assist.   Assessment/Plan    PT Assessment    PT Problem List         PT Treatment Interventions      PT Goals (Current goals can be found in the Care Plan section)  Acute Rehab PT Goals Patient Stated Goal: to return to work, do activities she was doing before.   PT Goal Formulation: With patient Time For Goal Achievement: 04/20/18 Potential to Achieve Goals: Good    Frequency Min 2X/week   Barriers to discharge        Co-evaluation               AM-PAC PT "6 Clicks" Daily Activity  Outcome Measure Difficulty turning over in bed (including adjusting bedclothes, sheets and blankets)?: A Lot Difficulty moving from lying on back to sitting on the side of the bed? : A Lot Difficulty sitting down on and standing up from a chair with arms (e.g., wheelchair, bedside commode, etc,.)?: A Lot Help needed moving to and from a bed to chair (including a wheelchair)?: A Little Help needed walking in hospital room?: A Little Help needed climbing 3-5 steps with a railing? : A Lot 6 Click Score: 14    End of Session Equipment Utilized During Treatment: Gait belt;Oxygen Activity Tolerance: Patient limited by fatigue;Patient limited by pain Patient left: in chair;with call bell/phone within reach;with family/visitor present Nurse Communication: Mobility status;Patient requests pain meds PT Visit Diagnosis: Muscle weakness (generalized) (M62.81);Pain;Other abnormalities of gait and mobility (R26.89) Pain - Right/Left: Right Pain - part of body: Shoulder    Time: 5621-3086 PT Time Calculation (min) (ACUTE ONLY): 28 min   Charges:     PT Treatments $Therapeutic Exercise: 8-22 mins $Therapeutic Activity: 8-22 mins        Roxanne Gates, PT, DPT   Roxanne Gates 03/31/2018, 1:14 PM, Addended 1640, 03/31/2018

## 2018-04-01 ENCOUNTER — Other Ambulatory Visit: Payer: Self-pay | Admitting: Cardiothoracic Surgery

## 2018-04-01 ENCOUNTER — Inpatient Hospital Stay: Payer: BLUE CROSS/BLUE SHIELD

## 2018-04-01 LAB — SURGICAL PATHOLOGY

## 2018-04-01 MED ORDER — GABAPENTIN 300 MG PO CAPS
300.0000 mg | ORAL_CAPSULE | Freq: Three times a day (TID) | ORAL | Status: DC
  Administered 2018-04-01 – 2018-04-02 (×4): 300 mg via ORAL
  Filled 2018-04-01 (×4): qty 1

## 2018-04-01 MED ORDER — OMEPRAZOLE 20 MG PO CPDR
40.0000 mg | DELAYED_RELEASE_CAPSULE | Freq: Every day | ORAL | Status: DC
  Administered 2018-04-01 – 2018-04-02 (×2): 40 mg via ORAL
  Filled 2018-04-01 (×2): qty 2

## 2018-04-01 MED ORDER — ALUM & MAG HYDROXIDE-SIMETH 200-200-20 MG/5ML PO SUSP
30.0000 mL | Freq: Once | ORAL | Status: AC
  Administered 2018-04-01: 30 mL via ORAL
  Filled 2018-04-01: qty 30

## 2018-04-01 MED ORDER — KETOROLAC TROMETHAMINE 30 MG/ML IJ SOLN
30.0000 mg | Freq: Four times a day (QID) | INTRAMUSCULAR | Status: DC
  Administered 2018-04-01 – 2018-04-02 (×5): 30 mg via INTRAVENOUS
  Filled 2018-04-01 (×5): qty 1

## 2018-04-01 NOTE — Progress Notes (Signed)
PT Cancellation Note  Patient Details Name: Catherine Gilbert MRN: [id number] DOB: [birth year]   Cancelled Treatment:    Reason Eval/Treat Not Completed: Patient declined, no reason specified. Treatment attempted; pt up in chair. Pt declines therapy noting she has been up recently and ambulated with spouse and nursing assistant around "circle" (B pod). Pt wishes to do this again, but at a later time with spouse, despite offer. Pt/spouse encouraged and agreed to make sure staff member is with them.   Larae Grooms, PTA 04/01/2018, 5:15 PM

## 2018-04-01 NOTE — Progress Notes (Signed)
PT Cancellation Note  Patient Details Name: Catherine Gilbert MRN: [id number] DOB: 1975-12-30   Cancelled Treatment:    Reason Eval/Treat Not Completed: Pain limiting ability to participate   Session offered.  Pt in recliner in general discomfort.  Stated RN was going to give her pain medication then walk with her in a little while.  Will attempt again after lunch as schedule allows.   Chesley Noon 04/01/2018, 11:30 AM

## 2018-04-01 NOTE — Progress Notes (Signed)
[facility] Progress Note  3 Days Post-Op  Subjective: She notes continued right-sided chest pain worse with ambulation. No SOB. She was able to wean off O2. No fevers.   She also endorses hiccups starting yesterday, no abdominal pain nausea, or emesis. Tolerating a diet. No flatus.    Objective: Vital signs in last 24 hours: Temp:  [98 F (36.7 C)-98.3 F (36.8 C)] 98.3 F (36.8 C) (10/03 0328) Pulse Rate:  [79-96] 96 (10/03 0257) Resp:  [20] 20 (10/02 1959) BP: (110-137)/(82-86) 110/85 (10/03 0257) SpO2:  [90 %-98 %] 98 % (10/03 0257) Last BM Date: 03/27/18  Intake/Output from previous day: 10/02 0701 - 10/03 0700 In: 360 [P.O.:360] Out: 1580 [Urine:1500; Chest Tube:80] Intake/Output this shift: Total I/O In: -  Out: 500 [Urine:500]  PE: Gen:  Alert, NAD, pleasant Card:  Regular rate and rhythm, no m/r/g Pulm:  Normal effort, clear to auscultation bilaterally, slightly diminished Abd: Soft, non-tender, non-distended, Skin: warm and dry, no rashes  Psych: A&Ox3   Lab Results:  Recent Labs    03/29/18 2043 03/30/18 0438  WBC 17.4* 13.1*  HGB 12.9 12.3  HCT 36.6 35.0  PLT 223 202   BMET Recent Labs    03/29/18 2043 03/30/18 0438  NA 136 138  K 3.9 4.3  CL 103 104  CO2 27 27  GLUCOSE 124* 165*  BUN 15 11  CREATININE 0.70 0.70  CALCIUM 8.8* 8.8*   PT/INR No results for input(s): LABPROT, INR in the last 72 hours. CMP     Component Value Date/Time   NA 138 03/30/2018 0438   K 4.3 03/30/2018 0438   CL 104 03/30/2018 0438   CO2 27 03/30/2018 0438   GLUCOSE 165 (H) 03/30/2018 0438   BUN 11 03/30/2018 0438   CREATININE 0.70 03/30/2018 0438   CREATININE 0.76 04/23/2011 1040   CALCIUM 8.8 (L) 03/30/2018 0438   PROT 6.8 03/23/2018 1144   ALBUMIN 4.3 03/23/2018 1144   AST 19 03/23/2018 1144   ALT 24 03/23/2018 1144   ALKPHOS 34 (L) 03/23/2018 1144   BILITOT 0.6 03/23/2018 1144   GFRNONAA >60 03/30/2018 0438   GFRAA >60  03/30/2018 0438   Lipase  No results found for: LIPASE     Studies/Results: Dg Chest Port 1 View  Result Date: 04/01/2018 CLINICAL DATA:  Postop check.  Post right thoracotomy. EXAM: PORTABLE CHEST 1 VIEW COMPARISON:  Radiograph 03/29/2018. FINDINGS: Right chest tube in place with tip at the apex. No visualized pneumothorax. Decreased subcutaneous emphysema the right chest wall. Chain sutures in the right lower lung zone with adjacent ill-defined opacity, likely atelectasis. No large pleural effusion. Mild streaky atelectasis at the left lung base. Normal heart size and mediastinal contours. No pulmonary edema. IMPRESSION: Right chest tube in place without pneumothorax. Streaky bibasilar opacities favor atelectasis. Electronically Signed   By: Keith Rake M.D.   On: 04/01/2018 04:03    Anti-infectives: Anti-infectives (From admission, onward)   Start     Dose/Rate Route Frequency Ordered Stop   03/29/18 2200  ceFAZolin (ANCEF) IVPB 2g/100 mL premix     2 g 200 mL/hr over 30 Minutes Intravenous Every 8 hours 03/29/18 2005 03/30/18 1633   03/29/18 1130  ceFAZolin (ANCEF) 2-4 GM/100ML-% IVPB    Note to Pharmacy:  Garfield Cornea   : cabinet override      03/29/18 1130 03/29/18 1613   03/29/18 0600  ceFAZolin (ANCEF) IVPB 2g/100 mL premix     2 g 200  mL/hr over 30 Minutes Intravenous On call to O.R. 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 Only 80 ccs from tube in last 24 hours. CXR without PTX. Will place on water seal today and get CXR in the AM.  - Add gabapentin and Toradol for pain control - Mobilize as tolerate - Encouraged aggressive pulmonary toilet, IS use.     LOS: 3 days    Edison Simon , PA-C [facility] 04/01/2018, 11:02 AM [phone] M-F: 7am - 4pm

## 2018-04-02 ENCOUNTER — Inpatient Hospital Stay: Payer: BLUE CROSS/BLUE SHIELD

## 2018-04-02 MED ORDER — GABAPENTIN 300 MG PO CAPS: 300.0000 mg | ORAL_CAPSULE | Freq: Three times a day (TID) | ORAL | 0 refills | Status: DC

## 2018-04-02 MED ORDER — METHOCARBAMOL 500 MG PO TABS: 500.0000 mg | ORAL_TABLET | Freq: Four times a day (QID) | ORAL | 0 refills | Status: DC | PRN

## 2018-04-02 MED ORDER — OXYCODONE-ACETAMINOPHEN 5-325 MG PO TABS: 1.0000 | ORAL_TABLET | Freq: Four times a day (QID) | ORAL | 0 refills | Status: DC | PRN

## 2018-04-02 NOTE — Progress Notes (Signed)
Patient cleared for discharge. Education complete. AVS printed. Discharge instructions given. All questions answered for patient clarification.  Prescriptions given, pharmacy verified.  IV removed.  Discharged home via POV

## 2018-04-02 NOTE — Progress Notes (Signed)
[facility] Progress Note  4 Days Post-Op  Subjective: She is still having right chest soreness but she feels this is significantly improved. She was able to ambulate around the unit multiple times without difficulty. Chest tube was to water seal yesterday.   Objective: Vital signs in last 24 hours: Temp:  [97.6 F (36.4 C)-97.9 F (36.6 C)] 97.6 F (36.4 C) (10/04 0406) Pulse Rate:  [73-95] 80 (10/04 0406) Resp:  [16-17] 16 (10/04 0406) BP: (123-134)/(78-85) 123/78 (10/04 0406) SpO2:  [93 %-98 %] 98 % (10/04 0406) Last BM Date: 03/27/18  Intake/Output from previous day: 10/03 0701 - 10/04 0700 In: -  Out: 5732 [Urine:1400; Chest Tube:70] Intake/Output this shift: No intake/output data recorded.  PE: Gen:  Alert, NAD, pleasant Card:  Regular rate and rhythm, pedal pulses 2+ BL Pulm:  Normal effort, clear to auscultation bilaterally, chest tube in right lateral chest wall. Skin: warm and dry, no rashes  Psych: A&Ox3   Lab Results:  No results for input(s): WBC, HGB, HCT, PLT in the last 72 hours. BMET No results for input(s): NA, K, CL, CO2, GLUCOSE, BUN, CREATININE, CALCIUM in the last 72 hours. PT/INR No results for input(s): LABPROT, INR in the last 72 hours. CMP     Component Value Date/Time   NA 138 03/30/2018 0438   K 4.3 03/30/2018 0438   CL 104 03/30/2018 0438   CO2 27 03/30/2018 0438   GLUCOSE 165 (H) 03/30/2018 0438   BUN 11 03/30/2018 0438   CREATININE 0.70 03/30/2018 0438   CREATININE 0.76 04/23/2011 1040   CALCIUM 8.8 (L) 03/30/2018 0438   PROT 6.8 03/23/2018 1144   ALBUMIN 4.3 03/23/2018 1144   AST 19 03/23/2018 1144   ALT 24 03/23/2018 1144   ALKPHOS 34 (L) 03/23/2018 1144   BILITOT 0.6 03/23/2018 1144   GFRNONAA >60 03/30/2018 0438   GFRAA >60 03/30/2018 0438   Lipase  No results found for: LIPASE     Studies/Results: Dg Chest Port 1 View  Result Date: 04/02/2018 CLINICAL DATA:  Chest tube in place. EXAM: PORTABLE  CHEST 1 VIEW COMPARISON:  Radiograph of April 01, 2018. FINDINGS: The heart size and mediastinal contours are within normal limits. Right-sided chest tube is unchanged in position. No pneumothorax or significant pleural effusion is noted. Stable bibasilar subsegmental atelectasis is noted. The visualized skeletal structures are unremarkable. IMPRESSION: Stable right-sided chest tube without pneumothorax. Stable bibasilar subsegmental atelectasis. Electronically Signed   By: Marijo Conception, M.D.   On: 04/02/2018 07:16   Dg Chest Port 1 View  Result Date: 04/01/2018 CLINICAL DATA:  Postop check.  Post right thoracotomy. EXAM: PORTABLE CHEST 1 VIEW COMPARISON:  Radiograph 03/29/2018. FINDINGS: Right chest tube in place with tip at the apex. No visualized pneumothorax. Decreased subcutaneous emphysema the right chest wall. Chain sutures in the right lower lung zone with adjacent ill-defined opacity, likely atelectasis. No large pleural effusion. Mild streaky atelectasis at the left lung base. Normal heart size and mediastinal contours. No pulmonary edema. IMPRESSION: Right chest tube in place without pneumothorax. Streaky bibasilar opacities favor atelectasis. Electronically Signed   By: Keith Rake M.D.   On: 04/01/2018 04:03    Anti-infectives: Anti-infectives (From admission, onward)   Start     Dose/Rate Route Frequency Ordered Stop   03/29/18 2200  ceFAZolin (ANCEF) IVPB 2g/100 mL premix     2 g 200 mL/hr over 30 Minutes Intravenous Every 8 hours 03/29/18 2005 03/30/18 1633   03/29/18 1130  ceFAZolin (ANCEF) 2-4 GM/100ML-% IVPB    Note to Pharmacy:  Garfield Cornea   : cabinet override      03/29/18 1130 03/29/18 1613   03/29/18 0600  ceFAZolin (ANCEF) IVPB 2g/100 mL premix     2 g 200 mL/hr over 30 Minutes Intravenous On call to O.R. 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  - Will remove chest tube today.  - If pain controlled adequately, will be able to d/c home this afternoon - Mobilize as tolerate - Encouraged aggressive pulmonary toilet, IS use.     LOS: 4 days    Edison Simon , PA-C [facility] 04/02/2018, 8:34 AM [phone] M-F: 7am - 4pm

## 2018-04-02 NOTE — Care Management Note (Signed)
Case Management Note  Patient Details  Name: Catherine Gilbert MRN: [id number] Date of Birth: [birth year]   Patient to discharge today.  PT has assessed patient and recommends home health PT and RW.  Patient declines services at discharge, and does not wish to have a RW, PA is in agreement.  Patient has follow up appointment scheduled at the cancer center on Wednesday. Husband to provide transportation to appointments.   RNCM signing off.    Subjective/Objective:                    Action/Plan:   Expected Discharge Date:  04/02/18               Expected Discharge Plan:  Home/Self Care  In-House Referral:     Discharge planning Services     Post Acute Care Choice:    Choice offered to:     DME Arranged:    DME Agency:     HH Arranged:  Patient Refused [place] Agency:     Status of Service:  Completed, signed off  If discussed at [employer] of Stay Meetings, dates discussed:    Additional Comments:  Beverly Sessions, RN 04/02/2018, 2:24 PM

## 2018-04-02 NOTE — Discharge Summary (Signed)
Discharge Summary  Patient ID: Catherine Gilbert MRN: [id number] DOB/AGE: May 25, 1976 42 y.o.  Admit date: 03/29/2018 Discharge date: 04/02/2018  Discharge Diagnoses Right Upper Lobe Mass  Consultants CCM  Procedures right thoracotomy with wedge resection right lower lobe mass on 09/30  HPI: Catherine Gilbert is a 42 y.o. female.  She has a right lower lobe nodule that has been followed for the last year.  It has grown from 6 mm to 12 mm in size.  She was last seen in the office about 1 month ago at which time she was given the options for biopsy, surgery or radiation therapy.  She has elected to pursue surgical therapy.  I have reviewed with her extensively the indications and risks of right thoracotomy and lung resection.  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 She is very hesitant to undergo an extensive resection if she can avoid that. She would like [place] to proceed with a sublobar approach if we can. She currently is taking lisinopril for hypertension and has no allergies. Her pulmonary function studies were done earlier this year and are normal.   Hospital Course: She underwent right thoracotomy with wedge resection right lower lobe mass with Dr. Genevive Bi for right lower lobe mass. In the immediate post-operative period she was in the ICU for monitoring. On POD1, she was transferred to the med-surg floor and her chest tube output and function were monitored. She was progressively weaned from her O2. Her biggest post-operative issue was pain management. On POD4, her chest tube drainage had slowed and she remained without PTX on CXR, and her chest tube was removed. Her diet ws advanced which she tolerated without adverse reaction.   On the day of discharge (10/04), she was tolerating a diet, her pain was better controlled, and she was mobilizing well. She was educated  on restrictions including driving and lifting restrictions as well as wound care instructions. She is to follow up with Dr. Genevive Bi in 1 week. All of her questions and concerns were addressed and answered.     Allergies as of 04/02/2018   No Known Allergies     Medication List    TAKE these medications   ALPRAZolam 0.5 MG tablet Commonly known as:  XANAX TAKE 1 TABLET BY MOUTH AT BEDTIME AS NEEDED What changed:  reasons to take this   BC FAST PAIN RELIEF PO Take 1 packet by mouth daily as needed (headache).   Diclofenac Sodium 2 % Soln Place 1 application onto the skin 2 (two) times daily.   ferrous sulfate 325 (65 FE) MG tablet TAKE 1 TABLET(325 MG) BY MOUTH TWICE DAILY WITH A MEAL What changed:    how much to take  how to take this  when to take this  additional instructions   gabapentin 300 MG capsule Commonly known as:  NEURONTIN Take 1 capsule (300 mg total) by mouth 3 (three) times daily.   lisinopril 10 MG tablet Commonly known as:  PRINIVIL,ZESTRIL Take 1 tablet (10 mg total) by mouth daily. What changed:  when to take this   methocarbamol 500 MG tablet Commonly known as:  ROBAXIN Take 1 tablet (500 mg total) by mouth every 6 (six) hours as needed for muscle spasms.   multivitamin with minerals Tabs tablet Take 2 tablets by mouth daily. Juice Plus fruit and vegetables   omeprazole 40 MG  capsule Commonly known as:  PRILOSEC Take 40 mg by mouth every morning.   oxyCODONE-acetaminophen 5-325 MG tablet Commonly known as:  PERCOCET/ROXICET Take 1-2 tablets by mouth every 6 (six) hours as needed for moderate pain or severe pain.   sertraline 100 MG tablet Commonly known as:  ZOLOFT Take 1.5 tabs qd What changed:    how much to take  how to take this  when to take this  additional instructions   TYLENOL SINUS SEVERE PO Take 2 tablets by mouth daily as needed (sinus headaches).   valACYclovir 500 MG tablet Commonly known as:  VALTREX TAKE 2  TABLETS BY MOUTH TODAY, THEN 2 TABLETS TWICE DAILY AS NEEDED. What changed:    how much to take  how to take this  when to take this  reasons to take this  additional instructions            Discharge Care Instructions  (From admission, onward)         Start     Ordered   04/02/18 0000  Discharge wound care:    Comments:  Change dressing every other day Do not submerge wound in water Okay to shower Sunday   04/02/18 1406           Follow-up Information    Nestor Lewandowsky, MD. Schedule an appointment as soon as possible for a visit in 1 week(s).   Specialties:  Cardiothoracic Surgery, General Surgery Why:  Make an appointment with Dr. Genevive Bi for 1 wk...s/p thoracotomy and wedge resection Contact information: [address] [place] [address] [phone]           Signed: Edison Simon , Hershal Coria [facility]  04/02/2018, 2:06 PM [phone] M-F: 7am - 4pm

## 2018-04-05 ENCOUNTER — Other Ambulatory Visit: Payer: Self-pay

## 2018-04-05 DIAGNOSIS — R911 Solitary pulmonary nodule: Secondary | ICD-10-CM

## 2018-04-05 NOTE — Progress Notes (Unsigned)
Patient notified to have chest xray prior to appointment.

## 2018-04-06 ENCOUNTER — Encounter: Payer: Self-pay | Admitting: *Deleted

## 2018-04-06 NOTE — Progress Notes (Signed)
  Oncology Nurse Navigator Documentation  Navigator Location: [facility] (04/06/18 1200) Referral date to RadOnc/MedOnc: 04/02/18 (04/06/18 1200) )Navigator Encounter Type: Introductory phone call (04/06/18 1200)   Abnormal Finding Date: 02/15/18 (04/06/18 1200) Confirmed Diagnosis Date: 04/01/18 (04/06/18 1200) Surgery Date: 03/29/18 (04/06/18 1200)           Treatment Initiated Date: 03/29/18 (04/06/18 1200)   Treatment Phase: Post-Tx Follow-up (04/06/18 1200) Barriers/Navigation Needs: Coordination of Care (04/06/18 1200)   Interventions: Coordination of Care (04/06/18 1200)   Coordination of Care: Appts (04/06/18 1200)    Support Groups/Services: Kids [facility] (04/06/18 1200)   Acuity: Level 2 (04/06/18 1200)   Acuity Level 2: Initial guidance, education and coordination as needed;Educational needs;Assistance expediting appointments (04/06/18 1200)  phone call made to patient to introduce to navigator services. Questions answered during phone call regarding upcoming appts. Pt states is doing well but thinks pulled a muscle trying to get out of chair yesterday. States is trying to rest today to see if symptoms resolve. Pt has upcoming appt with Dr. Genevive Bi on Friday for follow up as well. Pt informed of children policy at cancer center. Instructed to call with any further questions or concerns. Pt verbalized understanding. Nothing further needed at this time.    Time Spent with Patient: 30 (04/06/18 1200)

## 2018-04-07 ENCOUNTER — Inpatient Hospital Stay: Payer: BLUE CROSS/BLUE SHIELD | Attending: Internal Medicine | Admitting: Internal Medicine

## 2018-04-07 ENCOUNTER — Encounter: Payer: Self-pay | Admitting: Internal Medicine

## 2018-04-07 ENCOUNTER — Other Ambulatory Visit: Payer: Self-pay

## 2018-04-07 VITALS — BP 141/96 | HR 81 | Temp 98.1°F | Resp 20 | Ht 64.0 in | Wt 167.0 lb

## 2018-04-07 DIAGNOSIS — Z8249 Family history of ischemic heart disease and other diseases of the circulatory system: Secondary | ICD-10-CM | POA: Insufficient documentation

## 2018-04-07 DIAGNOSIS — C3431 Malignant neoplasm of lower lobe, right bronchus or lung: Secondary | ICD-10-CM | POA: Insufficient documentation

## 2018-04-07 DIAGNOSIS — Z79899 Other long term (current) drug therapy: Secondary | ICD-10-CM | POA: Diagnosis not present

## 2018-04-07 DIAGNOSIS — Z7982 Long term (current) use of aspirin: Secondary | ICD-10-CM | POA: Insufficient documentation

## 2018-04-07 DIAGNOSIS — Z87891 Personal history of nicotine dependence: Secondary | ICD-10-CM | POA: Insufficient documentation

## 2018-04-07 DIAGNOSIS — I10 Essential (primary) hypertension: Secondary | ICD-10-CM | POA: Insufficient documentation

## 2018-04-07 DIAGNOSIS — Z803 Family history of malignant neoplasm of breast: Secondary | ICD-10-CM | POA: Diagnosis not present

## 2018-04-07 NOTE — Assessment & Plan Note (Addendum)
#   Stage I adenocarcinoma the lung/lipidic pattern.  Wedge resection clear margins; no role for any adjuvant chemotherapy or radiation therapy.  #Recommend surveillance scans; given the stage I tumor recommend a scan in 1 year.  Pathology imaging reviewed the tumor conference.  # follow up to be decided.   Thank you Dr. Genevive Bi for allowing me to participate in the care of your pleasant patient. Please do not hesitate to contact me with questions or concerns in the interim.  Addendum: Discussed at the tumor conference; plan above.  Plan follow-up in 1 year CT scan prior.  Cc; Dr.Taalia.

## 2018-04-07 NOTE — Progress Notes (Signed)
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  #Quit smoking  DIAGNOSIS: Right upper lobe lung cancer  STAGE: 1       ;GOALS: Cure  CURRENT/MOST RECENT THERAPY : Surveillance      Primary cancer of right lower lobe of lung ([place])   04/07/2018 Initial Diagnosis    Primary cancer of right lower lobe of lung ([facility])      HISTORY OF PRESENTING ILLNESS:  Catherine Gilbert 42 y.o.  female long-standing history of smoking quit approximately 2 years ago has been referred to [place] for further evaluation recommendations for newly diagnosed lung cancer.  Patient states that her brother recently died of heart attack; which led to her physical exam.  During the physical- on cxr she was noted to have a right lung nodule right upper lobe.  Subsequent CT scans this patient was noted to have slight growth of the lung nodule.  Patient went on to have a wedge resection.   Patient is recovering well from surgery.  Complains of mild pain at the surgical site.  Otherwise denies any cough or shortness of breath chest pain.  Review of Systems  Constitutional: Negative for chills, diaphoresis, fever, malaise/fatigue and weight loss.  HENT: Negative for nosebleeds and sore throat.   Eyes: Negative for double vision.  Respiratory: Negative for cough, hemoptysis, sputum production, shortness of breath and wheezing.   Cardiovascular: Negative for chest pain, palpitations, orthopnea and leg swelling.  Gastrointestinal: Negative for abdominal pain, blood in stool, constipation, diarrhea, heartburn, melena, nausea and vomiting.  Genitourinary: Negative for dysuria, frequency and urgency.  Musculoskeletal:  Negative for back pain and joint pain.  Skin: Negative.  Negative for itching and rash.  Neurological: Negative for dizziness, tingling, focal weakness, weakness and headaches.  Endo/Heme/Allergies: Does not bruise/bleed easily.  Psychiatric/Behavioral: Negative for depression. The patient is not nervous/anxious and does not have insomnia.      MEDICAL HISTORY:  Past Medical History:  Diagnosis Date  . Abnormal Pap smear   . Anemia   . Anxiety   . Anxiety state 04/23/2011  . Depression   . Dysmenorrhea 08/27/2011  . Dyspnea on exertion 10/30/2015  . Essential hypertension 09/15/2012  . Family history of ASCVD (arteriosclerotic cardiovascular disease) 12/31/2016  . Family history of premature CAD 11/11/2016  . GAD (generalized anxiety disorder) 10/30/2015  . GERD (gastroesophageal reflux disease) 05/29/2014  . Headache 01/01/2017  . HSV (herpes simplex virus) infection   . Hypertension   . Initiation of Depo Provera 09/24/2011  . Lung nodule    RLL on coronary calcium score CT  . Migraine headache     SURGICAL HISTORY: Past Surgical History:  Procedure Laterality Date  . CESAREAN SECTION     x 3   . COLPOSCOPY  2010  . DILATION AND CURETTAGE OF UTERUS    . FLEXIBLE BRONCHOSCOPY N/A 03/29/2018   Procedure: PREOP  BRONCHOSCOPY;  Surgeon: Nestor Lewandowsky, MD;  Location: [facility] OR[facility];  Service: Thoracic;  Laterality: N/A;  . THORACOTOMY Right 03/29/2018   Procedure: THORACOTOMY MAJOR- POSSIBLE LOBECTOMY;  Surgeon: Nestor Lewandowsky, MD;  Location: [facility];  Service: Thoracic;  Laterality: Right;  . TUBAL LIGATION    . VIDEO ASSISTED THORACOSCOPY (VATS)/WEDGE  RESECTION Right 03/29/2018   Procedure: VIDEO ASSISTED THORACOSCOPY (VATS)/WEDGE RESECTION;  Surgeon: Nestor Lewandowsky, MD;  Location: [facility];  Service: Thoracic;  Laterality: Right;    SOCIAL HISTORY: lives in [place]; 3 childeresn; quit smoking; in may 2017 [ since 15 until 1ppd].  Social History   Socioeconomic History  . Marital  status: Married    Spouse name: Not on file  . Number of children: Not on file  . Years of education: Not on file  . Highest education level: Not on file  Occupational History  . Not on file  Social Needs  . Financial resource strain: Not on file  . Food insecurity:    Worry: Not on file    Inability: Not on file  . Transportation needs:    Medical: Not on file    Non-medical: Not on file  Tobacco Use  . Smoking status: Former Smoker    Packs/day: 1.00    Years: 25.00    Pack years: 25.00    Types: Cigarettes    Last attempt to quit: 11/22/2015    Years since quitting: 2.3  . Smokeless tobacco: Never Used  Substance and Sexual Activity  . Alcohol use: Yes    Comment: OCC  . Drug use: No  . Sexual activity: Yes    Birth control/protection: Surgical    Comment: tubalization  Lifestyle  . Physical activity:    Days per week: Not on file    Minutes per session: Not on file  . Stress: Not on file  Relationships  . Social connections:    Talks on phone: Not on file    Gets together: Not on file    Attends religious service: Not on file    Active member of club or organization: Not on file    Attends meetings of clubs or organizations: Not on file    Relationship status: Not on file  . Intimate partner violence:    Fear of current or ex partner: Not on file    Emotionally abused: Not on file    Physically abused: Not on file    Forced sexual activity: Not on file  Other Topics Concern  . Not on file  Social History Narrative  . Not on file    FAMILY HISTORY: Family History  Problem Relation Age of Onset  . Stroke Mother 33       massive stroke  . Heart disease Father 28       congestive heart failure  . Hypertension Father   . Heart disease Brother 65       Suspected MI and SCD  . Heart disease Sister   . Stroke Sister   . Breast cancer Maternal Grandmother 70  . Breast cancer Cousin        x2 paternal    ALLERGIES:  has No Known  Allergies.  MEDICATIONS:  Current Outpatient Medications  Medication Sig Dispense Refill  . ALPRAZolam (XANAX) 0.5 MG tablet TAKE 1 TABLET BY MOUTH AT BEDTIME AS NEEDED (Patient taking differently: Take 0.5 mg by mouth at bedtime as needed for anxiety. ) 30 tablet 2  . ferrous sulfate (FEROSUL) 325 (65 FE) MG tablet TAKE 1 TABLET(325 MG) BY MOUTH TWICE DAILY WITH A MEAL (Patient taking differently: Take 325 mg by mouth daily. ) 180 tablet 0  . gabapentin (NEURONTIN) 300 MG capsule Take 1 capsule (300 mg total) by mouth 3 (three) times daily. 90 capsule 0  . methocarbamol (ROBAXIN) 500 MG tablet Take  1 tablet (500 mg total) by mouth every 6 (six) hours as needed for muscle spasms. 30 tablet 0  . Multiple Vitamin (MULTIVITAMIN WITH MINERALS) TABS tablet Take 2 tablets by mouth daily. Juice Plus fruit and vegetables    . omeprazole (PRILOSEC) 40 MG capsule Take 40 mg by mouth every morning.     Marland Kitchen oxyCODONE-acetaminophen (PERCOCET/ROXICET) 5-325 MG tablet Take 1-2 tablets by mouth every 6 (six) hours as needed for moderate pain or severe pain. 30 tablet 0  . sertraline (ZOLOFT) 100 MG tablet Take 1.5 tabs qd (Patient taking differently: Take 150 mg by mouth every morning. ) 135 tablet 3  . Aspirin-Caffeine (BC FAST PAIN RELIEF PO) Take 1 packet by mouth daily as needed (headache).    . lisinopril (PRINIVIL,ZESTRIL) 10 MG tablet Take 1 tablet (10 mg total) by mouth daily. 90 tablet 0  . oxyCODONE (ROXICODONE) 5 MG immediate release tablet Take 1 tablet (5 mg total) by mouth every 6 (six) hours as needed. 20 tablet 0  . Phenylephrine-APAP-guaiFENesin (TYLENOL SINUS SEVERE PO) Take 2 tablets by mouth daily as needed (sinus headaches).    . valACYclovir (VALTREX) 500 MG tablet TAKE 2 TABLETS BY MOUTH TODAY, THEN 2 TABLETS TWICE DAILY AS NEEDED. 60 tablet 0   No current facility-administered medications for this visit.       Marland Kitchen  PHYSICAL EXAMINATION: ECOG PERFORMANCE STATUS: 0 -  Asymptomatic  Vitals:   04/07/18 1521  BP: (!) 141/96  Pulse: 81  Resp: 20  Temp: 98.1 F (36.7 C)   Filed Weights   04/07/18 1521  Weight: 167 lb (75.8 kg)    Physical Exam  Constitutional: She is oriented to person, place, and time and well-developed, well-nourished, and in no distress.  HENT:  Head: Normocephalic and atraumatic.  Mouth/Throat: Oropharynx is clear and moist. No oropharyngeal exudate.  Eyes: Pupils are equal, round, and reactive to light.  Neck: Normal range of motion. Neck supple.  Cardiovascular: Normal rate and regular rhythm.  Pulmonary/Chest: No respiratory distress. She has no wheezes.  Abdominal: Soft. Bowel sounds are normal. She exhibits no distension and no mass. There is no tenderness. There is no rebound and no guarding.  Musculoskeletal: Normal range of motion. She exhibits no edema or tenderness.  Neurological: She is alert and oriented to person, place, and time.  Skin: Skin is warm.  Psychiatric: Affect normal.     LABORATORY DATA:  I have reviewed the data as listed Lab Results  Component Value Date   WBC 13.1 (H) 03/30/2018   HGB 12.3 03/30/2018   HCT 35.0 03/30/2018   MCV 91.8 03/30/2018   PLT 202 03/30/2018   Recent Labs    11/17/17 1035 03/23/18 1144 03/29/18 2043 03/30/18 0438  NA 139 137 136 138  K 4.5 3.8 3.9 4.3  CL 103 104 103 104  CO2 _0 GLUCOSE 92 99 124* 165*  BUN _1 CREATININE 0.87 0.74 0.70 0.70  CALCIUM 9.6 9.0 8.8* 8.8*  GFRNONAA  --  >60 >60 >60  GFRAA  --  >60 >60 >60  PROT 6.7 6.8  --   --   ALBUMIN 4.4 4.3  --   --   AST 15 19  --   --   ALT 16 24  --   --   ALKPHOS 34* 34*  --   --   BILITOT 0.5 0.6  --   --     RADIOGRAPHIC STUDIES: I  have personally reviewed the radiological images as listed and agreed with the findings in the report. Dg Chest 2 View  Result Date: 04/09/2018 CLINICAL DATA:  Previous adenocarcinoma lung with recent resection EXAM: CHEST - 2 VIEW  COMPARISON:  April 02, 2018. FINDINGS: Previously noted tiny pneumothorax on the right is no longer evident. There is scarring and postoperative change in the right mid lung. Lungs elsewhere are clear. No mass or adenopathy evident. Heart size and pulmonary vascular normal. No bone lesions. IMPRESSION: Postoperative change with scarring right mid lung. Lungs elsewhere clear. No evident pneumothorax. Cardiac silhouette normal. No adenopathy. Electronically Signed   By: Lowella Grip III M.D.   On: 04/09/2018 13:09   Chest 2 View  Result Date: 03/23/2018 CLINICAL DATA:  Pre op for bronchoscopy and VATS. EXAM: CHEST - 2 VIEW COMPARISON:  CT 02/15/2018.  Radiographs 10/30/2015. FINDINGS: The heart size and mediastinal contours are stable. The lungs appear clear. The subpleural right lower lobe nodule is not visible radiographically. There is no pleural effusion or pneumothorax. No acute osseous findings are evident. IMPRESSION: Stable chest.  No acute process or pulmonary nodularity visible. Electronically Signed   By: Richardean Sale M.D.   On: 03/23/2018 17:13   Dg Chest Port 1 View  Result Date: 04/02/2018 CLINICAL DATA:  Status post chest tube removal. EXAM: PORTABLE CHEST 1 VIEW COMPARISON:  04/02/2018 at 0447 hours FINDINGS: The cardiomediastinal silhouette is unchanged with normal heart size. The right-sided chest tube has been removed, and there is a tiny right apical pneumothorax. Postoperative changes are noted from right-sided wedge resection. Subsegmental atelectasis in both lower lungs is similar to the prior study. No sizable pleural effusion is seen. IMPRESSION: Tiny right apical pneumothorax following chest tube removal. Unchanged bilateral subsegmental atelectasis. Electronically Signed   By: Logan Bores M.D.   On: 04/02/2018 13:21   Dg Chest Port 1 View  Result Date: 04/02/2018 CLINICAL DATA:  Chest tube in place. EXAM: PORTABLE CHEST 1 VIEW COMPARISON:  Radiograph of April 01, 2018.  FINDINGS: The heart size and mediastinal contours are within normal limits. Right-sided chest tube is unchanged in position. No pneumothorax or significant pleural effusion is noted. Stable bibasilar subsegmental atelectasis is noted. The visualized skeletal structures are unremarkable. IMPRESSION: Stable right-sided chest tube without pneumothorax. Stable bibasilar subsegmental atelectasis. Electronically Signed   By: Marijo Conception, M.D.   On: 04/02/2018 07:16   Dg Chest Port 1 View  Result Date: 04/01/2018 CLINICAL DATA:  Postop check.  Post right thoracotomy. EXAM: PORTABLE CHEST 1 VIEW COMPARISON:  Radiograph 03/29/2018. FINDINGS: Right chest tube in place with tip at the apex. No visualized pneumothorax. Decreased subcutaneous emphysema the right chest wall. Chain sutures in the right lower lung zone with adjacent ill-defined opacity, likely atelectasis. No large pleural effusion. Mild streaky atelectasis at the left lung base. Normal heart size and mediastinal contours. No pulmonary edema. IMPRESSION: Right chest tube in place without pneumothorax. Streaky bibasilar opacities favor atelectasis. Electronically Signed   By: Keith Rake M.D.   On: 04/01/2018 04:03   Dg Chest Port 1 View  Result Date: 03/29/2018 CLINICAL DATA:  Post op chest tube placement EXAM: PORTABLE CHEST 1 VIEW COMPARISON:  03/23/2018 FINDINGS: Interval placement of RIGHT-sided chest tube. Surgical clips are identified along the LATERAL aspect of the RIGHT lung. There is small amount of subcutaneous gas. No definite pneumothorax. In the LEFT lung is clear. Heart size is normal. IMPRESSION: Interval placement of chest tube.  Postoperative changes.  No evidence for pneumothorax. Electronically Signed   By: Nolon Nations M.D.   On: 03/29/2018 19:02    ASSESSMENT & PLAN:   Primary cancer of right lower lobe of lung ([place]) # Stage I adenocarcinoma the lung/lipidic pattern.  Wedge resection clear margins; no role for any  adjuvant chemotherapy or radiation therapy.  #Recommend surveillance scans; given the stage I tumor recommend a scan in 1 year.  Pathology imaging reviewed the tumor conference.  # follow up to be decided.   Thank you Dr. Genevive Bi for allowing me to participate in the care of your pleasant patient. Please do not hesitate to contact me with questions or concerns in the interim.  Addendum: Discussed at the tumor conference; plan above.  Plan follow-up in 1 year CT scan prior.  Cc; Dr.Taalia.    All questions were answered. The patient knows to call the clinic with any problems, questions or concerns.   Cammie Sickle, MD 04/09/2018 10:54 PM

## 2018-04-08 ENCOUNTER — Telehealth: Payer: Self-pay | Admitting: Internal Medicine

## 2018-04-08 ENCOUNTER — Telehealth: Payer: Self-pay | Admitting: Licensed Clinical Social Worker

## 2018-04-08 DIAGNOSIS — C3431 Malignant neoplasm of lower lobe, right bronchus or lung: Secondary | ICD-10-CM

## 2018-04-08 NOTE — Telephone Encounter (Signed)
CSW contacted patient as a follow up to an EMMI call. CSW spent time talking with patient regarding her recent diagnosis of cancer and how she is emotionally coping. Patient states that she is much happier now that her pain is starting to subside. She continues to have her good days and her down days but states she has a supportive husband and family to help her get through the down days. She is goal oriented and once she heals more, she wants to begin going to the gym. CSW provided supportive listening. Patient expressed appreciation for CSW call. Shela Leff MSW,LCSW [phone]

## 2018-04-08 NOTE — Telephone Encounter (Signed)
Haley-please inform the patient that imaging pathology reviewed at the tumor conference.  No further therapy recommended.  Follow-up in 1 year CT scan chest prior/no labs.  CT is ordered.

## 2018-04-09 ENCOUNTER — Ambulatory Visit
Admission: RE | Admit: 2018-04-09 | Discharge: 2018-04-09 | Disposition: A | Discharge status: Home / Self Care | Payer: BLUE CROSS/BLUE SHIELD | Source: Ambulatory Visit | Attending: Cardiothoracic Surgery | Admitting: Cardiothoracic Surgery

## 2018-04-09 ENCOUNTER — Ambulatory Visit (INDEPENDENT_AMBULATORY_CARE_PROVIDER_SITE_OTHER): Payer: BLUE CROSS/BLUE SHIELD | Admitting: Cardiothoracic Surgery

## 2018-04-09 ENCOUNTER — Encounter: Payer: Self-pay | Admitting: Cardiothoracic Surgery

## 2018-04-09 VITALS — BP 144/105 | HR 97 | Temp 97.3°F | Ht 66.0 in | Wt 170.0 lb

## 2018-04-09 DIAGNOSIS — R911 Solitary pulmonary nodule: Secondary | ICD-10-CM | POA: Insufficient documentation

## 2018-04-09 DIAGNOSIS — R079 Chest pain, unspecified: Secondary | ICD-10-CM | POA: Diagnosis not present

## 2018-04-09 DIAGNOSIS — Z9889 Other specified postprocedural states: Secondary | ICD-10-CM | POA: Diagnosis not present

## 2018-04-09 DIAGNOSIS — J984 Other disorders of lung: Secondary | ICD-10-CM | POA: Insufficient documentation

## 2018-04-09 MED ORDER — OXYCODONE HCL 5 MG PO TABS: 5.0000 mg | ORAL_TABLET | Freq: Four times a day (QID) | ORAL | 0 refills | Status: DC | PRN

## 2018-04-09 NOTE — Telephone Encounter (Signed)
Pt made aware of conf recommendations. Aware of scheduled appts.

## 2018-04-09 NOTE — Patient Instructions (Addendum)
Please have your chest xray prior to seeing Dr.Oaks.  Please see your follow up appointment listed below.

## 2018-04-09 NOTE — Progress Notes (Signed)
She returns today in follow-up.  She has no specific problems.  Her pain is under good control.  She did see Dr. Lynett Fish who is recommended a repeat CT scan of the chest in 1 year.  She is desirous of going back to work.  She thinks that she is able to do so.  She does not complain of any shortness of breath.  On physical exam her incisions are all well-healed.  We did remove her single chest tube stitch and Steri-Stripped the wound.  Her lungs are clear bilaterally.  Her heart is regular.  She did have a chest x-ray today which have independently reviewed.  I see no evidence of pneumothorax or pleural effusion.  I did tell her that she could return to work in 1 week.  She will not lift anything more than 10 pounds and not drive a car for the first month.  She will come back to see me in 4 weeks with a repeat chest x-ray.

## 2018-04-12 ENCOUNTER — Other Ambulatory Visit: Payer: Self-pay

## 2018-04-12 MED ORDER — METHOCARBAMOL 500 MG PO TABS: 500.0000 mg | ORAL_TABLET | Freq: Four times a day (QID) | ORAL | 0 refills | Status: DC

## 2018-04-21 ENCOUNTER — Telehealth: Payer: Self-pay | Admitting: *Deleted

## 2018-04-21 NOTE — Telephone Encounter (Signed)
Patient called and stated that since she has started back work from having surgery on 03/29/18 she has been getting really sore throughout the day. She was wanting to know if she could get another refill on Percocet. She has not been taking one in the morning, she has been taking one when she gets home from work and one at night and that seems to be helping the soreness.

## 2018-04-21 NOTE — Telephone Encounter (Signed)
Called Dr. Genevive Bi and asked if he could write a refill on her Oxycodone. Dr. Genevive Bi stated that he would. I told him that I would go and pick it up at his [place] office if he could leave it there since he was going to assist Dr. Rosana Hoes on a surgery. He agreed.  I then called patient to let her know that Dr. Genevive Bi was going to write her a refill on her Oxycodone and that I would be back from lunch after 1:00 PM. Patient agreed and had no further questions. Prescription will be left at the front desk once I bring it to the office.

## 2018-04-25 ENCOUNTER — Other Ambulatory Visit: Payer: Self-pay | Admitting: Family Medicine

## 2018-04-26 MED ORDER — LISINOPRIL 10 MG PO TABS: 10.0000 mg | ORAL_TABLET | Freq: Every day | ORAL | 1 refills | Status: DC

## 2018-05-10 ENCOUNTER — Ambulatory Visit (INDEPENDENT_AMBULATORY_CARE_PROVIDER_SITE_OTHER): Payer: BLUE CROSS/BLUE SHIELD | Admitting: Cardiothoracic Surgery

## 2018-05-10 ENCOUNTER — Ambulatory Visit
Admission: RE | Admit: 2018-05-10 | Discharge: 2018-05-10 | Disposition: A | Discharge status: Home / Self Care | Payer: BLUE CROSS/BLUE SHIELD | Source: Ambulatory Visit | Attending: Cardiothoracic Surgery | Admitting: Cardiothoracic Surgery

## 2018-05-10 ENCOUNTER — Encounter: Payer: Self-pay | Admitting: Cardiothoracic Surgery

## 2018-05-10 ENCOUNTER — Other Ambulatory Visit: Payer: Self-pay

## 2018-05-10 VITALS — BP 127/86 | HR 84 | Temp 97.5°F | Wt 165.0 lb

## 2018-05-10 DIAGNOSIS — R911 Solitary pulmonary nodule: Secondary | ICD-10-CM

## 2018-05-10 DIAGNOSIS — R918 Other nonspecific abnormal finding of lung field: Secondary | ICD-10-CM | POA: Diagnosis not present

## 2018-05-10 DIAGNOSIS — C3490 Malignant neoplasm of unspecified part of unspecified bronchus or lung: Secondary | ICD-10-CM | POA: Insufficient documentation

## 2018-05-10 NOTE — Patient Instructions (Signed)
Patient is to return in 3 months with chest xray. Call the office with any questions or concerns.

## 2018-05-10 NOTE — Progress Notes (Signed)
She returns today in follow-up.  She is now about 5 to 6 weeks out from her right thoracotomy and lung resection.  She states that she has occasional discomfort but this has been only a minor complaint of hers.  She is able to go back to work now almost full-time.  She has no specific complaints today.  She did see Dr. Lynett Fish who is recommended a CT scan in 1 year.  On physical exam her thoracotomy wound is healing as expected.  Her lungs are diminished bilaterally but are equal.  Her heart is regular.  I did get a chest x-ray today which have independently reviewed.  I see no complicating features.  I will see her back again in 3 months time with another chest x-ray.  She will follow-up with Dr. Lynett Fish in 1 year with a CT of the chest.

## 2018-05-31 ENCOUNTER — Encounter: Payer: Self-pay | Admitting: Family Medicine

## 2018-05-31 ENCOUNTER — Ambulatory Visit (INDEPENDENT_AMBULATORY_CARE_PROVIDER_SITE_OTHER): Payer: BLUE CROSS/BLUE SHIELD

## 2018-05-31 ENCOUNTER — Ambulatory Visit: Payer: BLUE CROSS/BLUE SHIELD | Admitting: Family Medicine

## 2018-05-31 VITALS — BP 122/94 | HR 101 | Temp 98.6°F | Ht 66.0 in | Wt 165.0 lb

## 2018-05-31 DIAGNOSIS — M65311 Trigger thumb, right thumb: Secondary | ICD-10-CM

## 2018-05-31 NOTE — Assessment & Plan Note (Signed)
Symptoms suggestive of trigger thumb. -Injection today. -Counseled on splint use. -If no improvement may need to consider surgery.

## 2018-05-31 NOTE — Patient Instructions (Signed)
Good to see you  Please wear the thumb spica splint for the 3 weeks. Wear it when you'll be most active. You may want to wear it at night if you wake up with pain  Please see me back in 3-4 weeks if no better.

## 2018-05-31 NOTE — Progress Notes (Signed)
Catherine Gilbert - 42 y.o. female MRN [id number]  Date of birth: [birth year]  SUBJECTIVE:  Including CC & ROS.  Chief Complaint  Patient presents with  . Hand Pain    right thumb pain    Catherine Gilbert is a 42 y.o. female that is presenting with right thumb pain.  She is having pain on the volar aspect of her thumb with associated triggering.  The pain seems to be worse in the morning.  The pain is throbbing in nature.  She has a history of similar pain.  She received an injection a few months ago and that helped immensely.  She has not been using anything for the pain.  She denies any inciting event.  Pain is intermittent.  Pain is moderate to severe.  Pain is localized to the thumb.    Review of Systems  Constitutional: Negative for fever.  HENT: Negative for congestion.   Respiratory: Negative for cough.   Cardiovascular: Negative for chest pain.  Gastrointestinal: Negative for abdominal pain.  Musculoskeletal: Negative for back pain.  Skin: Negative for color change.  Neurological: Negative for weakness.  Hematological: Negative for adenopathy.  Psychiatric/Behavioral: Negative for agitation.    HISTORY: Past Medical, Surgical, Social, and Family History Reviewed & Updated per EMR.   Pertinent Historical Findings include:  Past Medical History:  Diagnosis Date  . Abnormal Pap smear   . Anemia   . Anxiety   . Anxiety state 04/23/2011  . Depression   . Dysmenorrhea 08/27/2011  . Dyspnea on exertion 10/30/2015  . Essential hypertension 09/15/2012  . Family history of ASCVD (arteriosclerotic cardiovascular disease) 12/31/2016  . Family history of premature CAD 11/11/2016  . GAD (generalized anxiety disorder) 10/30/2015  . GERD (gastroesophageal reflux disease) 05/29/2014  . Headache 01/01/2017  . HSV (herpes simplex virus) infection   . Hypertension   . Initiation of Depo Provera 09/24/2011  . Lung nodule    RLL on coronary calcium score CT  . Migraine headache     Past Surgical  History:  Procedure Laterality Date  . CESAREAN SECTION     x 3   . COLPOSCOPY  2010  . DILATION AND CURETTAGE OF UTERUS    . FLEXIBLE BRONCHOSCOPY N/A 03/29/2018   Procedure: PREOP  BRONCHOSCOPY;  Surgeon: Nestor Lewandowsky, MD;  Location: [facility];  Service: Thoracic;  Laterality: N/A;  . THORACOTOMY Right 03/29/2018   Procedure: THORACOTOMY MAJOR- POSSIBLE LOBECTOMY;  Surgeon: Nestor Lewandowsky, MD;  Location: [facility];  Service: Thoracic;  Laterality: Right;  . TUBAL LIGATION    . VIDEO ASSISTED THORACOSCOPY (VATS)/WEDGE RESECTION Right 03/29/2018   Procedure: VIDEO ASSISTED THORACOSCOPY (VATS)/WEDGE RESECTION;  Surgeon: Nestor Lewandowsky, MD;  Location: [facility];  Service: Thoracic;  Laterality: Right;    No Known Allergies  Family History  Problem Relation Age of Onset  . Stroke Mother 63       massive stroke  . Heart disease Father 78       congestive heart failure  . Hypertension Father   . Heart disease Brother 44       Suspected MI and SCD  . Heart disease Sister   . Stroke Sister   . Breast cancer Maternal Grandmother 73  . Breast cancer Cousin        x2 paternal     Social History   Socioeconomic History  . Marital status: Married    Spouse name: Not on file  . Number of children: Not on file  .  Years of education: Not on file  . Highest education level: Not on file  Occupational History  . Not on file  Social Needs  . Financial resource strain: Not on file  . Food insecurity:    Worry: Not on file    Inability: Not on file  . Transportation needs:    Medical: Not on file    Non-medical: Not on file  Tobacco Use  . Smoking status: Former Smoker    Packs/day: 1.00    Years: 25.00    Pack years: 25.00    Types: Cigarettes    Last attempt to quit: 11/22/2015    Years since quitting: 2.5  . Smokeless tobacco: Never Used  Substance and Sexual Activity  . Alcohol use: Yes    Comment: OCC  . Drug use: No  . Sexual activity: Yes    Birth control/protection:  Surgical    Comment: tubalization  Lifestyle  . Physical activity:    Days per week: Not on file    Minutes per session: Not on file  . Stress: Not on file  Relationships  . Social connections:    Talks on phone: Not on file    Gets together: Not on file    Attends religious service: Not on file    Active member of club or organization: Not on file    Attends meetings of clubs or organizations: Not on file    Relationship status: Not on file  . Intimate partner violence:    Fear of current or ex partner: Not on file    Emotionally abused: Not on file    Physically abused: Not on file    Forced sexual activity: Not on file  Other Topics Concern  . Not on file  Social History Narrative  . Not on file     PHYSICAL EXAM:  VS: BP (!) 122/94 (BP Location: Left Arm, Patient Position: Sitting, Cuff Size: Normal)   Pulse (!) 101   Temp 98.6 F (37 C) (Oral)   Ht _0  (1.676 m)   Wt 165 lb (74.8 kg)   SpO2 97%   BMI 26.63 kg/m  Physical Exam Gen: NAD, alert, cooperative with exam, well-appearing ENT: normal lips, normal nasal mucosa,  Eye: normal EOM, normal conjunctiva and lids CV:  no edema, +2 pedal pulses   Resp: no accessory muscle use, non-labored,  Skin: no rashes, no areas of induration  Neuro: normal tone, normal sensation to touch Psych:  normal insight, alert and oriented MSK:  Right thumb: Tenderness to palpation over the volar MP joint. Mild triggering present with flexion. Normal range of motion. Neurovascular intact    Aspiration/Injection Procedure Note Catherine Gilbert 12-24-1975  Procedure: Injection Indications: Right trigger thumb  Procedure Details Consent: Risks of procedure as well as the alternatives and risks of each were explained to the (patient/caregiver).  Consent for procedure obtained. Time Out: Verified patient identification, verified procedure, site/side was marked, verified correct patient position, special equipment/implants  available, medications/allergies/relevent history reviewed, required imaging and test results available.  Performed.  The area was cleaned with iodine and alcohol swabs.    The right trigger thumb was injected using 0.5 cc's of 40 mg Kenalog and 0.5 cc's of 0.25% bupivacaine with a 25 1 1/2" needle.  Ultrasound was used. Images were obtained in Long views showing the injection.    A sterile dressing was applied.  Patient did tolerate procedure well.      ASSESSMENT & PLAN:  Trigger thumb Symptoms suggestive of trigger thumb. -Injection today. -Counseled on splint use. -If no improvement may need to consider surgery.

## 2018-06-28 ENCOUNTER — Other Ambulatory Visit: Payer: Self-pay

## 2018-06-28 DIAGNOSIS — R911 Solitary pulmonary nodule: Secondary | ICD-10-CM

## 2018-07-07 ENCOUNTER — Encounter: Payer: Self-pay | Admitting: Family Medicine

## 2018-07-07 IMAGING — CT CT NECK W/ CM
5 of 6 series · 14 of 33 positions shown, 16 images · IV contrast (iopamidol)
Comparison: None.

CLINICAL DATA: "Neck mass".

Tech history of right-sided intermittent neck swelling with right
ear pain and fullness for 1 month
EXAM:
CT NECK WITH CONTRAST
TECHNIQUE: Multidetector CT imaging of the neck was performed using the
standard protocol following the bolus administration of intravenous
contrast.
CONTRAST:  75mL [id number] IOPAMIDOL ([id number]) INJECTION 61%

[Series 2: axial neck neck (person_name) · axial · 0.67mm/px · z∈[-741,-661]mm · 2 of 122 slices shown]
[im 41/122  bone]
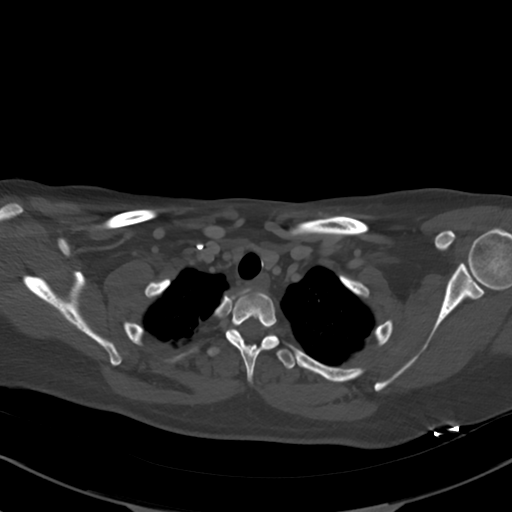
[im 81/122  bone]
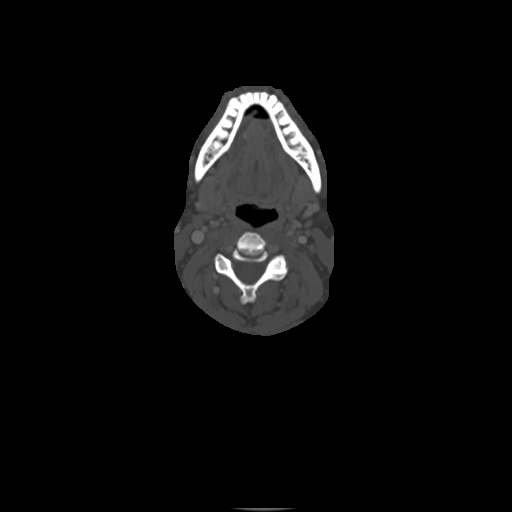

[Series 3: axial bone neck · axial · 0.67mm/px · z∈[-741,-661]mm · 2 of 122 slices shown]
[im 41/122  bone]
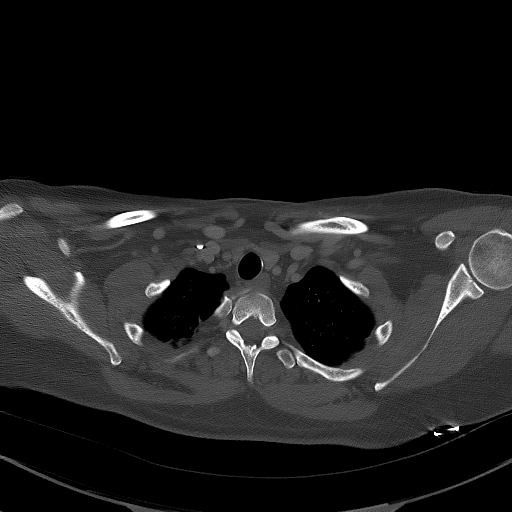
[im 81/122  bone]
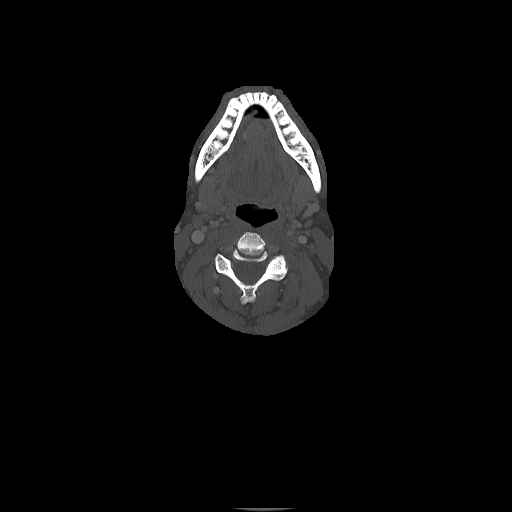

[Series 5: coronal neck neck (person_name) · coronal · 0.55mm/px · 3 of 170 slices shown]
[im 34/170  bone]
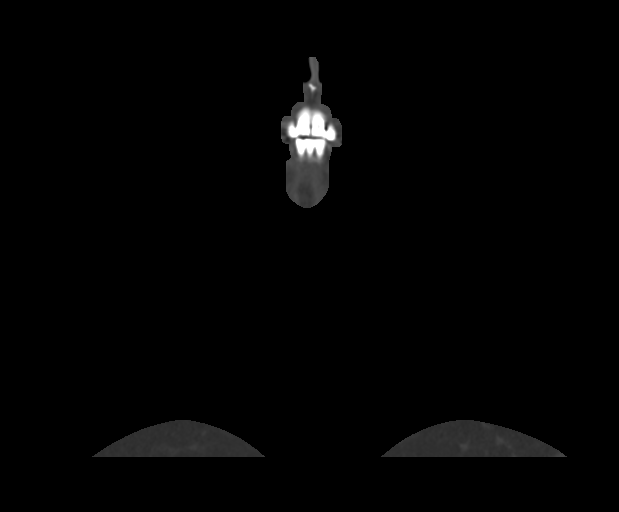
[im 68/170  bone]
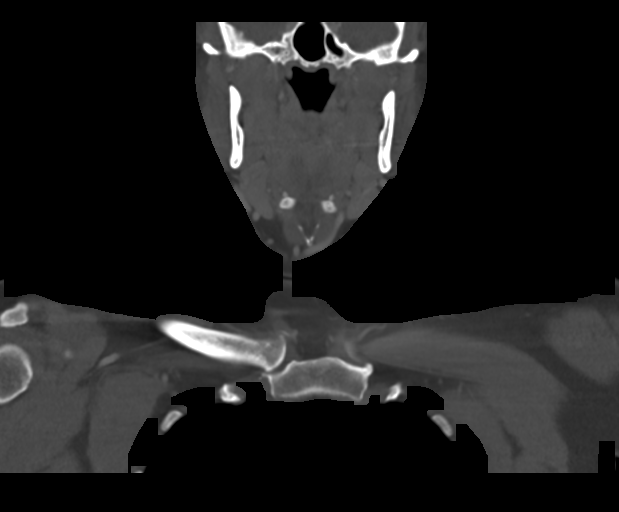
[im 102/170  bone]
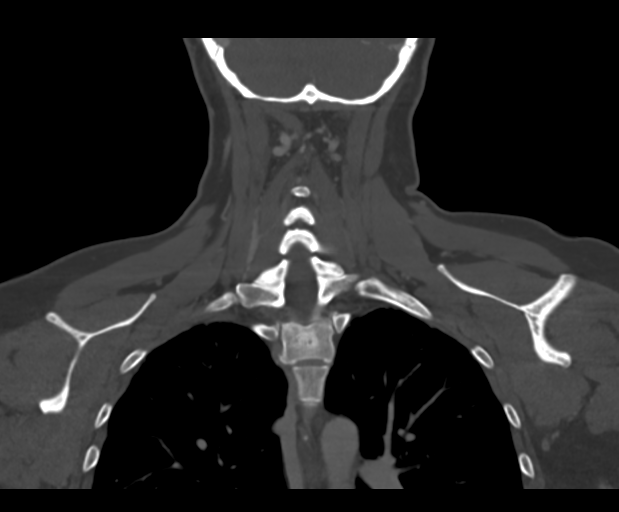

[Series 7: sagittal neck neck (person_name) · sagittal · 0.55mm/px · 5 of 170 slices shown, 6 images]
[im 57/170  bone]
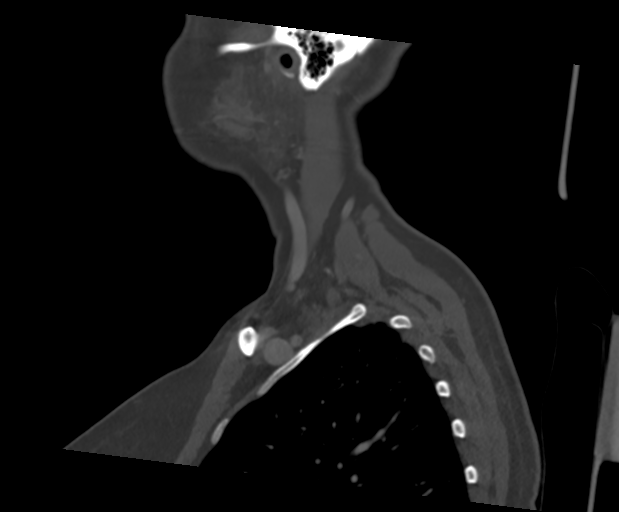
[im 71/170  bone]
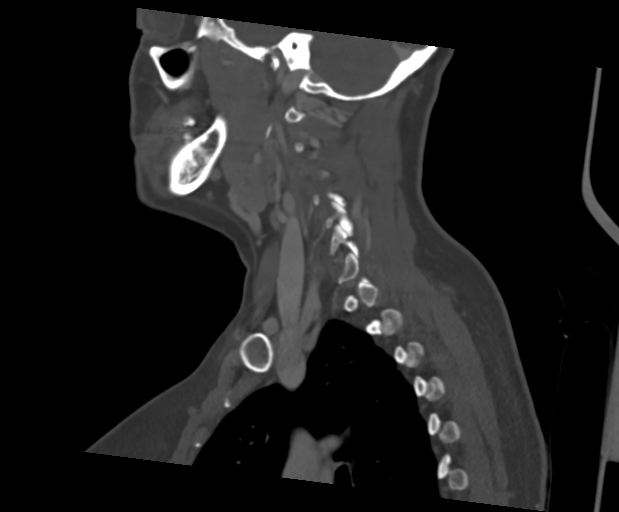
[im 85/170  soft-tissue]
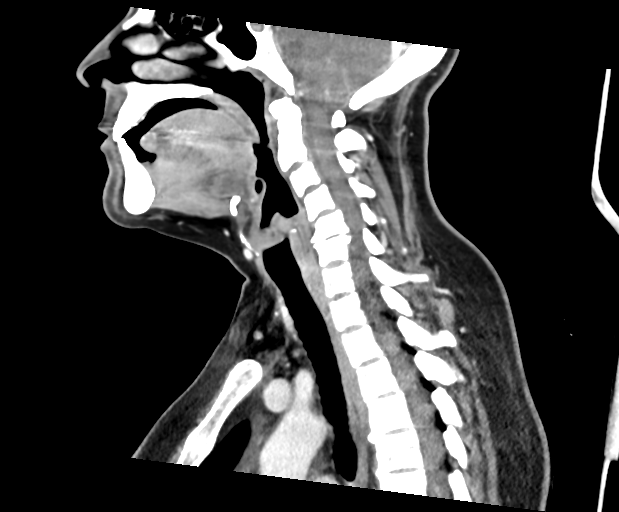
[im 85/170  bone]
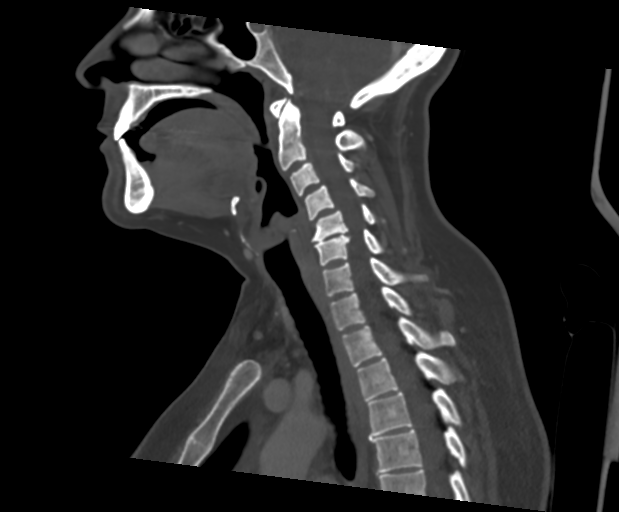
[im 99/170  bone]
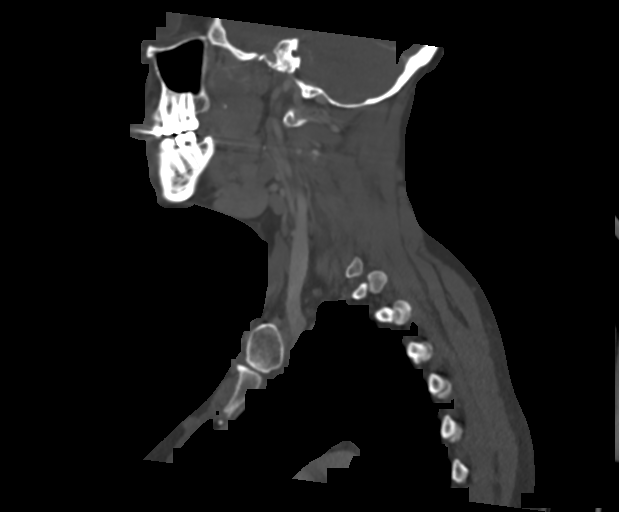
[im 113/170  bone]
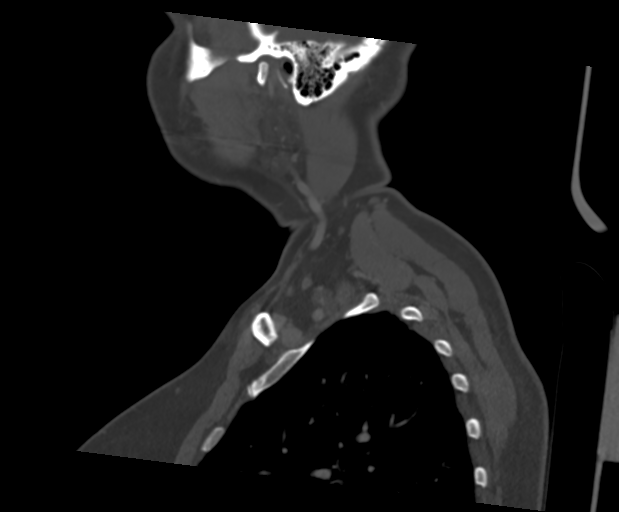

[Series 9: ax oropharynx neck neck (person_name) · axial · 0.67mm/px · z∈[-770,-677]mm · 2 of 141 slices shown, 3 images]
[im 47/141  soft-tissue]
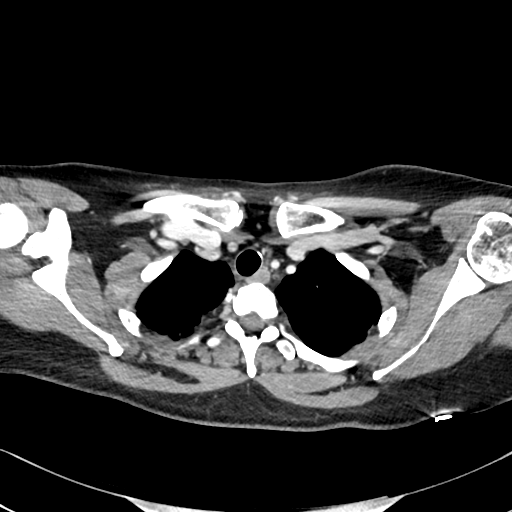
[im 47/141  bone]
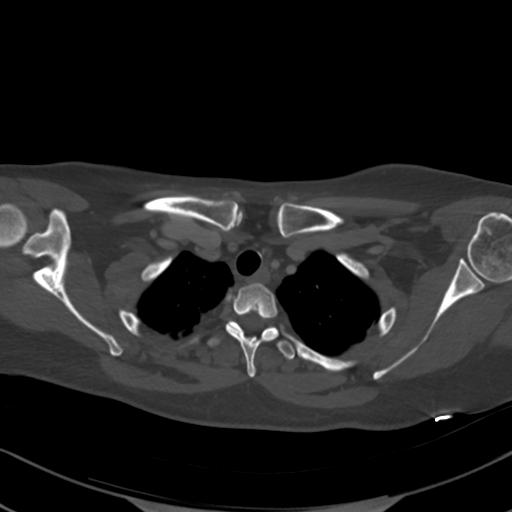
[im 94/141  bone]
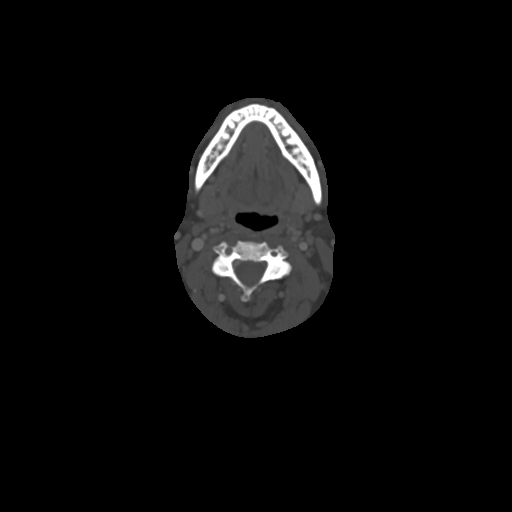

[14 of 33 positions shown; findings below may reference images not displayed]

FINDINGS: Pharynx and larynx: Normal. No mass or swelling.

Salivary glands: No inflammation, mass, or stone.

Thyroid: Normal.

Lymph nodes: None enlarged or abnormal density.

Vascular: Negative.

Limited intracranial: Cerebellar tonsils appear low on axial slices,
but no indication of Chiari malformation on reformats.

Visualized orbits: Negative

Mastoids and visualized paranasal sinuses: Clear. No mastoid or
middle ear opacification.

Skeleton: Focal C5-6 disc degeneration with narrowing and
uncovertebral spurring causing notable left foraminal impingement.

Asymmetric left facet arthropathy with C2-3, C3-4, and C4-5
spurring.

Upper chest: Negative
IMPRESSION: 1. No explanation for history of neck mass. If site of concern is
noted specific comment can be made.
2. C5-6 disc degeneration with left more than right foraminal
impingement.

## 2018-07-13 ENCOUNTER — Encounter: Payer: Self-pay | Admitting: Family Medicine

## 2018-07-31 ENCOUNTER — Other Ambulatory Visit: Payer: Self-pay | Admitting: Family Medicine

## 2018-07-31 ENCOUNTER — Encounter: Payer: Self-pay | Admitting: Family Medicine

## 2018-08-02 ENCOUNTER — Other Ambulatory Visit: Payer: Self-pay

## 2018-08-02 DIAGNOSIS — K219 Gastro-esophageal reflux disease without esophagitis: Secondary | ICD-10-CM

## 2018-08-02 MED ORDER — OMEPRAZOLE 40 MG PO CPDR: 40.0000 mg | DELAYED_RELEASE_CAPSULE | ORAL | 0 refills | Status: DC

## 2018-08-19 ENCOUNTER — Other Ambulatory Visit: Payer: Self-pay

## 2018-08-19 DIAGNOSIS — R911 Solitary pulmonary nodule: Secondary | ICD-10-CM

## 2018-08-20 ENCOUNTER — Encounter: Payer: BLUE CROSS/BLUE SHIELD | Admitting: Cardiothoracic Surgery

## 2018-08-23 ENCOUNTER — Ambulatory Visit
Admission: RE | Admit: 2018-08-23 | Discharge: 2018-08-23 | Disposition: A | Discharge status: Home / Self Care | Payer: BLUE CROSS/BLUE SHIELD | Source: Ambulatory Visit | Attending: Cardiothoracic Surgery | Admitting: Cardiothoracic Surgery

## 2018-08-23 ENCOUNTER — Encounter: Payer: Self-pay | Admitting: Cardiothoracic Surgery

## 2018-08-23 ENCOUNTER — Other Ambulatory Visit: Payer: Self-pay

## 2018-08-23 ENCOUNTER — Ambulatory Visit
Admission: RE | Admit: 2018-08-23 | Discharge: 2018-08-23 | Disposition: A | Discharge status: Home / Self Care | Payer: BLUE CROSS/BLUE SHIELD | Attending: Cardiothoracic Surgery | Admitting: Cardiothoracic Surgery

## 2018-08-23 ENCOUNTER — Ambulatory Visit (INDEPENDENT_AMBULATORY_CARE_PROVIDER_SITE_OTHER): Payer: BLUE CROSS/BLUE SHIELD | Admitting: Cardiothoracic Surgery

## 2018-08-23 VITALS — BP 140/82 | HR 89 | Temp 97.0°F | Resp 16 | Ht 66.0 in | Wt 170.2 lb

## 2018-08-23 DIAGNOSIS — R911 Solitary pulmonary nodule: Secondary | ICD-10-CM | POA: Diagnosis not present

## 2018-08-23 DIAGNOSIS — Z87898 Personal history of other specified conditions: Secondary | ICD-10-CM | POA: Diagnosis not present

## 2018-08-23 DIAGNOSIS — R079 Chest pain, unspecified: Secondary | ICD-10-CM | POA: Diagnosis not present

## 2018-08-23 NOTE — Progress Notes (Signed)
  Patient ID: Catherine Gilbert, female   DOB: 01-07-1976, 43 y.o.   MRN: [id number]  HISTORY: She returns today in follow-up.  She is now about 6 months out from her right thoracotomy and wedge resection.  Overall she states she is doing quite well.  She did start yoga but quit because she was experiencing some vague discomfort on her right side and did not want to injure anything.  She does not have any shortness of breath.  She does not smoke.  She quit 3 years ago.   Vitals:   08/23/18 0852  BP: 140/82  Pulse: 89  Resp: 16  Temp: (!) 97 F (36.1 C)  SpO2: 97%     EXAM:    Resp: Lungs are clear bilaterally.  No respiratory distress, normal effort. Heart:  Regular without murmurs Abd:  Abdomen is soft, non distended and non tender. No masses are palpable.  There is no rebound and no guarding.  Neurological: Alert and oriented to person, place, and time. Coordination normal.  Skin: Skin is warm and dry. No rash noted. No diaphoretic. No erythema. No pallor. Her thoracotomy wound is healing as expected. Psychiatric: Normal mood and affect. Normal behavior. Judgment and thought content normal.   She did have a chest x-ray which I have independently reviewed.  I see no evidence of recurrence on it.  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   PLAN:   She will follow-up with Dr. Lynett Fish in 6 months with a CT scan.  I did not make any further follow-up with her but would be happy to see her should the need arise.  I did tell her that she could resume yoga if she wanted to.    Nestor Lewandowsky, MD

## 2018-08-23 NOTE — Patient Instructions (Signed)
May exercise as able.  Follow up as needed.

## 2018-09-04 ENCOUNTER — Other Ambulatory Visit: Payer: Self-pay | Admitting: Family Medicine

## 2018-09-04 DIAGNOSIS — K219 Gastro-esophageal reflux disease without esophagitis: Secondary | ICD-10-CM

## 2018-09-07 DIAGNOSIS — F4322 Adjustment disorder with anxiety: Secondary | ICD-10-CM | POA: Diagnosis not present

## 2018-09-15 DIAGNOSIS — F4322 Adjustment disorder with anxiety: Secondary | ICD-10-CM | POA: Diagnosis not present

## 2018-09-19 ENCOUNTER — Telehealth: Payer: Self-pay | Admitting: Family Medicine

## 2018-09-19 NOTE — Telephone Encounter (Signed)
Called patient as she had a recent lobectomy and should not be coming into the office to simply discuss medications.  If at all possible, we should have a televisit scheduled for her.  Once I spoke to her, she said she has a physical already scheduled for May and has all of her refills completed.  She does not feel she needs an OV or televisit on Tuesday, 3/22.  I will ask staff to cancel her appointment tomorrow.  She already has a CPX scheduled for 11/23/18.

## 2018-09-21 ENCOUNTER — Ambulatory Visit: Payer: BLUE CROSS/BLUE SHIELD | Admitting: Family Medicine

## 2018-09-28 ENCOUNTER — Encounter: Payer: Self-pay | Admitting: Family Medicine

## 2018-09-28 NOTE — Progress Notes (Signed)
Virtual Visit via Video   I connected with Catherine Gilbert on 09/28/18 at  9:00 AM EDT by a video enabled telemedicine application and verified that I am speaking with the correct person using two identifiers. Location patient: Home Location provider: [facility], Office Persons participating in the virtual visit: Haze Rushing, MD   I discussed the limitations of evaluation and management by telemedicine and the availability of in person appointments. The patient expressed understanding and agreed to proceed.  Subjective:   HPI:  Anxiety-  Taking zoloft 150 mg daily with as needed xanax.    Has not been staying asleep. She cannot stay asleep and is tossing and turning throughout the night. She was doing fine with her anxiety and depression until this virus happened.  She is fearful of the virus and that it may effect her lungs. She is now about 7 months out from her right thoracotomy and wedge resection due to primary cancer of right lower lobe of lung.  No SI or HI.  She feels if she didn't have this lung surgery, she would not be anxious.  She cannot work from home unfortunately.    She sometimes has to take a 1/2 of an Alprazolam during the day at work as well as her normal 1qhs.       PHQ & GAD completed. Rx's to prepared to send to [facility]. Depression screen [facility] 2/9 09/29/2018 01/01/2017 10/30/2015  Decreased Interest 2 0 1  Down, Depressed, Hopeless 0 0 0  PHQ - 2 Score 2 0 1  Altered sleeping 2 - -  Tired, decreased energy 3 - -  Change in appetite 1 - -  Feeling bad or failure about yourself  0 - -  Trouble concentrating 2 - -  Moving slowly or fidgety/restless 0 - -  Suicidal thoughts 0 - -  PHQ-9 Score 10 - -  Difficult doing work/chores Somewhat difficult - -    GAD 7 : Generalized Anxiety Score 09/29/2018  Nervous, Anxious, on Edge 3  Control/stop worrying 2  Worry too much - different things 3  Trouble relaxing 2  Restless 0  Easily annoyed or  irritable 2  Afraid - awful might happen 2  Total GAD 7 Score 14  Anxiety Difficulty Very difficult    HTN- takes lisinopril 10 mg daily. She feels her BP has been well controlled. She will check it for me and send me readings.  Denies HA, blurred vision, CP or SOB.  No LE edema.  Lab Results  Component Value Date   CREATININE 0.70 03/30/2018     ROS: See pertinent positives and negatives per HPI.  Review of Systems  Constitutional: Negative.   HENT: Negative.   Respiratory: Negative.   Cardiovascular: Negative.   Gastrointestinal: Negative.   Musculoskeletal: Negative.   Psychiatric/Behavioral: Positive for dysphoric mood and sleep disturbance. Negative for agitation, behavioral problems, confusion, decreased concentration, hallucinations, self-injury and suicidal ideas. The patient is not hyperactive.   All other systems reviewed and are negative.    Patient Active Problem List   Diagnosis Date Noted  . Primary cancer of right lower lobe of lung ([place]) 04/07/2018  . Lung mass 03/29/2018  . Trigger thumb 12/28/2017  . Neck mass 11/17/2017  . Lung nodule, solitary 02/18/2017  . Headache 01/01/2017  . Family history of ASCVD (arteriosclerotic cardiovascular disease) 12/31/2016  . Family history of premature CAD 11/11/2016  . GAD (generalized anxiety disorder) 10/30/2015  . Dyspnea on  exertion 10/30/2015  . Well woman exam with routine gynecological exam 10/23/2014  . GERD (gastroesophageal reflux disease) 05/29/2014  . Essential hypertension 09/15/2012  . Stricture and stenosis of cervix 09/24/2011  . Dysmenorrhea 08/27/2011  . Anxiety state 04/23/2011    Social History   Tobacco Use  . Smoking status: Former Smoker    Packs/day: 1.00    Years: 25.00    Pack years: 25.00    Types: Cigarettes    Last attempt to quit: 11/22/2015    Years since quitting: 2.8  . Smokeless tobacco: Never Used  Substance Use Topics  . Alcohol use: Yes    Comment: OCC     Current Outpatient Medications:  .  ALPRAZolam (XANAX) 0.5 MG tablet, TAKE 1 TABLET BY MOUTH AT BEDTIME AS NEEDED (Patient taking differently: Take 0.5 mg by mouth at bedtime as needed for anxiety. ), Disp: 30 tablet, Rfl: 2 .  Aspirin-Caffeine (BC FAST PAIN RELIEF PO), Take 1 packet by mouth daily as needed (headache)., Disp: , Rfl:  .  ferrous sulfate (FEROSUL) 325 (65 FE) MG tablet, TAKE 1 TABLET(325 MG) BY MOUTH TWICE DAILY WITH A MEAL (Patient taking differently: Take 325 mg by mouth daily. ), Disp: 180 tablet, Rfl: 0 .  lisinopril (PRINIVIL,ZESTRIL) 10 MG tablet, TAKE 1 TABLET(10 MG) BY MOUTH DAILY, Disp: 90 tablet, Rfl: 1 .  Multiple Vitamin (MULTIVITAMIN WITH MINERALS) TABS tablet, Take 2 tablets by mouth daily. Juice Plus fruit and vegetables, Disp: , Rfl:  .  omeprazole (PRILOSEC) 40 MG capsule, TAKE 1 CAPSULE(40 MG) BY MOUTH EVERY MORNING, Disp: 30 capsule, Rfl: 2 .  Phenylephrine-APAP-guaiFENesin (TYLENOL SINUS SEVERE PO), Take 2 tablets by mouth daily as needed (sinus headaches)., Disp: , Rfl:  .  sertraline (ZOLOFT) 100 MG tablet, Take 1.5 tabs qd (Patient taking differently: Take 150 mg by mouth every morning. ), Disp: 135 tablet, Rfl: 3 .  valACYclovir (VALTREX) 500 MG tablet, TAKE 2 TABLETS BY MOUTH TODAY, THEN 2 TABLETS TWICE DAILY AS NEEDED., Disp: 60 tablet, Rfl: 0  No Known Allergies  Objective:  There were no vitals taken for this visit.  VITALS: Per patient if applicable, see vitals. GENERAL: Alert, appears well and in no acute distress. HEENT: Atraumatic, conjunctiva clear, no obvious abnormalities on inspection of external nose and ears. NECK: Normal movements of the head and neck. CARDIOPULMONARY: No increased WOB. Speaking in clear sentences. I:E ratio WNL.  MS: Moves all visible extremities without noticeable abnormality. PSYCH: Pleasant and cooperative, well-groomed. Speech normal rate and rhythm. Affect is appropriate. Insight and judgement are appropriate.  Attention is focused, linear, and appropriate.  NEURO: CN grossly intact. Oriented as arrived to appointment on time with no prompting. Moves both UE equally.  SKIN: No obvious lesions, wounds, erythema, or cyanosis noted on face or hands.  Assessment and Plan:   There are no diagnoses linked to this encounter.  . Reviewed expectations re: course of current medical issues. . Discussed self-management of symptoms. . Outlined signs and symptoms indicating need for more acute intervention. . Patient verbalized understanding and all questions were answered. Marland Kitchen Health Maintenance issues including appropriate healthy diet, exercise, and smoking avoidance were discussed with patient. . See orders for this visit as documented in the electronic medical record.  Arnette Norris, MD 09/28/2018

## 2018-09-29 ENCOUNTER — Other Ambulatory Visit: Payer: Self-pay

## 2018-09-29 ENCOUNTER — Ambulatory Visit (INDEPENDENT_AMBULATORY_CARE_PROVIDER_SITE_OTHER): Payer: BLUE CROSS/BLUE SHIELD | Admitting: Family Medicine

## 2018-09-29 DIAGNOSIS — C3431 Malignant neoplasm of lower lobe, right bronchus or lung: Secondary | ICD-10-CM

## 2018-09-29 DIAGNOSIS — F409 Phobic anxiety disorder, unspecified: Secondary | ICD-10-CM | POA: Insufficient documentation

## 2018-09-29 DIAGNOSIS — F329 Major depressive disorder, single episode, unspecified: Secondary | ICD-10-CM

## 2018-09-29 DIAGNOSIS — F32A Depression, unspecified: Secondary | ICD-10-CM

## 2018-09-29 DIAGNOSIS — I10 Essential (primary) hypertension: Secondary | ICD-10-CM

## 2018-09-29 DIAGNOSIS — K219 Gastro-esophageal reflux disease without esophagitis: Secondary | ICD-10-CM | POA: Diagnosis not present

## 2018-09-29 DIAGNOSIS — F5105 Insomnia due to other mental disorder: Secondary | ICD-10-CM | POA: Diagnosis not present

## 2018-09-29 DIAGNOSIS — F419 Anxiety disorder, unspecified: Secondary | ICD-10-CM | POA: Diagnosis not present

## 2018-09-29 MED ORDER — LISINOPRIL 10 MG PO TABS: ORAL_TABLET | ORAL | 1 refills | Status: DC

## 2018-09-29 MED ORDER — SERTRALINE HCL 100 MG PO TABS: ORAL_TABLET | ORAL | 1 refills | Status: DC

## 2018-09-29 MED ORDER — OMEPRAZOLE 40 MG PO CPDR: DELAYED_RELEASE_CAPSULE | ORAL | 3 refills | Status: DC

## 2018-09-29 MED ORDER — ALPRAZOLAM 0.5 MG PO TABS: 0.5000 mg | ORAL_TABLET | Freq: Every evening | ORAL | 5 refills | Status: DC | PRN

## 2018-09-29 MED ORDER — FERROUS SULFATE 325 (65 FE) MG PO TABS: ORAL_TABLET | ORAL | 3 refills | Status: DC

## 2018-09-29 NOTE — Assessment & Plan Note (Signed)
>  50 minutes spent in face to face time with patient, >50% spent talking about anxiety, depression, HTN and COVID 19 virus.  I talked with her boss who states her job really cannot be done remotely but she can file for FMLA.  At this time, she is not prepared to do that. He will place her in a separate room with gloves, lysol and other wipes.  She is usually in the office alone. Continue current Zoloft 150 mg daily and xanax for now- eRx refills sent. She will let me know if we need to increase dose on Monday of her zoloft.  She says she already feels more reassured after our discussion today. She will check her temp daily as well.

## 2018-10-02 ENCOUNTER — Encounter: Payer: Self-pay | Admitting: Family Medicine

## 2018-10-03 NOTE — Progress Notes (Signed)
Virtual Visit via Video   I connected with Catherine Gilbert on 10/04/18 at  8:20 AM EDT by a video enabled telemedicine application and verified that I am speaking with the correct person using two identifiers. Location patient: Home Location provider: [facility], Office Persons participating in the virtual visit: Haze Rushing, MD   I discussed the limitations of evaluation and management by telemedicine and the availability of in person appointments. The patient expressed understanding and agreed to proceed.  Subjective:   HPI: Follow up.  I last evaluated Catherine Gilbert via video enabled virtual visit on 09/29/18. Note reviewed.  At that time, she stated the following:  Anxiety-  She had been taking zoloft 150 mg daily with as needed xanax.    Had not been staying asleep. She was having difficulty staying asleep and is tossing and turning throughout the night. She was doing fine with her anxiety and depression until this virus happened.  She is fearful of the virus and that it may effect her lungs. She is now about 7 months out from her right thoracotomy and wedge resection due to primary cancer of right lower lobe of lung.  No SI or HI.  She feels if she didn't have this lung surgery, she would not be anxious.  She cannot work from home unfortunately.    She sometimes has to take a 1/2 of an Alprazolam during the day at work as well as her normal 1qhs.   She feels a little better.  Says episodes are better by 50% but does want to try to increase dose of zoloft.   Depression screen [facility] 2/9 10/04/2018 09/29/2018 01/01/2017 10/30/2015  Decreased Interest 3 2 0 1  Down, Depressed, Hopeless 1 0 0 0  PHQ - 2 Score 4 2 0 1  Altered sleeping 2 2 - -  Tired, decreased energy 3 3 - -  Change in appetite 2 1 - -  Feeling bad or failure about yourself  0 0 - -  Trouble concentrating 3 2 - -  Moving slowly or fidgety/restless 0 0 - -  Suicidal thoughts 0 0 - -  PHQ-9 Score  14 10 - -  Difficult doing work/chores Somewhat difficult Somewhat difficult - -      GAD 7 : Generalized Anxiety Score 10/04/2018 09/29/2018  Nervous, Anxious, on Edge 3 3  Control/stop worrying 3 2  Worry too much - different things 2 3  Trouble relaxing 0 2  Restless 0 0  Easily annoyed or irritable 2 2  Afraid - awful might happen 3 2  Total GAD 7 Score 13 14  Anxiety Difficulty Very difficult Very difficult      HTN- takes lisinopril 10 mg daily. She felt  her BP has been well controlled. She was advised to check it and give me those readings today.  Denies HA, blurred vision, CP or SOB.  No LE edema.  With Catherine Gilbert's permission, she and II talked with her boss who states her job really cannot be done remotely but she can file for FMLA.  At this time, she is not prepared to do that. He will place her in a separate room with gloves, lysol and other wipes.  She is usually in the office alone. Continue current Zoloft 150 mg daily and xanax for now- eRx refills sent. She will let me know if we need to increase dose on Monday of her zoloft.  She says she already feels more reassured  after our discussion today. She will check her temp daily as well.  ROS: See pertinent positives and negatives per HPI.  Review of Systems  Constitutional: Negative.   HENT: Negative.   Eyes: Negative.   Respiratory: Negative.   Cardiovascular: Negative.   Gastrointestinal: Negative.   Endocrine: Negative.   Genitourinary: Negative.   Musculoskeletal: Negative.   Skin: Negative.   Allergic/Immunologic: Negative.   Neurological: Negative.   Hematological: Negative.   Psychiatric/Behavioral: Negative for agitation, behavioral problems, confusion, decreased concentration, dysphoric mood, hallucinations, self-injury, sleep disturbance and suicidal ideas. The patient is nervous/anxious. The patient is not hyperactive.   All other systems reviewed and are negative.   Patient Active Problem  List   Diagnosis Date Noted  . 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 Primary cancer of right lower lobe of lung ([place]) 04/07/2018  . Lung mass 03/29/2018  . Neck mass 11/17/2017  . Lung nodule, solitary 02/18/2017  . Headache 01/01/2017  . Family history of ASCVD (arteriosclerotic cardiovascular disease) 12/31/2016  . Family history of premature CAD 11/11/2016  . GAD (generalized anxiety disorder) 10/30/2015  . GERD (gastroesophageal reflux disease) 05/29/2014  . Essential hypertension 09/15/2012  . Dysmenorrhea 08/27/2011  . Anxiety and depression 04/23/2011    Social History   Tobacco Use  . Smoking status: Former Smoker    Packs/day: 1.00    Years: 25.00    Pack years: 25.00    Types: Cigarettes    Last attempt to quit: 11/22/2015    Years since quitting: 2.8  . Smokeless tobacco: Never Used  Substance Use Topics  . Alcohol use: Yes    Comment: OCC    Current Outpatient Medications:  .  ALPRAZolam (XANAX) 0.5 MG tablet, Take 1 tablet (0.5 mg total) by mouth at bedtime as needed., Disp: 30 tablet, Rfl: 5 .  Aspirin-Caffeine (BC FAST PAIN RELIEF PO), Take 1 packet by mouth daily as needed (headache)., Disp: , Rfl:  .  ferrous sulfate (FEROSUL) 325 (65 FE) MG tablet, TAKE 1 TABLET(325 MG) BY MOUTH TWICE DAILY WITH A MEAL, Disp: 180 tablet, Rfl: 3 .  lisinopril (PRINIVIL,ZESTRIL) 10 MG tablet, TAKE 1 TABLET(10 MG) BY MOUTH DAILY, Disp: 90 tablet, Rfl: 1 .  Multiple Vitamin (MULTIVITAMIN WITH MINERALS) TABS tablet, Take 2 tablets by mouth daily. Juice Plus fruit and vegetables, Disp: , Rfl:  .  omeprazole (PRILOSEC) 40 MG capsule, TAKE 1 CAPSULE(40 MG) BY MOUTH EVERY MORNING, Disp: 90 capsule, Rfl: 3 .  Phenylephrine-APAP-guaiFENesin (TYLENOL SINUS SEVERE PO), Take 2 tablets by mouth daily as needed (sinus headaches)., Disp: , Rfl:  .  sertraline (ZOLOFT) 100 MG tablet, Take 1.5 tabs qd, Disp: 135 tablet, Rfl: 1 .  valACYclovir (VALTREX) 500 MG tablet, TAKE 2 TABLETS BY  MOUTH TODAY, THEN 2 TABLETS TWICE DAILY AS NEEDED., Disp: 60 tablet, Rfl: 0  No Known Allergies  Objective:   .BP 140/84   VITALS: Per patient if applicable, see vitals. GENERAL: Alert, appears well and in no acute distress. HEENT: Atraumatic, conjunctiva clear, no obvious abnormalities on inspection of external nose and ears. NECK: Normal movements of the head and neck. CARDIOPULMONARY: No increased WOB. Speaking in clear sentences. I:E ratio WNL.  MS: Moves all visible extremities without noticeable abnormality. PSYCH: Pleasant and cooperative, well-groomed. Speech normal rate and rhythm. Affect is appropriate. Insight and judgement are appropriate. Attention is focused, linear, and appropriate.  NEURO: CN grossly intact. Oriented as arrived to appointment on time with no prompting. Moves both  UE equally.  SKIN: No obvious lesions, wounds, erythema, or cyanosis noted on face or hands.  Assessment and Plan:   Elandra was seen today for follow-up.  Diagnoses and all orders for this visit:  Primary cancer of right lower lobe of lung (HCC)  Anxiety and depression  Other orders -     Discontinue: ferrous sulfate (FEROSUL) 325 (65 FE) MG tablet; TAKE 1 TABLET(325 MG) BY MOUTH TWICE DAILY WITH A MEAL -     ferrous sulfate (FEROSUL) 325 (65 FE) MG tablet; TAKE 1 TABLET(325 MG) BY MOUTH TWICE DAILY WITH A MEAL    . Reviewed expectations re: course of current medical issues. . Discussed self-management of symptoms. . Outlined signs and symptoms indicating need for more acute intervention. . Patient verbalized understanding and all questions were answered. Marland Kitchen Health Maintenance issues including appropriate healthy diet, exercise, and smoking avoidance were discussed with patient. . See orders for this visit as documented in the electronic medical record.  Arnette Norris, MD 10/04/2018

## 2018-10-04 ENCOUNTER — Other Ambulatory Visit: Payer: Self-pay

## 2018-10-04 ENCOUNTER — Ambulatory Visit (INDEPENDENT_AMBULATORY_CARE_PROVIDER_SITE_OTHER): Payer: BLUE CROSS/BLUE SHIELD | Admitting: Family Medicine

## 2018-10-04 VITALS — BP 140/84

## 2018-10-04 DIAGNOSIS — F329 Major depressive disorder, single episode, unspecified: Secondary | ICD-10-CM

## 2018-10-04 DIAGNOSIS — I10 Essential (primary) hypertension: Secondary | ICD-10-CM | POA: Diagnosis not present

## 2018-10-04 DIAGNOSIS — F419 Anxiety disorder, unspecified: Secondary | ICD-10-CM | POA: Diagnosis not present

## 2018-10-04 DIAGNOSIS — C3431 Malignant neoplasm of lower lobe, right bronchus or lung: Secondary | ICD-10-CM | POA: Diagnosis not present

## 2018-10-04 MED ORDER — FERROUS SULFATE 325 (65 FE) MG PO TABS: ORAL_TABLET | ORAL | 3 refills | Status: DC

## 2018-10-04 MED ORDER — SERTRALINE HCL 100 MG PO TABS: 200.0000 mg | ORAL_TABLET | Freq: Every day | ORAL | 3 refills | Status: DC

## 2018-10-04 NOTE — Assessment & Plan Note (Signed)
Well controlled. No changes made today. 

## 2018-10-04 NOTE — Assessment & Plan Note (Signed)
>  25 minutes spent in face to face time with patient, >50% spent in counselling or coordination of care.  She is able to breathe through panic attacks but still having significant symptoms.  We agreed to try increasing her zoloft from 150 mg daily to 200 mg daily.  She will follow up with me in 2- 3 weeks. The patient indicates understanding of these issues and agrees with the plan.

## 2018-10-08 ENCOUNTER — Other Ambulatory Visit: Payer: Self-pay

## 2018-10-08 ENCOUNTER — Telehealth (INDEPENDENT_AMBULATORY_CARE_PROVIDER_SITE_OTHER): Payer: BLUE CROSS/BLUE SHIELD | Admitting: Internal Medicine

## 2018-10-08 ENCOUNTER — Encounter: Payer: Self-pay | Admitting: Internal Medicine

## 2018-10-08 VITALS — BP 120/84 | HR 76 | Ht 64.0 in | Wt 167.0 lb

## 2018-10-08 DIAGNOSIS — C3491 Malignant neoplasm of unspecified part of right bronchus or lung: Secondary | ICD-10-CM | POA: Diagnosis not present

## 2018-10-08 DIAGNOSIS — Z85118 Personal history of other malignant neoplasm of bronchus and lung: Secondary | ICD-10-CM | POA: Insufficient documentation

## 2018-10-08 DIAGNOSIS — R0609 Other forms of dyspnea: Secondary | ICD-10-CM | POA: Diagnosis not present

## 2018-10-08 DIAGNOSIS — R002 Palpitations: Secondary | ICD-10-CM | POA: Diagnosis not present

## 2018-10-08 DIAGNOSIS — R0600 Dyspnea, unspecified: Secondary | ICD-10-CM

## 2018-10-08 NOTE — Patient Instructions (Signed)
Medication Instructions:  Your physician recommends that you continue on your current medications as directed. Please refer to the Current Medication list given to you today.  If you need a refill on your cardiac medications before your next appointment, please call your pharmacy.   Lab work: None ordered If you have labs (blood work) drawn today and your tests are completely normal, you will receive your results only by: Marland Kitchen MyChart Message (if you have MyChart) OR . A paper copy in the mail If you have any lab test that is abnormal or we need to change your treatment, we will call you to review the results.  Testing/Procedures: None ordered  Follow-Up: At [facility], you and your health needs are our priority.  As part of our continuing mission to provide you with exceptional heart care, we have created designated Provider Care Teams.  These Care Teams include your primary Cardiologist (physician) and Advanced Practice Providers (APPs -  Physician Assistants and Nurse Practitioners) who all work together to provide you with the care you need, when you need it.  . Your physician recommends that you schedule a follow-up appointment in: 6 months   Any Other Special Instructions Will Be Listed Below (If Applicable). Please let us know if your palpitations/anxiety do not continue to improve.

## 2018-10-08 NOTE — Progress Notes (Signed)
Virtual Visit via Video Note   This visit type was conducted due to national recommendations for restrictions regarding the COVID-19 Pandemic (e.g. social distancing) in an effort to limit this patient's exposure and mitigate transmission in our community.  Due to her co-morbid illnesses, this patient is at least at moderate risk for complications without adequate follow up.  This format is felt to be most appropriate for this patient at this time.  All issues noted in this document were discussed and addressed.  A limited physical exam was performed with this format.  Please refer to the patient's chart for her consent to telehealth for [facility].   Evaluation Performed:  Follow-up visit  Date:  10/08/2018   ID:  Catherine Gilbert, DOB 08/11/1975, MRN [id number]  Patient Location: Home  Provider Location: Office  PCP:  Lucille Passy, MD Cardiologist:  Nelva Bush, MD  Electrophysiologist:  None   Chief Complaint:  Shortness of breath  History of Present Illness:    Catherine Gilbert is a 43 y.o. female who presents via audio/video conferencing for a telehealth visit today.  She has a history of hypertension, prior tobacco abuse, headaches, depression, and anxiety.  We are speaking today for follow-up of her dyspnea on exertion.  I last saw Catherine Gilbert in 01/2017, which time she reported stable exertional dyspnea.  She was able to exercise for 10 to 15 minutes on her elliptical trainer before getting short of breath.  Previous exercise tolerance test and coronary calcium score were unrevealing.  We agreed to defer additional testing.  In the meantime, she was diagnosed with lung adenocarcinoma cancer and is status post right thoracotomy and wedge resection by Dr. Faith Rogue.  Today, Catherine Gilbert reports that she is doing fairly well.  She still has occasional right-sided chest pain, which is non-exertional and is most pronounced near the thoracotomy site.  She also has occasional sharp central or  right-sided chest pain when breathing, which is sharp and 3/10 in intensity.  It only lasts 1-2 seconds and resolves spontaneously.  Exertional dyspnea is unchanged.  Since the coronavirus pandemic began, Catherine Gilbert has been feeling quite anxious and has the sensation that her heart is racing.  She recently spoke with her PCP about this, which led to dose escalation of Zoloft.  She feels like her symptoms have already started to improve.  The patient does not have symptoms concerning for COVID-19 infection (fever, chills, cough, or new shortness of breath).    Past Medical History:  Diagnosis Date  . Abnormal Pap smear   . Anemia   . Anxiety   . Anxiety state 04/23/2011  . Depression   . Dysmenorrhea 08/27/2011  . Dyspnea on exertion 10/30/2015  . Essential hypertension 09/15/2012  . Family history of ASCVD (arteriosclerotic cardiovascular disease) 12/31/2016  . Family history of premature CAD 11/11/2016  . GAD (generalized anxiety disorder) 10/30/2015  . GERD (gastroesophageal reflux disease) 05/29/2014  . Headache 01/01/2017  . HSV (herpes simplex virus) infection   . Hypertension   . Initiation of Depo Provera 09/24/2011  . Lung nodule    RLL on coronary calcium score CT  . Migraine headache    Past Surgical History:  Procedure Laterality Date  . CESAREAN SECTION     x 3   . COLPOSCOPY  2010  . DILATION AND CURETTAGE OF UTERUS    . FLEXIBLE BRONCHOSCOPY N/A 03/29/2018   Procedure: PREOP  BRONCHOSCOPY;  Surgeon: Nestor Lewandowsky, MD;  Location: [facility]  ORS;  Service: Thoracic;  Laterality: N/A;  . THORACOTOMY Right 03/29/2018   Procedure: THORACOTOMY MAJOR- POSSIBLE LOBECTOMY;  Surgeon: Nestor Lewandowsky, MD;  Location: [facility] ORS;  Service: Thoracic;  Laterality: Right;  . TUBAL LIGATION    . VIDEO ASSISTED THORACOSCOPY (VATS)/WEDGE RESECTION Right 03/29/2018   Procedure: VIDEO ASSISTED THORACOSCOPY (VATS)/WEDGE RESECTION;  Surgeon: Nestor Lewandowsky, MD;  Location: [facility] ORS;  Service: Thoracic;   Laterality: Right;     Current Meds  Medication Sig  . ALPRAZolam (XANAX) 0.5 MG tablet Take 1 tablet (0.5 mg total) by mouth at bedtime as needed.  . ferrous sulfate (FEROSUL) 325 (65 FE) MG tablet TAKE 1 TABLET(325 MG) BY MOUTH TWICE DAILY WITH A MEAL  . lisinopril (PRINIVIL,ZESTRIL) 10 MG tablet TAKE 1 TABLET(10 MG) BY MOUTH DAILY  . Multiple Vitamin (MULTIVITAMIN WITH MINERALS) TABS tablet Take 2 tablets by mouth daily. Juice Plus fruit and vegetables  . omeprazole (PRILOSEC) 40 MG capsule TAKE 1 CAPSULE(40 MG) BY MOUTH EVERY MORNING  . Phenylephrine-APAP-guaiFENesin (TYLENOL SINUS SEVERE PO) Take 2 tablets by mouth daily as needed (sinus headaches).  . sertraline (ZOLOFT) 100 MG tablet Take 2 tablets (200 mg total) by mouth daily.  . valACYclovir (VALTREX) 500 MG tablet TAKE 2 TABLETS BY MOUTH TODAY, THEN 2 TABLETS TWICE DAILY AS NEEDED.     Allergies:   Patient has no known allergies.   Social History   Tobacco Use  . Smoking status: Former Smoker    Packs/day: 1.00    Years: 25.00    Pack years: 25.00    Types: Cigarettes    Last attempt to quit: 11/22/2015    Years since quitting: 2.8  . Smokeless tobacco: Never Used  Substance Use Topics  . Alcohol use: Yes    Comment: OCC  . Drug use: No     Family Hx: The patient's family history includes Breast cancer in her cousin; Breast cancer (age of onset: 34) in her maternal grandmother; Heart disease in her sister; Heart disease (age of onset: 94) in her brother; Heart disease (age of onset: 71) in her father; Hypertension in her father; Stroke in her sister; Stroke (age of onset: 71) in her mother.  ROS:   Please see the history of present illness.   All other systems reviewed and are negative.   Prior CV studies:   The following studies were reviewed today:   Coronary calcium score CT (01/19/17): Coronary artery calcium score 0. Normal ascending aorta size. Incidentally noted 6 mm right lower lobe lung nodule.   Exercise tolerance test (01/07/17): Good exercise capacity with normal heart rate and blood pressure response. No significant ST segment or T-wave abnormalities. No arrhythmias. Low risk study ([facility] treadmill score 9).  Labs/Other Tests and Data Reviewed:    EKG:  An ECG dated 03/30/2018 was personally reviewed today and demonstrated:  NSR with non-specific T-wave abnormality.  Recent Labs: 11/17/2017: TSH 1.44 03/23/2018: ALT 24 03/30/2018: BUN 11; Creatinine, Ser 0.70; Hemoglobin 12.3; Platelets 202; Potassium 4.3; Sodium 138   Recent Lipid Panel Lab Results  Component Value Date/Time   CHOL 145 11/17/2017 10:35 AM   TRIG 140.0 11/17/2017 10:35 AM   HDL 33.20 (L) 11/17/2017 10:35 AM   CHOLHDL 4 11/17/2017 10:35 AM   LDLCALC 84 11/17/2017 10:35 AM    Wt Readings from Last 3 Encounters:  10/08/18 167 lb (75.8 kg)  08/23/18 170 lb 3.2 oz (77.2 kg)  05/31/18 165 lb (74.8 kg)     Objective:  Vital Signs:  BP 120/84 (BP Location: Left Arm, Patient Position: Sitting, Cuff Size: Normal)   Pulse 76   Ht _0  (1.626 m)   Wt 167 lb (75.8 kg)   BMI 28.67 kg/m    Well nourished, well developed female in no acute distress.  ASSESSMENT & PLAN:    Dyspnea on exertion: Chronic and stable.  Prior cardiac workup was unrevealing.  We will defer further testing at this time.  Palpitations: Symptoms most consistent with anxiety and improving after dose increase of Zoloft.  If symptoms do not abate over the next months, we will need to consider event monitoring.  Lung cancer: Patient has recovered well from wedge resection of right lung adenocarcinoma.  She should continue routine follow-up with thoracic surgery and oncology.  COVID-19 Education: The signs and symptoms of COVID-19 were discussed with the patient and how to seek care for testing (follow up with PCP or arrange E-visit).  The importance of social distancing was discussed today.  Time:   Today, I have spent 12 minutes with  the patient with telehealth technology discussing the above problems.     Medication Adjustments/Labs and Tests Ordered: Current medicines are reviewed at length with the patient today.  Concerns regarding medicines are outlined above.   Tests Ordered: None.  Medication Changes: None.  Disposition:  Follow up in 6 month(s)  Signed, Nelva Bush, MD  10/08/2018 7:23 PM    [facility]

## 2018-10-10 ENCOUNTER — Encounter: Payer: Self-pay | Admitting: Family Medicine

## 2018-10-19 ENCOUNTER — Ambulatory Visit (INDEPENDENT_AMBULATORY_CARE_PROVIDER_SITE_OTHER): Payer: BLUE CROSS/BLUE SHIELD | Admitting: Family Medicine

## 2018-10-19 DIAGNOSIS — F329 Major depressive disorder, single episode, unspecified: Secondary | ICD-10-CM

## 2018-10-19 DIAGNOSIS — C3431 Malignant neoplasm of lower lobe, right bronchus or lung: Secondary | ICD-10-CM | POA: Diagnosis not present

## 2018-10-19 DIAGNOSIS — F419 Anxiety disorder, unspecified: Secondary | ICD-10-CM

## 2018-10-19 DIAGNOSIS — F32A Depression, unspecified: Secondary | ICD-10-CM

## 2018-10-19 NOTE — Assessment & Plan Note (Signed)
>  25 minutes spent in face to face time with patient, >50% spent in counselling or coordination of care. Improved- GAD7 down to 2 from 13 just two weeks ago .  Continue current doses of rxs. GAD 7 : Generalized Anxiety Score 10/19/2018 10/04/2018 09/29/2018  Nervous, Anxious, on Edge 1 3 3   Control/stop worrying 0 3 2  Worry too much - different things 0 2 3  Trouble relaxing 0 0 2  Restless 0 0 0  Easily annoyed or irritable 1 2 2   Afraid - awful might happen 0 3 2  Total GAD 7 Score 2 13 14   Anxiety Difficulty Not difficult at all Very difficult Very difficult

## 2018-10-19 NOTE — Progress Notes (Signed)
Virtual Visit via Video   I connected with Catherine Gilbert on 10/19/18 at  9:00 AM EDT by a video enabled telemedicine application and verified that I am speaking with the correct person using two identifiers. Location patient: Home Location provider: [facility], Office Persons participating in the virtual visit: Haze Rushing, MD   I discussed the limitations of evaluation and management by telemedicine and the availability of in person appointments. The patient expressed understanding and agreed to proceed.  Subjective:   HPI:  Anxiety- last saw pt via televisit for this issue on 10/04/18.  Note reviewed. At that time, she had been taking zoloft 150 mg daily and as needed xanax.  At that [place] she stated she had not been staying asleep. She was having difficulty staying asleep and is tossing and turning throughout the night.She was doing fine with her anxiety and depression until this virus happened.She is fearful of the virus and that it may effect her lungs.She is now about19months out from her right thoracotomy and wedge resection due to primary cancer of right lower lobe of lung.  We agreed to try increasing her zoloft from 150 mg daily to 200 mg daily.  States that she is doing much better than she was. Still has some instances but not nearly as much as before. Does not need any refills at this time.  PDMP reviewed- no red flags.  Last filled xanax on 09/29/18. GAD 7 : Generalized Anxiety Score 10/19/2018 10/04/2018 09/29/2018  Nervous, Anxious, on Edge 1 3 3   Control/stop worrying 0 3 2  Worry too much - different things 0 2 3  Trouble relaxing 0 0 2  Restless 0 0 0  Easily annoyed or irritable 1 2 2   Afraid - awful might happen 0 3 2  Total GAD 7 Score 2 13 14   Anxiety Difficulty Not difficult at all Very difficult Very difficult      Office Visit from 10/19/2018 in [facility]  PHQ-9 Total Score  7     Review of Systems  Constitutional:  Negative.   HENT: Negative.   Respiratory: Negative.   Cardiovascular: Negative.   Endocrine: Negative.   Genitourinary: Negative.   Allergic/Immunologic: Negative.   Neurological: Negative.   Hematological: Negative.   Psychiatric/Behavioral: Negative.  Negative for agitation, behavioral problems, confusion, decreased concentration, dysphoric mood, self-injury, sleep disturbance and suicidal ideas. The patient is not nervous/anxious.   All other systems reviewed and are negative.    ROS: See pertinent positives and negatives per HPI.  Patient Active Problem List   Diagnosis Date Noted  . Palpitations 10/08/2018  . Primary adenocarcinoma of right lung ([place]) 10/08/2018  . 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 Primary cancer of right lower lobe of lung ([place]) 04/07/2018  . Lung mass 03/29/2018  . Neck mass 11/17/2017  . Lung nodule, solitary 02/18/2017  . Headache 01/01/2017  . Family history of ASCVD (arteriosclerotic cardiovascular disease) 12/31/2016  . Family history of premature CAD 11/11/2016  . GAD (generalized anxiety disorder) 10/30/2015  . Dyspnea on exertion 10/30/2015  . GERD (gastroesophageal reflux disease) 05/29/2014  . Essential hypertension 09/15/2012  . Dysmenorrhea 08/27/2011  . Anxiety and depression 04/23/2011    Social History   Tobacco Use  . Smoking status: Former Smoker    Packs/day: 1.00    Years: 25.00    Pack years: 25.00    Types: Cigarettes    Last attempt to quit: 11/22/2015  Years since quitting: 2.9  . Smokeless tobacco: Never Used  Substance Use Topics  . Alcohol use: Yes    Comment: OCC    Current Outpatient Medications:  .  ALPRAZolam (XANAX) 0.5 MG tablet, Take 1 tablet (0.5 mg total) by mouth at bedtime as needed., Disp: 30 tablet, Rfl: 5 .  Aspirin-Caffeine (BC FAST PAIN RELIEF PO), Take 1 packet by mouth daily as needed (headache)., Disp: , Rfl:  .  ferrous sulfate (FEROSUL) 325 (65 FE) MG tablet, TAKE 1 TABLET(325  MG) BY MOUTH TWICE DAILY WITH A MEAL, Disp: 180 tablet, Rfl: 3 .  lisinopril (PRINIVIL,ZESTRIL) 10 MG tablet, TAKE 1 TABLET(10 MG) BY MOUTH DAILY, Disp: 90 tablet, Rfl: 1 .  Multiple Vitamin (MULTIVITAMIN WITH MINERALS) TABS tablet, Take 2 tablets by mouth daily. Juice Plus fruit and vegetables, Disp: , Rfl:  .  omeprazole (PRILOSEC) 40 MG capsule, TAKE 1 CAPSULE(40 MG) BY MOUTH EVERY MORNING, Disp: 90 capsule, Rfl: 3 .  Phenylephrine-APAP-guaiFENesin (TYLENOL SINUS SEVERE PO), Take 2 tablets by mouth daily as needed (sinus headaches)., Disp: , Rfl:  .  sertraline (ZOLOFT) 100 MG tablet, Take 2 tablets (200 mg total) by mouth daily., Disp: 30 tablet, Rfl: 3 .  valACYclovir (VALTREX) 500 MG tablet, TAKE 2 TABLETS BY MOUTH TODAY, THEN 2 TABLETS TWICE DAILY AS NEEDED., Disp: 60 tablet, Rfl: 0  No Known Allergies  Objective:  There were no vitals taken for this visit.  VITALS: Per patient if applicable, see vitals. GENERAL: Alert, appears well and in no acute distress. HEENT: Atraumatic, conjunctiva clear, no obvious abnormalities on inspection of external nose and ears. NECK: Normal movements of the head and neck. CARDIOPULMONARY: No increased WOB. Speaking in clear sentences. I:E ratio WNL.  MS: Moves all visible extremities without noticeable abnormality. PSYCH: Pleasant and cooperative, well-groomed. Speech normal rate and rhythm. Affect is appropriate. Insight and judgement are appropriate. Attention is focused, linear, and appropriate.  NEURO: CN grossly intact. Oriented as arrived to appointment on time with no prompting. Moves both UE equally.  SKIN: No obvious lesions, wounds, erythema, or cyanosis noted on face or hands.  Assessment and Plan:   Catherine Gilbert was seen today for follow-up.  Diagnoses and all orders for this visit:  Anxiety and depression  Primary cancer of right lower lobe of lung ([place])    . Reviewed expectations re: course of current medical issues. . Discussed  self-management of symptoms. . Outlined signs and symptoms indicating need for more acute intervention. . Patient verbalized understanding and all questions were answered. Marland Kitchen Health Maintenance issues including appropriate healthy diet, exercise, and smoking avoidance were discussed with patient. . See orders for this visit as documented in the electronic medical record.  Arnette Norris, MD 10/19/2018

## 2018-10-29 ENCOUNTER — Other Ambulatory Visit: Payer: Self-pay | Admitting: Family Medicine

## 2018-11-01 ENCOUNTER — Other Ambulatory Visit: Payer: Self-pay

## 2018-11-01 MED ORDER — VALACYCLOVIR HCL 500 MG PO TABS: ORAL_TABLET | ORAL | 0 refills | Status: DC

## 2018-11-01 NOTE — Telephone Encounter (Signed)
Patient is requesting a refill on valtrex. Will go ahead and send in a refill she will also need an appointment to be seen.

## 2018-11-16 ENCOUNTER — Telehealth: Payer: Self-pay | Admitting: Family Medicine

## 2018-11-16 NOTE — Telephone Encounter (Signed)
Copied from [place] [id number]. Topic: Appointment Scheduling - Scheduling Inquiry for Clinic >> Nov 16, 2018  4:11 PM Celene Kras A wrote: Reason for CRM: Pt called requesting to set up a lab appt for her iron please advise.

## 2018-11-17 ENCOUNTER — Telehealth: Payer: Self-pay | Admitting: Family Medicine

## 2018-11-17 ENCOUNTER — Encounter: Payer: Self-pay | Admitting: Family Medicine

## 2018-11-17 DIAGNOSIS — R5383 Other fatigue: Secondary | ICD-10-CM

## 2018-11-17 DIAGNOSIS — C3431 Malignant neoplasm of lower lobe, right bronchus or lung: Secondary | ICD-10-CM

## 2018-11-17 NOTE — Telephone Encounter (Signed)
Yes of course.  Lab orders entered.  Okay to schedule.

## 2018-11-17 NOTE — Addendum Note (Signed)
Addended by: Lucille Passy on: 11/17/2018 01:29 PM   Modules accepted: Orders

## 2018-11-17 NOTE — Telephone Encounter (Signed)
Pt called and wanted to schedule a lab because her physical was rescheduled a while back for July but she said she has been feeling tired lately and wanted to see if she could get labs done this week.

## 2018-11-18 DIAGNOSIS — F4322 Adjustment disorder with anxiety: Secondary | ICD-10-CM | POA: Diagnosis not present

## 2018-11-18 NOTE — Addendum Note (Signed)
Addended by: Lynnea Ferrier on: 11/18/2018 02:03 PM   Modules accepted: Orders

## 2018-11-18 NOTE — Telephone Encounter (Signed)
Orders have been entered & patient is scheduled for lab draw tomorrow.

## 2018-11-19 ENCOUNTER — Other Ambulatory Visit (INDEPENDENT_AMBULATORY_CARE_PROVIDER_SITE_OTHER): Payer: BLUE CROSS/BLUE SHIELD

## 2018-11-19 DIAGNOSIS — R5383 Other fatigue: Secondary | ICD-10-CM | POA: Diagnosis not present

## 2018-11-19 DIAGNOSIS — C3431 Malignant neoplasm of lower lobe, right bronchus or lung: Secondary | ICD-10-CM

## 2018-11-19 IMAGING — DX DG CHEST 1V PORT
1 series · 1 of 1 positions shown · non-contrast
Comparison: Radiograph 03/29/2018.

CLINICAL DATA: Postop check.  Post right thoracotomy.

EXAM:
PORTABLE CHEST 1 VIEW

[chest ap]
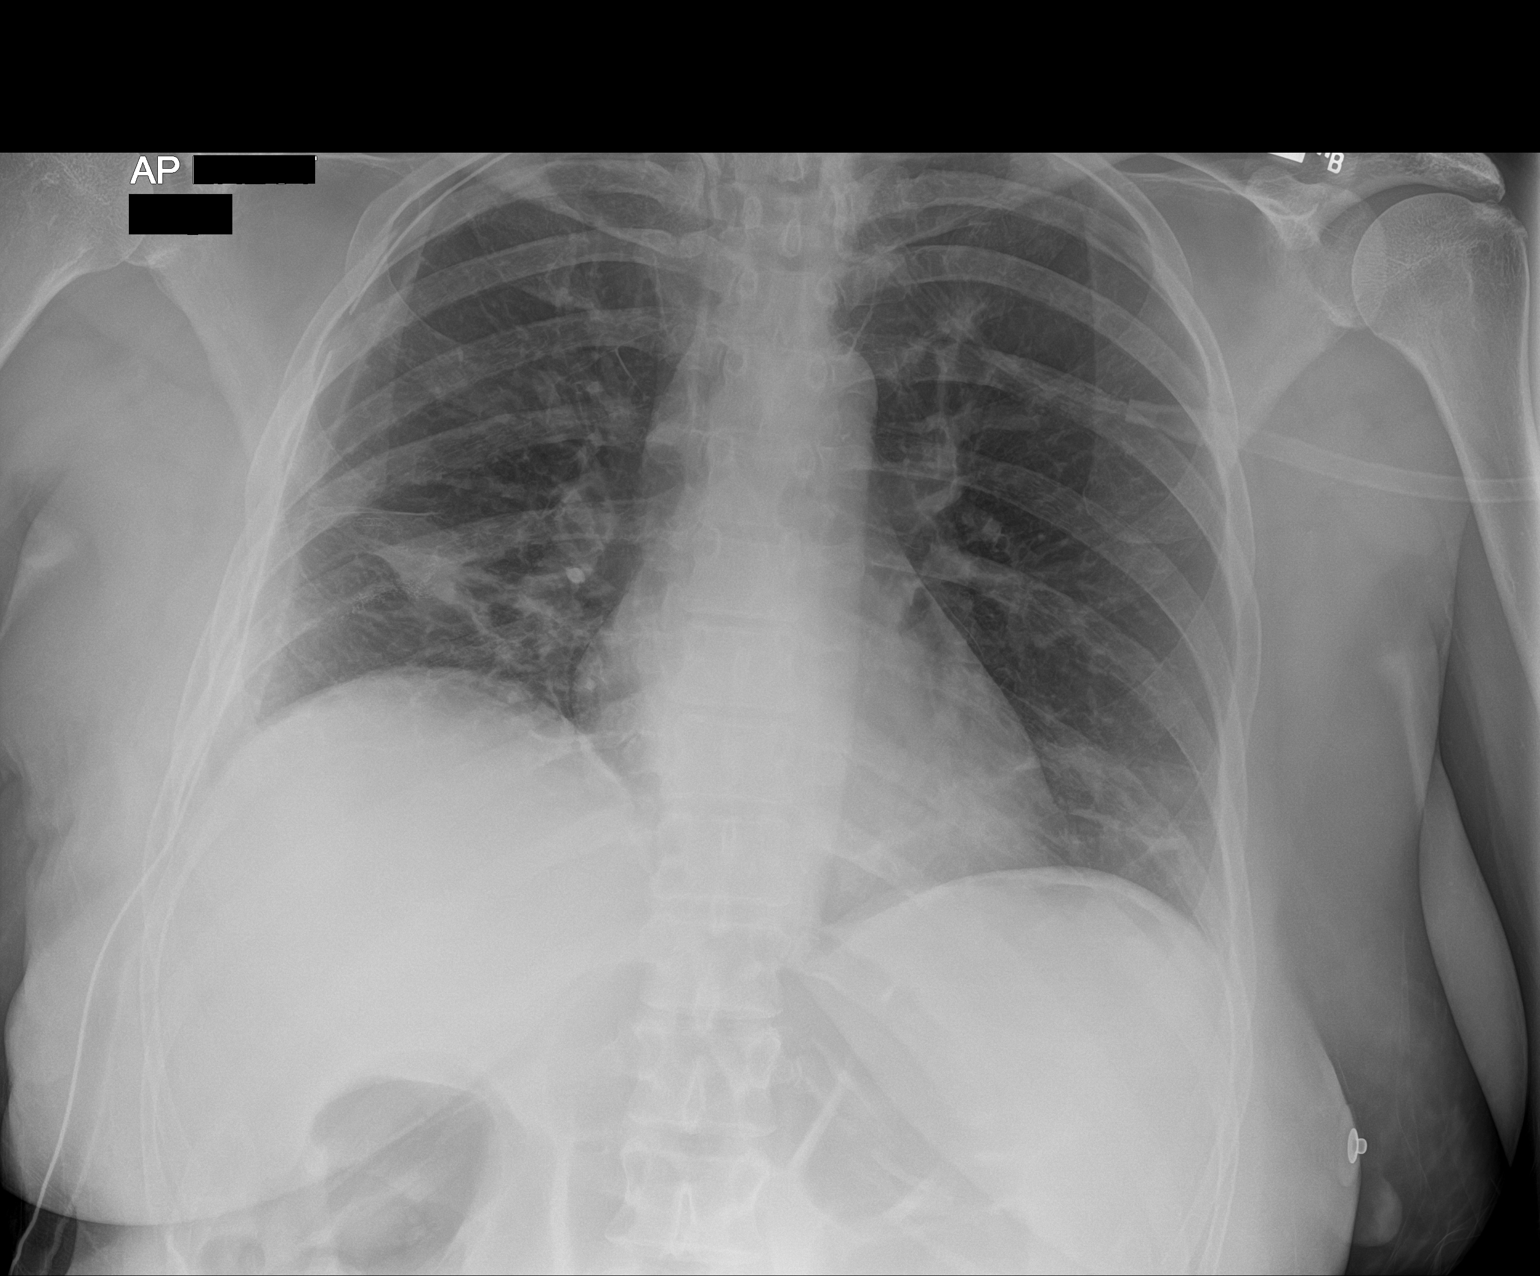

[1 of 1 positions shown; findings below may reference images not displayed]

FINDINGS: Right chest tube in place with tip at the apex. No visualized
pneumothorax. Decreased subcutaneous emphysema the right chest wall.
Chain sutures in the right lower lung zone with adjacent ill-defined
opacity, likely atelectasis. No large pleural effusion. Mild streaky
atelectasis at the left lung base. Normal heart size and mediastinal
contours. No pulmonary edema.
IMPRESSION: Right chest tube in place without pneumothorax. Streaky bibasilar
opacities favor atelectasis.

## 2018-11-20 LAB — VITAMIN B12: Vitamin B-12: 1237 pg/mL — ABNORMAL HIGH (ref 200–1100)

## 2018-11-20 LAB — COMPREHENSIVE METABOLIC PANEL
AG Ratio: 2 (calc) (ref 1.0–2.5)
ALT: 16 U/L (ref 6–29)
AST: 16 U/L (ref 10–30)
Albumin: 4.7 g/dL (ref 3.6–5.1)
Alkaline phosphatase (APISO): 36 U/L (ref 31–125)
BUN: 11 mg/dL (ref 7–25)
CO2: 28 mmol/L (ref 20–32)
Calcium: 9.8 mg/dL (ref 8.6–10.2)
Chloride: 100 mmol/L (ref 98–110)
Creat: 0.82 mg/dL (ref 0.50–1.10)
Globulin: 2.3 g/dL (calc) (ref 1.9–3.7)
Glucose, Bld: 89 mg/dL (ref 65–99)
Potassium: 3.9 mmol/L (ref 3.5–5.3)
Sodium: 137 mmol/L (ref 135–146)
Total Bilirubin: 0.5 mg/dL (ref 0.2–1.2)
Total Protein: 7 g/dL (ref 6.1–8.1)

## 2018-11-20 LAB — CBC WITH DIFFERENTIAL/PLATELET
Absolute Monocytes: 748 cells/uL (ref 200–950)
Basophils Absolute: 17 cells/uL (ref 0–200)
Basophils Relative: 0.2 %
Eosinophils Absolute: 94 cells/uL (ref 15–500)
Eosinophils Relative: 1.1 %
HCT: 38.3 % (ref 35.0–45.0)
Hemoglobin: 13.4 g/dL (ref 11.7–15.5)
Lymphs Abs: 2627 cells/uL (ref 850–3900)
MCH: 31.4 pg (ref 27.0–33.0)
MCHC: 35 g/dL (ref 32.0–36.0)
MCV: 89.7 fL (ref 80.0–100.0)
MPV: 10.2 fL (ref 7.5–12.5)
Monocytes Relative: 8.8 %
Neutro Abs: 5015 cells/uL (ref 1500–7800)
Neutrophils Relative %: 59 %
Platelets: 262 10*3/uL (ref 140–400)
RBC: 4.27 10*6/uL (ref 3.80–5.10)
RDW: 12.3 % (ref 11.0–15.0)
Total Lymphocyte: 30.9 %
WBC: 8.5 10*3/uL (ref 3.8–10.8)

## 2018-11-20 LAB — T4, FREE: Free T4: 1 ng/dL (ref 0.8–1.8)

## 2018-11-20 LAB — FERRITIN: Ferritin: 40 ng/mL (ref 16–232)

## 2018-11-20 LAB — TSH: TSH: 1 mIU/L

## 2018-11-20 LAB — VITAMIN D 25 HYDROXY (VIT D DEFICIENCY, FRACTURES): Vit D, 25-Hydroxy: 27 ng/mL — ABNORMAL LOW (ref 30–100)

## 2018-11-23 ENCOUNTER — Encounter: Payer: BLUE CROSS/BLUE SHIELD | Admitting: Family Medicine

## 2018-11-24 ENCOUNTER — Encounter: Payer: Self-pay | Admitting: Family Medicine

## 2018-11-29 ENCOUNTER — Telehealth: Payer: Self-pay | Admitting: Internal Medicine

## 2018-11-29 NOTE — Telephone Encounter (Signed)
Patient calling Patient is having billing issues and would like to speak with nurse States that she will need a modifier for bill to be resubmitted Patient was also given the [place] billing number to check to see if they will be able to work on resubmitting Please advise or call to discuss

## 2018-11-30 NOTE — Telephone Encounter (Signed)
Caren Griffins,  Can your billing team take a look at this encounter and help this patient?  Thanks,  Izora Gala

## 2018-11-30 NOTE — Telephone Encounter (Signed)
Called patient. Being charged a deductible verses a co-pay.  Filled incorrectly with wrong code and needs to be resubmitted.  CAM-176 Second set of calls to get this bill figured out. She's talked to billing who told her to call her insurance.  Insurance told her to call the office and then we gave her a number to call. It keeps going around and she is frustrated.  She said she already called billing yesterday. My scheduler gave me this number to call [phone].   Advised I will see what I can find out from my end and let her know.

## 2018-11-30 NOTE — Telephone Encounter (Signed)
I sent a message to [facility], [employer] to look into this matter to see if it needs the modifer and to be resubmitted to the insurance company. I notified the patient because I needed the date of service and I let her know we are taking care of this matter.  Thank you

## 2018-12-09 DIAGNOSIS — F4322 Adjustment disorder with anxiety: Secondary | ICD-10-CM | POA: Diagnosis not present

## 2018-12-20 ENCOUNTER — Encounter: Payer: Self-pay | Admitting: Family Medicine

## 2018-12-28 ENCOUNTER — Other Ambulatory Visit: Payer: Self-pay | Admitting: Family Medicine

## 2018-12-28 DIAGNOSIS — Z1231 Encounter for screening mammogram for malignant neoplasm of breast: Secondary | ICD-10-CM

## 2019-01-04 ENCOUNTER — Encounter: Payer: BLUE CROSS/BLUE SHIELD | Admitting: Family Medicine

## 2019-01-24 ENCOUNTER — Other Ambulatory Visit: Payer: Self-pay

## 2019-01-24 ENCOUNTER — Other Ambulatory Visit: Payer: Self-pay | Admitting: Obstetrics and Gynecology

## 2019-01-24 ENCOUNTER — Ambulatory Visit
Admission: RE | Admit: 2019-01-24 | Discharge: 2019-01-24 | Disposition: A | Discharge status: Home / Self Care | Payer: BC Managed Care – PPO | Source: Ambulatory Visit | Attending: Family Medicine | Admitting: Family Medicine

## 2019-01-24 DIAGNOSIS — Z1231 Encounter for screening mammogram for malignant neoplasm of breast: Secondary | ICD-10-CM | POA: Insufficient documentation

## 2019-01-25 ENCOUNTER — Other Ambulatory Visit: Payer: Self-pay | Admitting: Family Medicine

## 2019-01-25 ENCOUNTER — Telehealth: Payer: Self-pay | Admitting: Family Medicine

## 2019-01-25 DIAGNOSIS — N6489 Other specified disorders of breast: Secondary | ICD-10-CM

## 2019-01-25 DIAGNOSIS — R928 Other abnormal and inconclusive findings on diagnostic imaging of breast: Secondary | ICD-10-CM

## 2019-01-25 NOTE — Telephone Encounter (Signed)
I called and left message on patient voicemail to call office and reschedule appointment scheduled for 02/02/2019. Dr. Deborra Medina will not be in office.

## 2019-01-30 ENCOUNTER — Other Ambulatory Visit: Payer: Self-pay | Admitting: Family Medicine

## 2019-02-02 ENCOUNTER — Encounter: Payer: BLUE CROSS/BLUE SHIELD | Admitting: Family Medicine

## 2019-02-09 ENCOUNTER — Ambulatory Visit
Admission: RE | Admit: 2019-02-09 | Discharge: 2019-02-09 | Disposition: A | Discharge status: Home / Self Care | Payer: BC Managed Care – PPO | Source: Ambulatory Visit | Attending: Family Medicine | Admitting: Family Medicine

## 2019-02-09 DIAGNOSIS — R922 Inconclusive mammogram: Secondary | ICD-10-CM | POA: Diagnosis not present

## 2019-02-09 DIAGNOSIS — N6489 Other specified disorders of breast: Secondary | ICD-10-CM | POA: Insufficient documentation

## 2019-02-09 DIAGNOSIS — N6011 Diffuse cystic mastopathy of right breast: Secondary | ICD-10-CM | POA: Diagnosis not present

## 2019-02-09 DIAGNOSIS — R928 Other abnormal and inconclusive findings on diagnostic imaging of breast: Secondary | ICD-10-CM | POA: Diagnosis not present

## 2019-02-11 ENCOUNTER — Telehealth: Payer: Self-pay

## 2019-02-11 NOTE — Telephone Encounter (Signed)
Questions for Screening COVID-19  Symptom onset: None  Travel or Contacts: None  During this illness, did/does the patient experience any of the following symptoms? Fever >100.41F []   Yes [x]   No []   Unknown Subjective fever (felt feverish) []   Yes [x]   No []   Unknown Chills []   Yes [x]   No []   Unknown Muscle aches (myalgia) []   Yes [x]   No []   Unknown Runny nose (rhinorrhea) []   Yes [x]   No []   Unknown Sore throat []   Yes [x]   No []   Unknown Cough (new onset or worsening of chronic cough) []   Yes [x]   No []   Unknown Shortness of breath (dyspnea) []   Yes [x]   No []   Unknown Nausea or vomiting []   Yes [x]   No []   Unknown Headache []   Yes [x]   No []   Unknown Abdominal pain  []   Yes [x]   No []   Unknown Diarrhea (?3 loose/looser than normal stools/24hr period) []   Yes [x]   No []   Unknown Other, specify:  Patient risk factors: Smoker? []   Current []   Former []   Never If female, currently pregnant? []   Yes []   No  Patient Active Problem List   Diagnosis Date Noted  . Palpitations 10/08/2018  . Primary adenocarcinoma of right lung ([place]) 10/08/2018  . 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 Primary cancer of right lower lobe of lung ([place]) 04/07/2018  . Lung mass 03/29/2018  . Neck mass 11/17/2017  . Lung nodule, solitary 02/18/2017  . Headache 01/01/2017  . Family history of ASCVD (arteriosclerotic cardiovascular disease) 12/31/2016  . Family history of premature CAD 11/11/2016  . GAD (generalized anxiety disorder) 10/30/2015  . Dyspnea on exertion 10/30/2015  . GERD (gastroesophageal reflux disease) 05/29/2014  . Essential hypertension 09/15/2012  . Dysmenorrhea 08/27/2011  . Anxiety and depression 04/23/2011    Plan:  []   High risk for COVID-19 with red flags go to ED (with CP, SOB, weak/lightheaded, or fever > 101.5). Call ahead.  []   High risk for COVID-19 but stable. Inform provider and coordinate time for [facility] visit.   []   No red flags but URI signs or symptoms  okay for [facility] visit.

## 2019-02-13 DIAGNOSIS — Z0000 Encounter for general adult medical examination without abnormal findings: Secondary | ICD-10-CM | POA: Insufficient documentation

## 2019-02-13 NOTE — Progress Notes (Signed)
Subjective:   Patient ID: Catherine Gilbert, female    DOB: Apr 23, 1976, 43 y.o.   MRN: [id number]  Catherine Gilbert is a pleasant 43 y.o. year old female who presents to clinic today with Annual Exam (Pt screened at vehicle. Pt is here today for a CPE without PAP. She is currently fasting.)  on 02/14/2019  HPI:  Health Maintenance  Topic Date Due  . INFLUENZA VACCINE  02/21/2019 (Originally 01/29/2019)  . PAP SMEAR-Modifier  11/17/2020  . TETANUS/TDAP  11/18/2027  . HIV Screening  Completed   Doing really well, has lost weight with diet and exercise.  Feels much better since she quit smoking.  Wt Readings from Last 3 Encounters:  02/14/19 163 lb 9.6 oz (74.2 kg)  10/08/18 167 lb (75.8 kg)  08/23/18 170 lb 3.2 oz (77.2 kg)     Last pap smear was on 11/17/17.  No h/o abnormal pap smears in the past 5 years.  Mammogram and [place] of right breast on 02/09/19- found to be benign-  EXAM: DIGITAL DIAGNOSTIC RIGHT MAMMOGRAM WITH CAD AND TOMO  ULTRASOUND RIGHT BREAST  COMPARISON:  Previous exams including recent screening mammogram dated 01/24/2019.  ACR Breast Density Category c: The breast tissue is heterogeneously dense, which may obscure small masses.  FINDINGS: On today's additional diagnostic views, there is no persistent asymmetry within the outer RIGHT breast. Given the heterogeneously dense breast tissues in the outer RIGHT breast, ultrasound will be performed for further characterization.  Mammographic images were processed with CAD.  Targeted ultrasound is performed, evaluating the outer RIGHT breast, showing no suspicious solid or cystic mass. There is a ridge of normal dense fibroglandular tissue at the 8-9 o'clock axis, corresponding to the mammographic findings. There are scattered small benign cysts, corresponding as incidental findings. Largest cyst is located at the 7 o'clock axis, 3 cm from the nipple, measuring 9 mm.  IMPRESSION: No evidence of  malignancy within the RIGHT breast. Small benign cysts within the outer RIGHT breast.  Patient may return to routine annual bilateral screening mammogram schedule.  RECOMMENDATION: Screening mammogram in one year.(Code:[id number]-B-01Y)  Family history of premature CAD-  Lab Results  Component Value Date   CHOL 145 11/17/2017   HDL 33.20 (L) 11/17/2017   LDLCALC 84 11/17/2017   TRIG 140.0 11/17/2017   CHOLHDL 4 11/17/2017   The 10-year ASCVD risk score Catherine Bussing DC Jr., et al., 2013) is: 1.2%   Values used to calculate the score:     Age: 43 years     Sex: Female     Is Non-Hispanic African American: No     Diabetic: No     Tobacco smoker: No     Systolic Blood Pressure: 694 mmHg     Is BP treated: Yes     HDL Cholesterol: 33.2 mg/dL     Total Cholesterol: 145 mg/dL    HTN- has been treated for HTN since she was in her early 51s.Denies any HA, blurred vision, CP or SOB. Has been well controlled on Lisinopril 10 mg daily.  Lab Results  Component Value Date   CREATININE 0.82 11/19/2018   Primary cancer of RLL of lung- s/p right lower lobe wedge resection done by Catherine Gilbert on 03/29/18. Last saw him on 08/23/18.  Note reviewed. He recommended that she follow up with Catherine Gilbert with a CT scan and that she could resume Yoga if she desired.  Has both appointment for CT and with Catherine Gilbert scheduled for October  2020. Quit smoking!  HTN- has lost weight, no longer smoking, having orthostasis. Currently taking lisinopril 10 mg daily.  Anxiety- last saw pt via televisit for this on 10/19/18- note reviewed.  At that time, she was doing much better with zoloft 200 mg daily ( increased from 150 mg on 10/04/18) and as needed xanax. PDMP reviewed.  Xanax last refilled on 11/23/18. She now feels like maybe feels too "numb."  GAD 7 : Generalized Anxiety Score 10/19/2018 10/04/2018 09/29/2018  Nervous, Anxious, on Edge _0 Control/stop worrying 0 3 2  Worry too much - different things  0 2 3  Trouble relaxing 0 0 2  Restless 0 0 0  Easily annoyed or irritable _1 Afraid - awful might happen 0 3 2  Total GAD 7 Score _2 Anxiety Difficulty Not difficult at all Very difficult Very difficult    Depression screen [facility] 2/9 02/14/2019 10/19/2018 10/04/2018 09/29/2018 01/01/2017  Decreased Interest 0 _3 0  Down, Depressed, Hopeless 0 0 1 0 0  PHQ - 2 Score 0 _4 0  Altered sleeping - _5 -  Tired, decreased energy - _6 -  Change in appetite - 0 2 1 -  Feeling bad or failure about yourself  - 0 0 0 -  Trouble concentrating - _7 -  Moving slowly or fidgety/restless - 0 0 0 -  Suicidal thoughts - 0 0 0 -  PHQ-9 Score - _8 -  Difficult doing work/chores - Not difficult at all Somewhat difficult Somewhat difficult -      GERD- feels improved with increased dose of omeprazole.  Current Outpatient Medications on File Prior to Visit  Medication Sig Dispense Refill  . ALPRAZolam (XANAX) 0.5 MG tablet Take 1 tablet (0.5 mg total) by mouth at bedtime as needed. 30 tablet 5  . Aspirin-Caffeine (BC FAST PAIN RELIEF PO) Take 1 packet by mouth daily as needed (headache).    . ferrous sulfate (FEROSUL) 325 (65 FE) MG tablet TAKE 1 TABLET(325 MG) BY MOUTH TWICE DAILY WITH A MEAL 180 tablet 3  . lisinopril (ZESTRIL) 10 MG tablet TAKE 1 TABLET BY MOUTH  DAILY 90 tablet 0  . Multiple Vitamin (MULTIVITAMIN WITH MINERALS) TABS tablet Take 2 tablets by mouth daily. Juice Plus fruit and vegetables    . omeprazole (PRILOSEC) 40 MG capsule TAKE 1 CAPSULE(40 MG) BY MOUTH EVERY MORNING 90 capsule 3  . Phenylephrine-APAP-guaiFENesin (TYLENOL SINUS SEVERE PO) Take 2 tablets by mouth daily as needed (sinus headaches).    . sertraline (ZOLOFT) 100 MG tablet TAKE 1 AND 1/2 TABLETS BY  MOUTH DAILY 135 tablet 0  . valACYclovir (VALTREX) 500 MG tablet TAKE 2 TABLETS BY MOUTH TODAY, THEN 2 TABLETS TWICE DAILY AS NEEDED. 60 tablet 0   No current facility-administered medications on  file prior to visit.     No Known Allergies  Past Medical History:  Diagnosis Date  . Abnormal Pap smear   . Anemia   . Anxiety   . Anxiety state 04/23/2011  . Cancer ([place]) 02/2018   lung rt  . Depression   . Dysmenorrhea 08/27/2011  . Dyspnea on exertion 10/30/2015  . Essential hypertension 09/15/2012  . Family history of ASCVD (arteriosclerotic cardiovascular disease) 12/31/2016  . Family history of premature CAD 11/11/2016  . GAD (generalized anxiety disorder) 10/30/2015  . GERD (gastroesophageal reflux disease) 05/29/2014  . Headache 01/01/2017  .  HSV (herpes simplex virus) infection   . Hypertension   . Initiation of Depo Provera 09/24/2011  . Lung nodule    RLL on coronary calcium score CT  . Migraine headache     Past Surgical History:  Procedure Laterality Date  . BREAST CYST ASPIRATION Right 02/10/2018   neg  . CESAREAN SECTION     x 3   . COLPOSCOPY  2010  . DILATION AND CURETTAGE OF UTERUS    . FLEXIBLE BRONCHOSCOPY N/A 03/29/2018   Procedure: PREOP  BRONCHOSCOPY;  Surgeon: Nestor Lewandowsky, MD;  Location: [facility] ORS;  Service: Thoracic;  Laterality: N/A;  . THORACOTOMY Right 03/29/2018   Procedure: THORACOTOMY MAJOR- POSSIBLE LOBECTOMY;  Surgeon: Nestor Lewandowsky, MD;  Location: [facility] ORS;  Service: Thoracic;  Laterality: Right;  . TUBAL LIGATION    . VIDEO ASSISTED THORACOSCOPY (VATS)/WEDGE RESECTION Right 03/29/2018   Procedure: VIDEO ASSISTED THORACOSCOPY (VATS)/WEDGE RESECTION;  Surgeon: Nestor Lewandowsky, MD;  Location: [facility] ORS;  Service: Thoracic;  Laterality: Right;    Family History  Problem Relation Age of Onset  . Stroke Mother 65       massive stroke  . Heart disease Father 3       congestive heart failure  . Hypertension Father   . Heart disease Brother 44       Suspected MI and SCD  . Heart disease Sister   . Stroke Sister   . Breast cancer Maternal Grandmother 52  . Breast cancer Cousin        x2 paternal    Social History   Socioeconomic History  .  Marital status: Married    Spouse name: Not on file  . Number of children: Not on file  . Years of education: Not on file  . Highest education level: Not on file  Occupational History  . Not on file  Social Needs  . Financial resource strain: Not on file  . Food insecurity    Worry: Not on file    Inability: Not on file  . Transportation needs    Medical: Not on file    Non-medical: Not on file  Tobacco Use  . Smoking status: Former Smoker    Packs/day: 1.00    Years: 25.00    Pack years: 25.00    Types: Cigarettes    Quit date: 11/22/2015    Years since quitting: 3.2  . Smokeless tobacco: Never Used  Substance and Sexual Activity  . Alcohol use: Yes    Comment: OCC  . Drug use: No  . Sexual activity: Yes    Birth control/protection: Surgical    Comment: tubalization  Lifestyle  . Physical activity    Days per week: Not on file    Minutes per session: Not on file  . Stress: Not on file  Relationships  . Social Herbalist on phone: Not on file    Gets together: Not on file    Attends religious service: Not on file    Active member of club or organization: Not on file    Attends meetings of clubs or organizations: Not on file    Relationship status: Not on file  . Intimate partner violence    Fear of current or ex partner: Not on file    Emotionally abused: Not on file    Physically abused: Not on file    Forced sexual activity: Not on file  Other Topics Concern  . Not on file  Social History Narrative  .  Not on file   The PMH, PSH, Social History, Family History, Medications, and allergies have been reviewed in [facility], and have been updated if relevant.  Review of Systems  Constitutional: Negative.   HENT: Negative.   Eyes: Negative.   Respiratory: Negative.   Cardiovascular: Negative.   Gastrointestinal: Negative.   Musculoskeletal: Negative.   Skin: Negative.   Neurological: Positive for light-headedness. Negative for dizziness, tremors,  seizures, syncope, facial asymmetry, speech difficulty, weakness, numbness and headaches.  Hematological: Negative.   Psychiatric/Behavioral: Negative for dysphoric mood, self-injury, sleep disturbance and suicidal ideas. The patient is not nervous/anxious.   All other systems reviewed and are negative.      Objective:    BP 124/74 (BP Location: Left Arm, Patient Position: Sitting, Cuff Size: Normal)   Pulse 93   Temp 97.9 F (36.6 C) (Oral)   Ht _0  (1.651 m)   Wt 163 lb 9.6 oz (74.2 kg)   LMP 01/30/2019   SpO2 97%   BMI 27.22 kg/m   Wt Readings from Last 3 Encounters:  02/14/19 163 lb 9.6 oz (74.2 kg)  10/08/18 167 lb (75.8 kg)  08/23/18 170 lb 3.2 oz (77.2 kg)    Physical Exam    General:  Well-developed,well-nourished,in no acute distress; alert,appropriate and cooperative throughout examination Head:  normocephalic and atraumatic.   Eyes:  vision grossly intact, PERRL Ears:  R ear normal and L ear normal externally, TMs clear bilaterally Nose:  no external deformity.   Mouth:  good dentition.   Neck:  No deformities, masses, or tenderness noted. Lungs:  Normal respiratory effort, chest expands symmetrically. Lungs are clear to auscultation, no crackles or wheezes. Heart:  Normal rate and regular rhythm. S1 and S2 normal without gallop, murmur, click, rub or other extra sounds. Abdomen:  Bowel sounds positive,abdomen soft and non-tender without masses, organomegaly or hernias noted. Msk:  No deformity or scoliosis noted of thoracic or lumbar spine.   Extremities:  No clubbing, cyanosis, edema, or deformity noted with normal full range of motion of all joints.   Neurologic:  alert & oriented X3 and gait normal.   Skin:  Intact without suspicious lesions or rashes Cervical Nodes:  No lymphadenopathy noted Axillary Nodes:  No palpable lymphadenopathy Psych:  Cognition and judgment appear intact. Alert and cooperative with normal attention span and concentration. No  apparent delusions, illusions, hallucinations

## 2019-02-14 ENCOUNTER — Other Ambulatory Visit: Payer: Self-pay

## 2019-02-14 ENCOUNTER — Ambulatory Visit (INDEPENDENT_AMBULATORY_CARE_PROVIDER_SITE_OTHER): Payer: BC Managed Care – PPO | Admitting: Family Medicine

## 2019-02-14 ENCOUNTER — Encounter: Payer: Self-pay | Admitting: Family Medicine

## 2019-02-14 VITALS — BP 124/74 | HR 93 | Temp 97.9°F | Ht 65.0 in | Wt 163.6 lb

## 2019-02-14 DIAGNOSIS — F419 Anxiety disorder, unspecified: Secondary | ICD-10-CM

## 2019-02-14 DIAGNOSIS — F329 Major depressive disorder, single episode, unspecified: Secondary | ICD-10-CM

## 2019-02-14 DIAGNOSIS — C3491 Malignant neoplasm of unspecified part of right bronchus or lung: Secondary | ICD-10-CM

## 2019-02-14 DIAGNOSIS — Z8249 Family history of ischemic heart disease and other diseases of the circulatory system: Secondary | ICD-10-CM

## 2019-02-14 DIAGNOSIS — Z0000 Encounter for general adult medical examination without abnormal findings: Secondary | ICD-10-CM

## 2019-02-14 DIAGNOSIS — K219 Gastro-esophageal reflux disease without esophagitis: Secondary | ICD-10-CM

## 2019-02-14 DIAGNOSIS — I10 Essential (primary) hypertension: Secondary | ICD-10-CM | POA: Diagnosis not present

## 2019-02-14 DIAGNOSIS — F32A Depression, unspecified: Secondary | ICD-10-CM

## 2019-02-14 MED ORDER — SERTRALINE HCL 100 MG PO TABS: 150.0000 mg | ORAL_TABLET | Freq: Every day | ORAL | 0 refills | Status: DC

## 2019-02-14 NOTE — Patient Instructions (Addendum)
Great to see you! I will call you with your lab results from today and you can view them online.  Please say hi to your beautiful family for me!  Let's decrease your zoloft to 150 mg daily please update me in a few weeks.  Okay to STOP taking lisinopril.  Please check blood pressure at least 3 times a week and if you feel funny at all- do this for the next two weeks, send me my chart updates.

## 2019-02-14 NOTE — Assessment & Plan Note (Signed)
Now that she quit smoking her ascvd risk score is very low!  Check labs today.  The 10-year ASCVD risk score Mikey Bussing DC Brooke Bonito., et al., 2013) is: 0.9%   Values used to calculate the score:     Age: 42 years     Sex: Female     Is Non-Hispanic African American: No     Diabetic: No     Tobacco smoker: No     Systolic Blood Pressure: 791 mmHg     Is BP treated: No     HDL Cholesterol: 33.2 mg/dL     Total Cholesterol: 145 mg/dL Orders Placed This Encounter  Procedures  . CBC with Differential/Platelet  . Comprehensive metabolic panel  . Lipid panel  . TSH

## 2019-02-14 NOTE — Assessment & Plan Note (Signed)
Now with signs and symptoms of orthostasis since she has lost weight and quit smoking.  Has BP cuff at home and pt is very reliable.  She is currently taking lisinopril 10 mg daily and plans on losing more weight.  We agreed on stopping lisinopril (rather than cutting it to 5 mg daily) given these factors and she will check her BP at least 3 times weekly (or if she feels bad) over the next 2 weeks and keep me updated on mychart. The patient indicates understanding of these issues and agrees with the plan.

## 2019-02-14 NOTE — Assessment & Plan Note (Signed)
Improved with 200 mg but now possibly dose is too high as now she feels "numb." We agreed to decrease her zoloft back down to 150 mg daily and she will update me in a few weeks. The patient indicates understanding of these issues and agrees with the plan.

## 2019-02-14 NOTE — Assessment & Plan Note (Signed)
S/p wedge resection and doing well. She has appt scheduled with oncology and follow up CT in 03/2019.

## 2019-02-14 NOTE — Assessment & Plan Note (Signed)
The 10-year ASCVD risk score Mikey Bussing DC Brooke Bonito., et al., 2013) is: 0.9%   Values used to calculate the score:     Age: 43 years     Sex: Female     Is Non-Hispanic African American: No     Diabetic: No     Tobacco smoker: No     Systolic Blood Pressure: 076 mmHg     Is BP treated: No     HDL Cholesterol: 33.2 mg/dL     Total Cholesterol: 145 mg/dL

## 2019-02-14 NOTE — Assessment & Plan Note (Signed)
Reviewed preventive care protocols, scheduled due services, and updated immunizations Discussed nutrition, exercise, diet, and healthy lifestyle.  

## 2019-02-15 LAB — CBC WITH DIFFERENTIAL/PLATELET
Absolute Monocytes: 659 cells/uL (ref 200–950)
Basophils Absolute: 27 cells/uL (ref 0–200)
Basophils Relative: 0.3 %
Eosinophils Absolute: 80 cells/uL (ref 15–500)
Eosinophils Relative: 0.9 %
HCT: 38.9 % (ref 35.0–45.0)
Hemoglobin: 13.2 g/dL (ref 11.7–15.5)
Lymphs Abs: 2679 cells/uL (ref 850–3900)
MCH: 30.7 pg (ref 27.0–33.0)
MCHC: 33.9 g/dL (ref 32.0–36.0)
MCV: 90.5 fL (ref 80.0–100.0)
MPV: 10.5 fL (ref 7.5–12.5)
Monocytes Relative: 7.4 %
Neutro Abs: 5456 cells/uL (ref 1500–7800)
Neutrophils Relative %: 61.3 %
Platelets: 241 10*3/uL (ref 140–400)
RBC: 4.3 10*6/uL (ref 3.80–5.10)
RDW: 11.9 % (ref 11.0–15.0)
Total Lymphocyte: 30.1 %
WBC: 8.9 10*3/uL (ref 3.8–10.8)

## 2019-02-15 LAB — LIPID PANEL
Cholesterol: 201 mg/dL — ABNORMAL HIGH (ref <200)
HDL: 38 mg/dL — ABNORMAL LOW (ref >=50)
LDL Cholesterol (Calc): 129 mg/dL (calc) — ABNORMAL HIGH
Non-HDL Cholesterol (Calc): 163 mg/dL (calc) — ABNORMAL HIGH (ref <130)
Total CHOL/HDL Ratio: 5.3 (calc) — ABNORMAL HIGH (ref <5.0)
Triglycerides: 200 mg/dL — ABNORMAL HIGH (ref <150)

## 2019-02-15 LAB — COMPREHENSIVE METABOLIC PANEL
AG Ratio: 2.4 (calc) (ref 1.0–2.5)
ALT: 16 U/L (ref 6–29)
AST: 16 U/L (ref 10–30)
Albumin: 4.8 g/dL (ref 3.6–5.1)
Alkaline phosphatase (APISO): 37 U/L (ref 31–125)
BUN: 13 mg/dL (ref 7–25)
CO2: 24 mmol/L (ref 20–32)
Calcium: 10 mg/dL (ref 8.6–10.2)
Chloride: 103 mmol/L (ref 98–110)
Creat: 0.74 mg/dL (ref 0.50–1.10)
Globulin: 2 g/dL (calc) (ref 1.9–3.7)
Glucose, Bld: 93 mg/dL (ref 65–99)
Potassium: 3.9 mmol/L (ref 3.5–5.3)
Sodium: 139 mmol/L (ref 135–146)
Total Bilirubin: 0.4 mg/dL (ref 0.2–1.2)
Total Protein: 6.8 g/dL (ref 6.1–8.1)

## 2019-02-15 LAB — TSH: TSH: 2.75 mIU/L

## 2019-03-10 ENCOUNTER — Encounter: Payer: Self-pay | Admitting: Family Medicine

## 2019-04-06 ENCOUNTER — Ambulatory Visit
Admission: RE | Admit: 2019-04-06 | Discharge: 2019-04-06 | Disposition: A | Discharge status: Home / Self Care | Payer: BC Managed Care – PPO | Source: Ambulatory Visit | Attending: Internal Medicine | Admitting: Internal Medicine

## 2019-04-06 ENCOUNTER — Inpatient Hospital Stay: Admission: RE | Admit: 2019-04-06 | Payer: BLUE CROSS/BLUE SHIELD | Source: Ambulatory Visit

## 2019-04-06 ENCOUNTER — Other Ambulatory Visit: Payer: Self-pay

## 2019-04-06 DIAGNOSIS — C3431 Malignant neoplasm of lower lobe, right bronchus or lung: Secondary | ICD-10-CM | POA: Insufficient documentation

## 2019-04-06 DIAGNOSIS — J439 Emphysema, unspecified: Secondary | ICD-10-CM | POA: Diagnosis not present

## 2019-04-06 MED ORDER — IOHEXOL 300 MG/ML  SOLN
75.0000 mL | Freq: Once | INTRAMUSCULAR | Status: AC | PRN
  Administered 2019-04-06: 75 mL via INTRAVENOUS

## 2019-04-06 MED ORDER — IOHEXOL 300 MG/ML  SOLN: 50.0000 mL | Freq: Once | INTRAMUSCULAR | Status: DC | PRN

## 2019-04-07 ENCOUNTER — Other Ambulatory Visit: Payer: Self-pay

## 2019-04-07 ENCOUNTER — Encounter: Payer: Self-pay | Admitting: Internal Medicine

## 2019-04-07 NOTE — Progress Notes (Signed)
Patient stated that she had been doing well with no complaints. Patient would like her CT Scan results.

## 2019-04-08 ENCOUNTER — Inpatient Hospital Stay: Payer: BC Managed Care – PPO | Attending: Internal Medicine | Admitting: Internal Medicine

## 2019-04-08 ENCOUNTER — Other Ambulatory Visit: Payer: Self-pay

## 2019-04-08 DIAGNOSIS — F329 Major depressive disorder, single episode, unspecified: Secondary | ICD-10-CM | POA: Diagnosis not present

## 2019-04-08 DIAGNOSIS — Z8249 Family history of ischemic heart disease and other diseases of the circulatory system: Secondary | ICD-10-CM | POA: Insufficient documentation

## 2019-04-08 DIAGNOSIS — K219 Gastro-esophageal reflux disease without esophagitis: Secondary | ICD-10-CM | POA: Diagnosis not present

## 2019-04-08 DIAGNOSIS — F419 Anxiety disorder, unspecified: Secondary | ICD-10-CM | POA: Insufficient documentation

## 2019-04-08 DIAGNOSIS — Z803 Family history of malignant neoplasm of breast: Secondary | ICD-10-CM | POA: Insufficient documentation

## 2019-04-08 DIAGNOSIS — C3431 Malignant neoplasm of lower lobe, right bronchus or lung: Secondary | ICD-10-CM | POA: Diagnosis not present

## 2019-04-08 DIAGNOSIS — J449 Chronic obstructive pulmonary disease, unspecified: Secondary | ICD-10-CM | POA: Insufficient documentation

## 2019-04-08 DIAGNOSIS — Z823 Family history of stroke: Secondary | ICD-10-CM | POA: Insufficient documentation

## 2019-04-08 DIAGNOSIS — Z87891 Personal history of nicotine dependence: Secondary | ICD-10-CM | POA: Diagnosis not present

## 2019-04-08 DIAGNOSIS — Z79899 Other long term (current) drug therapy: Secondary | ICD-10-CM | POA: Diagnosis not present

## 2019-04-08 DIAGNOSIS — E559 Vitamin D deficiency, unspecified: Secondary | ICD-10-CM | POA: Diagnosis not present

## 2019-04-08 DIAGNOSIS — J439 Emphysema, unspecified: Secondary | ICD-10-CM | POA: Insufficient documentation

## 2019-04-08 DIAGNOSIS — I10 Essential (primary) hypertension: Secondary | ICD-10-CM | POA: Insufficient documentation

## 2019-04-08 NOTE — Assessment & Plan Note (Addendum)
#   Stage I adenocarcinoma the lung/lipidic pattern.  Status post wedge resection clear margins.  CT scan October 2020-negative for any recurrence.  Stable.  Recommend imaging CT scan every 12 months.  #Elevated blood pressure systolic [id number].  Recommend close monitoring of blood pressures if elevated reaching her to PCP regarding management.  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  Recommend if worsening symptoms follow-up with PCP/further evaluation with PFTs etc.  Patient agrees.  # Quit smoking-congratulated on continued smoking cessation.  # vit D def-continue vitamin D.   # DISPOSITION: # 12 m follow up with MD-; CT chest prior- Dr.B   Cc; Dr.Taalia.

## 2019-04-08 NOTE — Progress Notes (Signed)
[facility] NOTE  Patient Care Team: Lucille Passy, MD as PCP - General (Family Medicine) End, Harrell Gave, MD as PCP - Cardiology (Cardiology) Telford Nab, RN as Registered Nurse  CHIEF COMPLAINTS/PURPOSE OF CONSULTATION:  Lung cancer  #  Oncology History Overview Note  #October 2019-RUL Lung adenocarcinoma the lung/lipidic pattern; stage I [pT1aN0]; wedge resection; clear margins; Dr.Oaks.  #Quit smoking [2017]  DIAGNOSIS: Right upper lobe lung cancer  STAGE: 1       ;GOALS: Cure  CURRENT/MOST RECENT THERAPY : Surveillance    Primary cancer of right lower lobe of lung ([place])  04/07/2018 Initial Diagnosis   Primary cancer of right lower lobe of lung ([place])      HISTORY OF PRESENTING ILLNESS:  Catherine Gilbert 43 y.o.  female history of stage I adenocarcinoma the lung is here for follow-up.  Patient denies any worsening shortness of breath or cough.  Denies any chest pain.  Denies any headaches.  Patient denies any blood in stools or black or stools.  Review of Systems  Constitutional: Negative for chills, diaphoresis, fever, malaise/fatigue and weight loss.  HENT: Negative for nosebleeds and sore throat.   Eyes: Negative for double vision.  Respiratory: Negative for cough, hemoptysis, sputum production, shortness of breath and wheezing.   Cardiovascular: Negative for chest pain, palpitations, orthopnea and leg swelling.  Gastrointestinal: Negative for abdominal pain, blood in stool, constipation, diarrhea, heartburn, melena, nausea and vomiting.  Genitourinary: Negative for dysuria, frequency and urgency.  Musculoskeletal: Negative for back pain and joint pain.  Skin: Negative.  Negative for itching and rash.  Neurological: Negative for dizziness, tingling, focal weakness, weakness and headaches.  Endo/Heme/Allergies: Does not bruise/bleed easily.  Psychiatric/Behavioral: Negative for depression. The patient is not nervous/anxious and does not  have insomnia.      MEDICAL HISTORY:  Past Medical History:  Diagnosis Date  . Abnormal Pap smear   . Anemia   . Anxiety   . Anxiety state 04/23/2011  . Cancer ([place]) 02/2018   lung rt  . Depression   . Dysmenorrhea 08/27/2011  . Dyspnea on exertion 10/30/2015  . Essential hypertension 09/15/2012  . Family history of ASCVD (arteriosclerotic cardiovascular disease) 12/31/2016  . Family history of premature CAD 11/11/2016  . GAD (generalized anxiety disorder) 10/30/2015  . GERD (gastroesophageal reflux disease) 05/29/2014  . Headache 01/01/2017  . HSV (herpes simplex virus) infection   . Hypertension   . Initiation of Depo Provera 09/24/2011  . Lung nodule    RLL on coronary calcium score CT  . Migraine headache     SURGICAL HISTORY: Past Surgical History:  Procedure Laterality Date  . BREAST CYST ASPIRATION Right 02/10/2018   neg  . CESAREAN SECTION     x 3   . COLPOSCOPY  2010  . DILATION AND CURETTAGE OF UTERUS    . FLEXIBLE BRONCHOSCOPY N/A 03/29/2018   Procedure: PREOP  BRONCHOSCOPY;  Surgeon: Nestor Lewandowsky, MD;  Location: [facility] ORS;  Service: Thoracic;  Laterality: N/A;  . THORACOTOMY Right 03/29/2018   Procedure: THORACOTOMY MAJOR- POSSIBLE LOBECTOMY;  Surgeon: Nestor Lewandowsky, MD;  Location: [facility] ORS;  Service: Thoracic;  Laterality: Right;  . TUBAL LIGATION    . VIDEO ASSISTED THORACOSCOPY (VATS)/WEDGE RESECTION Right 03/29/2018   Procedure: VIDEO ASSISTED THORACOSCOPY (VATS)/WEDGE RESECTION;  Surgeon: Nestor Lewandowsky, MD;  Location: [facility] ORS;  Service: Thoracic;  Laterality: Right;    SOCIAL HISTORY: lives in [place]; 3 childeresn; quit smoking; in may 2017 [ since 15 until  1ppd].  Social History   Socioeconomic History  . Marital status: Married    Spouse name: Not on file  . Number of children: Not on file  . Years of education: Not on file  . Highest education level: Not on file  Occupational History  . Not on file  Social Needs  . Financial resource strain: Not on  file  . Food insecurity    Worry: Not on file    Inability: Not on file  . Transportation needs    Medical: Not on file    Non-medical: Not on file  Tobacco Use  . Smoking status: Former Smoker    Packs/day: 1.00    Years: 25.00    Pack years: 25.00    Types: Cigarettes    Quit date: 11/22/2015    Years since quitting: 3.3  . Smokeless tobacco: Never Used  Substance and Sexual Activity  . Alcohol use: Yes    Comment: OCC  . Drug use: No  . Sexual activity: Yes    Birth control/protection: Surgical    Comment: tubalization  Lifestyle  . Physical activity    Days per week: Not on file    Minutes per session: Not on file  . Stress: Not on file  Relationships  . Social Herbalist on phone: Not on file    Gets together: Not on file    Attends religious service: Not on file    Active member of club or organization: Not on file    Attends meetings of clubs or organizations: Not on file    Relationship status: Not on file  . Intimate partner violence    Fear of current or ex partner: Not on file    Emotionally abused: Not on file    Physically abused: Not on file    Forced sexual activity: Not on file  Other Topics Concern  . Not on file  Social History Narrative  . Not on file    FAMILY HISTORY: Family History  Problem Relation Age of Onset  . Stroke Mother 72       massive stroke  . Heart disease Father 48       congestive heart failure  . Hypertension Father   . Heart disease Brother 23       Suspected MI and SCD  . Heart disease Sister   . Stroke Sister   . Breast cancer Maternal Grandmother 61  . Breast cancer Cousin        x2 paternal    ALLERGIES:  has No Known Allergies.  MEDICATIONS:  Current Outpatient Medications  Medication Sig Dispense Refill  . ALPRAZolam (XANAX) 0.5 MG tablet Take 1 tablet (0.5 mg total) by mouth at bedtime as needed. 30 tablet 5  . ferrous sulfate (FEROSUL) 325 (65 FE) MG tablet TAKE 1 TABLET(325 MG) BY MOUTH  TWICE DAILY WITH A MEAL 180 tablet 3  . Multiple Vitamin (MULTIVITAMIN WITH MINERALS) TABS tablet Take 2 tablets by mouth daily. Juice Plus fruit and vegetables    . omeprazole (PRILOSEC) 40 MG capsule TAKE 1 CAPSULE(40 MG) BY MOUTH EVERY MORNING 90 capsule 3  . sertraline (ZOLOFT) 100 MG tablet Take 1.5 tablets (150 mg total) by mouth daily. 135 tablet 0  . valACYclovir (VALTREX) 500 MG tablet TAKE 2 TABLETS BY MOUTH TODAY, THEN 2 TABLETS TWICE DAILY AS NEEDED. 60 tablet 0  . Aspirin-Caffeine (BC FAST PAIN RELIEF PO) Take 1 packet by mouth daily as needed (headache).    Marland Kitchen  Phenylephrine-APAP-guaiFENesin (TYLENOL SINUS SEVERE PO) Take 2 tablets by mouth daily as needed (sinus headaches).     No current facility-administered medications for this visit.       Marland Kitchen  PHYSICAL EXAMINATION: ECOG PERFORMANCE STATUS: 0 - Asymptomatic  Vitals:   04/08/19 1012 04/08/19 1046  BP: (!) 180/102 (!) 174/104  Pulse: 72   Resp: 18   Temp: (!) 97 F (36.1 C)    Filed Weights   04/08/19 1012  Weight: 163 lb (73.9 kg)    Physical Exam  Constitutional: She is oriented to person, place, and time and well-developed, well-nourished, and in no distress.  HENT:  Head: Normocephalic and atraumatic.  Mouth/Throat: Oropharynx is clear and moist. No oropharyngeal exudate.  Eyes: Pupils are equal, round, and reactive to light.  Neck: Normal range of motion. Neck supple.  Cardiovascular: Normal rate and regular rhythm.  Pulmonary/Chest: No respiratory distress. She has no wheezes.  Abdominal: Soft. Bowel sounds are normal. She exhibits no distension and no mass. There is no abdominal tenderness. There is no rebound and no guarding.  Musculoskeletal: Normal range of motion.        General: No tenderness or edema.  Neurological: She is alert and oriented to person, place, and time.  Skin: Skin is warm.  Psychiatric: Affect normal.     LABORATORY DATA:  I have reviewed the data as listed Lab Results   Component Value Date   WBC 8.9 02/14/2019   HGB 13.2 02/14/2019   HCT 38.9 02/14/2019   MCV 90.5 02/14/2019   PLT 241 02/14/2019   Recent Labs    11/19/18 1352 02/14/19 1352  NA 137 139  K 3.9 3.9  CL 100 103  CO2 28 24  GLUCOSE 89 93  BUN 11 13  CREATININE 0.82 0.74  CALCIUM 9.8 10.0  PROT 7.0 6.8  AST 16 16  ALT 16 16  BILITOT 0.5 0.4    RADIOGRAPHIC STUDIES: I have personally reviewed the radiological images as listed and agreed with the findings in the report. Ct Chest W Contrast  Result Date: 04/06/2019 CLINICAL DATA:  Right lower lobe lung cancer diagnosed in 2019. Right lower lobe wedge resection on 03/29/2018. Asymptomatic. Ex-smoker, quitting 3 years ago. EXAM: CT CHEST WITH CONTRAST TECHNIQUE: Multidetector CT imaging of the chest was performed during intravenous contrast administration. CONTRAST:  36m OMNIPAQUE IOHEXOL 300 MG/ML  SOLN COMPARISON:  08/23/2018 chest radiograph. Most recent CT 02/15/2018 FINDINGS: Cardiovascular: Normal caliber of the aorta and branch vessels. Normal heart size, without pericardial effusion. No central pulmonary embolism, on this non-dedicated study. Mediastinum/Nodes: No supraclavicular adenopathy. No mediastinal or hilar adenopathy. Lungs/Pleura: No pleural fluid. Mild centrilobular emphysema. Status post right lower lobe wedge resection, without residual or locally recurrent disease. Clear left lung. Upper Abdomen: Normal imaged portions of the liver, spleen, stomach, pancreas, gallbladder, adrenal glands, kidneys. No intrahepatic biliary duct dilatation. Common duct is mildly prominent for age at 7 mm on 154/2. This is similar back to 02/05/2018, favored to be within normal variation. Musculoskeletal: No acute osseous abnormality. IMPRESSION: Status post right lower lobe wedge resection, without recurrent or metastatic disease. Emphysema (ICD10-J43.9). Electronically Signed   By: KAbigail MiyamotoM.D.   On: 04/06/2019 08:48    ASSESSMENT &  PLAN:   [place] Stage I adenocarcinoma the lung/lipidic pattern.  Status post wedge resection clear margins.  CT scan October 2020-negative for any recurrence.  Stable.  Recommend imaging CT scan  every 12 months.  #Elevated blood pressure systolic [id number].  Recommend close monitoring of blood pressures if elevated reaching her to PCP regarding management.  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  Recommend if worsening symptoms follow-up with PCP/further evaluation with PFTs etc.  Patient agrees.  # Quit smoking-congratulated on continued smoking cessation.  # vit D def-continue vitamin D.   # DISPOSITION: # 12 m follow up with MD-; CT chest prior- Dr.B   Cc; Dr.Taalia.    All questions were answered. The patient knows to call the clinic with any problems, questions or concerns.   Cammie Sickle, MD 04/08/2019 11:19 AM

## 2019-04-12 ENCOUNTER — Encounter: Payer: Self-pay | Admitting: Family Medicine

## 2019-04-13 ENCOUNTER — Other Ambulatory Visit: Payer: Self-pay

## 2019-04-13 ENCOUNTER — Ambulatory Visit: Payer: Self-pay | Admitting: *Deleted

## 2019-04-13 ENCOUNTER — Ambulatory Visit (INDEPENDENT_AMBULATORY_CARE_PROVIDER_SITE_OTHER): Payer: BC Managed Care – PPO | Admitting: Family Medicine

## 2019-04-13 ENCOUNTER — Encounter: Payer: Self-pay | Admitting: Family Medicine

## 2019-04-13 VITALS — BP 111/88 | HR 79 | Temp 98.3°F | Ht 65.0 in | Wt 165.6 lb

## 2019-04-13 DIAGNOSIS — R531 Weakness: Secondary | ICD-10-CM | POA: Diagnosis not present

## 2019-04-13 DIAGNOSIS — I10 Essential (primary) hypertension: Secondary | ICD-10-CM | POA: Diagnosis not present

## 2019-04-13 DIAGNOSIS — E559 Vitamin D deficiency, unspecified: Secondary | ICD-10-CM

## 2019-04-13 DIAGNOSIS — C3431 Malignant neoplasm of lower lobe, right bronchus or lung: Secondary | ICD-10-CM | POA: Diagnosis not present

## 2019-04-13 LAB — COMPREHENSIVE METABOLIC PANEL WITH GFR
ALT: 22 U/L (ref 0–35)
AST: 20 U/L (ref 0–37)
Albumin: 4.9 g/dL (ref 3.5–5.2)
Alkaline Phosphatase: 41 U/L (ref 39–117)
BUN: 14 mg/dL (ref 6–23)
CO2: 27 meq/L (ref 19–32)
Calcium: 10.1 mg/dL (ref 8.4–10.5)
Chloride: 102 meq/L (ref 96–112)
Creatinine, Ser: 0.85 mg/dL (ref 0.40–1.20)
GFR: 72.67 mL/min
Glucose, Bld: 86 mg/dL (ref 70–99)
Potassium: 4.8 meq/L (ref 3.5–5.1)
Sodium: 138 meq/L (ref 135–145)
Total Bilirubin: 0.6 mg/dL (ref 0.2–1.2)
Total Protein: 7.5 g/dL (ref 6.0–8.3)

## 2019-04-13 LAB — CBC WITH DIFFERENTIAL/PLATELET
Basophils Absolute: 0 10*3/uL (ref 0.0–0.1)
Basophils Relative: 0.3 % (ref 0.0–3.0)
Eosinophils Absolute: 0.1 10*3/uL (ref 0.0–0.7)
Eosinophils Relative: 1.4 % (ref 0.0–5.0)
HCT: 40.8 % (ref 36.0–46.0)
Hemoglobin: 13.6 g/dL (ref 12.0–15.0)
Lymphocytes Relative: 27.8 % (ref 12.0–46.0)
Lymphs Abs: 2.4 10*3/uL (ref 0.7–4.0)
MCHC: 33.4 g/dL (ref 30.0–36.0)
MCV: 88.6 fl (ref 78.0–100.0)
Monocytes Absolute: 0.9 10*3/uL (ref 0.1–1.0)
Monocytes Relative: 10 % (ref 3.0–12.0)
Neutro Abs: 5.3 10*3/uL (ref 1.4–7.7)
Neutrophils Relative %: 60.5 % (ref 43.0–77.0)
Platelets: 223 10*3/uL (ref 150.0–400.0)
RBC: 4.6 Mil/uL (ref 3.87–5.11)
RDW: 14.2 % (ref 11.5–15.5)
WBC: 8.8 10*3/uL (ref 4.0–10.5)

## 2019-04-13 LAB — FERRITIN: Ferritin: 32.9 ng/mL (ref 10.0–291.0)

## 2019-04-13 LAB — TSH: TSH: 3.17 u[IU]/mL (ref 0.35–4.50)

## 2019-04-13 LAB — VITAMIN D 25 HYDROXY (VIT D DEFICIENCY, FRACTURES): VITD: 34.57 ng/mL (ref 30.00–100.00)

## 2019-04-13 MED ORDER — LISINOPRIL 10 MG PO TABS: 5.0000 mg | ORAL_TABLET | Freq: Every day | ORAL | Status: DC

## 2019-04-13 NOTE — Telephone Encounter (Signed)
B/P earlier this morning 130/101. Has had elevated readings for 6 days now. Recheck now, 137/101  HR 81 with a mild headache. Stated she was taken off lisinopril in September. Started taking it again last Friday when she got a high b/p reading at the oncologist office. Reading have remained high since Friday while on antihypertensive. Reported she felt weak in her muscles yesterday but none so far today. Denies CP/SOB/Blurred vision/one-sided weakness/numbness. Reviewed urgent symptoms that would need immediate evaluation. No fever/no contacts/no travels. Warm transferred for appointment.  Reason for Disposition . [6] Systolic BP  >= 010 OR Diastolic >= 932  AND [3] having NO cardiac or neurologic symptoms  Answer Assessment - Initial Assessment Questions 1. BLOOD PRESSURE: "What is the blood pressure?" "Did you take at least two measurements 5 minutes apart?"     This morning 130/101, last night 141/98. 2. ONSET: "When did you take your blood pressure?"    Friday at the cancer center. 3. HOW: "How did you obtain the blood pressure?" (e.g., visiting nurse, automatic home BP monitor)     Home cuff 4. HISTORY: "Do you have a history of high blood pressure?"    yes 5. MEDICATIONS: "Are you taking any medications for blood pressure?" "Have you missed any doses recently?"     Started back taking blood pressure medication on Friday. 6. OTHER SYMPTOMS: "Do you have any symptoms?" (e.g., headache, chest pain, blurred vision, difficulty breathing, weakness)     Headache on Friday and Saturday.vision feels off today. Weakness with muscles at work yesterday. No SOB. 7. PREGNANCY: "Is there any chance you are pregnant?" "When was your last menstrual period?"     no  Protocols used: HIGH BLOOD PRESSURE-A-AH

## 2019-04-13 NOTE — Assessment & Plan Note (Signed)
>  25 minutes spent in face to face time with patient, >50% spent in counselling or coordination of care.  She is feeling weak today and her BP is quite low.  I question if her BP was elevated just from anxiety about her CT results.  We agreed to have her cut back lisinopril to 5 mg daily as hypotension could be causing weakness and dizziness.   She will check her BP 3 times weekly or she feels bad and will keep me updated. Labs today to rule out other contributing factors. Also discussed DASH diet. Orders Placed This Encounter  Procedures  . TSH  . Comprehensive metabolic panel  . CBC with Differential/Platelet  . Ferritin  . Vitamin D (25 hydroxy)   The patient indicates understanding of these issues and agrees with the plan.

## 2019-04-13 NOTE — Progress Notes (Signed)
Subjective:   Patient ID: Catherine Gilbert, female    DOB: 06/18/1976, 43 y.o.   MRN: [id number]  Catherine Gilbert is a pleasant 43 y.o. year old female who presents to clinic today with BP Issues (Pt is here today C/O BP issues.  Pt explains that her bp has been elevated since Friday.  Pt started keeping track since last night.  Pt feeling weak, and has been having headaches.  Pt does not get the flu shot.)  on 04/13/2019  HPI:  HTN- she is currently taking lisinopril 10 mg daily- restarted it last week.  BP Readings from Last 3 Encounters:  04/13/19 111/88  04/08/19 (!) 174/104  02/14/19 124/74   Lab Results  Component Value Date   CREATININE 0.74 02/14/2019   This morning, her blood pressure was 130/101 and she has had elevated readings for a week now.  She waken off of lisinopril in September.  She restarted taking it again last Friday when it was high in her oncologist's office.  Denies Ha, blurred vision, CP or SOB.  Wt Readings from Last 3 Encounters:  04/13/19 165 lb 9.6 oz (75.1 kg)  04/08/19 163 lb (73.9 kg)  02/14/19 163 lb 9.6 oz (74.2 kg)   She has changed the smoothies she drinks.  She was worried about her CT scan results which turned out great and wonders if it was just from stress/anxiety.  Ct Chest W Contrast  Result Date: 04/06/2019 CLINICAL DATA:  Right lower lobe lung cancer diagnosed in 2019. Right lower lobe wedge resection on 03/29/2018. Asymptomatic. Ex-smoker, quitting 3 years ago. EXAM: CT CHEST WITH CONTRAST TECHNIQUE: Multidetector CT imaging of the chest was performed during intravenous contrast administration. CONTRAST:  46m OMNIPAQUE IOHEXOL 300 MG/ML  SOLN COMPARISON:  08/23/2018 chest radiograph. Most recent CT 02/15/2018 FINDINGS: Cardiovascular: Normal caliber of the aorta and branch vessels. Normal heart size, without pericardial effusion. No central pulmonary embolism, on this non-dedicated study. Mediastinum/Nodes: No supraclavicular adenopathy.  No mediastinal or hilar adenopathy. Lungs/Pleura: No pleural fluid. Mild centrilobular emphysema. Status post right lower lobe wedge resection, without residual or locally recurrent disease. Clear left lung. Upper Abdomen: Normal imaged portions of the liver, spleen, stomach, pancreas, gallbladder, adrenal glands, kidneys. No intrahepatic biliary duct dilatation. Common duct is mildly prominent for age at 7 mm on 154/2. This is similar back to 02/05/2018, favored to be within normal variation. Musculoskeletal: No acute osseous abnormality. IMPRESSION: Status post right lower lobe wedge resection, without recurrent or metastatic disease. Emphysema (ICD10-J43.9). Electronically Signed   By: KAbigail MiyamotoM.D.   On: 04/06/2019 08:48     Lab Results  Component Value Date   VD25OH 27 (L) 11/19/2018    Lab Results  Component Value Date   TSH 2.75 02/14/2019   Lab Results  Component Value Date   ALT 16 02/14/2019   AST 16 02/14/2019   ALKPHOS 34 (L) 03/23/2018   BILITOT 0.4 02/14/2019   Lab Results  Component Value Date   WBC 8.9 02/14/2019   HGB 13.2 02/14/2019   HCT 38.9 02/14/2019   MCV 90.5 02/14/2019   PLT 241 02/14/2019   Lab Results  Component Value Date   VITAMINB12 1,237 (H) 11/19/2018     Current Outpatient Medications on File Prior to Visit  Medication Sig Dispense Refill  . ALPRAZolam (XANAX) 0.5 MG tablet Take 1 tablet (0.5 mg total) by mouth at bedtime as needed. 30 tablet 5  . Aspirin-Caffeine (BC FAST PAIN RELIEF PO)  Take 1 packet by mouth daily as needed (headache).    . ferrous sulfate (FEROSUL) 325 (65 FE) MG tablet TAKE 1 TABLET(325 MG) BY MOUTH TWICE DAILY WITH A MEAL 180 tablet 3  . lisinopril (ZESTRIL) 10 MG tablet Take 10 mg by mouth daily.    . Multiple Vitamin (MULTIVITAMIN WITH MINERALS) TABS tablet Take 2 tablets by mouth daily. Juice Plus fruit and vegetables    . omeprazole (PRILOSEC) 40 MG capsule TAKE 1 CAPSULE(40 MG) BY MOUTH EVERY MORNING 90 capsule  3  . Phenylephrine-APAP-guaiFENesin (TYLENOL SINUS SEVERE PO) Take 2 tablets by mouth daily as needed (sinus headaches).    . sertraline (ZOLOFT) 100 MG tablet Take 1.5 tablets (150 mg total) by mouth daily. 135 tablet 0  . valACYclovir (VALTREX) 500 MG tablet TAKE 2 TABLETS BY MOUTH TODAY, THEN 2 TABLETS TWICE DAILY AS NEEDED. 60 tablet 0   No current facility-administered medications on file prior to visit.     No Known Allergies  Past Medical History:  Diagnosis Date  . Abnormal Pap smear   . Anemia   . Anxiety   . Anxiety state 04/23/2011  . Cancer ([place]) 02/2018   lung rt  . Depression   . Dysmenorrhea 08/27/2011  . Dyspnea on exertion 10/30/2015  . Essential hypertension 09/15/2012  . Family history of ASCVD (arteriosclerotic cardiovascular disease) 12/31/2016  . Family history of premature CAD 11/11/2016  . GAD (generalized anxiety disorder) 10/30/2015  . GERD (gastroesophageal reflux disease) 05/29/2014  . Headache 01/01/2017  . HSV (herpes simplex virus) infection   . Hypertension   . Initiation of Depo Provera 09/24/2011  . Lung nodule    RLL on coronary calcium score CT  . Migraine headache     Past Surgical History:  Procedure Laterality Date  . BREAST CYST ASPIRATION Right 02/10/2018   neg  . CESAREAN SECTION     x 3   . COLPOSCOPY  2010  . DILATION AND CURETTAGE OF UTERUS    . FLEXIBLE BRONCHOSCOPY N/A 03/29/2018   Procedure: PREOP  BRONCHOSCOPY;  Surgeon: Nestor Lewandowsky, MD;  Location: [facility] ORS;  Service: Thoracic;  Laterality: N/A;  . THORACOTOMY Right 03/29/2018   Procedure: THORACOTOMY MAJOR- POSSIBLE LOBECTOMY;  Surgeon: Nestor Lewandowsky, MD;  Location: [facility] ORS;  Service: Thoracic;  Laterality: Right;  . TUBAL LIGATION    . VIDEO ASSISTED THORACOSCOPY (VATS)/WEDGE RESECTION Right 03/29/2018   Procedure: VIDEO ASSISTED THORACOSCOPY (VATS)/WEDGE RESECTION;  Surgeon: Nestor Lewandowsky, MD;  Location: [facility] ORS;  Service: Thoracic;  Laterality: Right;    Family History   Problem Relation Age of Onset  . Stroke Mother 75       massive stroke  . Heart disease Father 45       congestive heart failure  . Hypertension Father   . Heart disease Brother 99       Suspected MI and SCD  . Heart disease Sister   . Stroke Sister   . Breast cancer Maternal Grandmother 66  . Breast cancer Cousin        x2 paternal    Social History   Socioeconomic History  . Marital status: Married    Spouse name: Not on file  . Number of children: Not on file  . Years of education: Not on file  . Highest education level: Not on file  Occupational History  . Not on file  Social Needs  . Financial resource strain: Not on file  . Food insecurity    Worry:  Not on file    Inability: Not on file  . Transportation needs    Medical: Not on file    Non-medical: Not on file  Tobacco Use  . Smoking status: Former Smoker    Packs/day: 1.00    Years: 25.00    Pack years: 25.00    Types: Cigarettes    Quit date: 11/22/2015    Years since quitting: 3.3  . Smokeless tobacco: Never Used  Substance and Sexual Activity  . Alcohol use: Yes    Comment: OCC  . Drug use: No  . Sexual activity: Yes    Birth control/protection: Surgical    Comment: tubalization  Lifestyle  . Physical activity    Days per week: Not on file    Minutes per session: Not on file  . Stress: Not on file  Relationships  . Social Herbalist on phone: Not on file    Gets together: Not on file    Attends religious service: Not on file    Active member of club or organization: Not on file    Attends meetings of clubs or organizations: Not on file    Relationship status: Not on file  . Intimate partner violence    Fear of current or ex partner: Not on file    Emotionally abused: Not on file    Physically abused: Not on file    Forced sexual activity: Not on file  Other Topics Concern  . Not on file  Social History Narrative  . Not on file   The PMH, PSH, Social History, Family History,  Medications, and allergies have been reviewed in [facility], and have been updated if relevant.   Review of Systems  Constitutional: Negative.   HENT: Negative.   Eyes: Negative.   Respiratory: Negative.   Cardiovascular: Negative.   Gastrointestinal: Negative.   Endocrine: Negative.   Genitourinary: Negative.   Musculoskeletal: Negative.   Allergic/Immunologic: Negative.   Neurological: Positive for headaches. Negative for dizziness, tremors, seizures, syncope, facial asymmetry, speech difficulty, weakness, light-headedness and numbness.  Hematological: Negative.   Psychiatric/Behavioral: Negative.   All other systems reviewed and are negative.      Objective:    BP 111/88 (BP Location: Left Arm, Patient Position: Sitting, Cuff Size: Normal)   Pulse 79   Temp 98.3 F (36.8 C) (Oral)   Ht _0  (1.651 m)   Wt 165 lb 9.6 oz (75.1 kg)   SpO2 97%   BMI 27.56 kg/m    Physical Exam        Assessment & Plan:   Essential hypertension No follow-ups on file.

## 2019-04-13 NOTE — Patient Instructions (Addendum)
Great to see you.  Let's decrease your lisinopril 5 mg daily.  OMRON is my favorite blood pressure cuff.  Check your blood pressure with the new cuff and check blood pressure three times a week and keep me updated.    DASH Eating Plan DASH stands for "Dietary Approaches to Stop Hypertension." The DASH eating plan is a healthy eating plan that has been shown to reduce high blood pressure (hypertension). It may also reduce your risk for type 2 diabetes, heart disease, and stroke. The DASH eating plan may also help with weight loss. What are tips for following this plan?  General guidelines  Avoid eating more than 2,300 mg (milligrams) of salt (sodium) a day. If you have hypertension, you may need to reduce your sodium intake to 1,500 mg a day.  Limit alcohol intake to no more than 1 drink a day for nonpregnant women and 2 drinks a day for men. One drink equals 12 oz of beer, 5 oz of wine, or 1 oz of hard liquor.  Work with your health care provider to maintain a healthy body weight or to lose weight. Ask what an ideal weight is for you.  Get at least 30 minutes of exercise that causes your heart to beat faster (aerobic exercise) most days of the week. Activities may include walking, swimming, or biking.  Work with your health care provider or diet and nutrition specialist (dietitian) to adjust your eating plan to your individual calorie needs. Reading food labels   Check food labels for the amount of sodium per serving. Choose foods with less than 5 percent of the Daily Value of sodium. Generally, foods with less than 300 mg of sodium per serving fit into this eating plan.  To find whole grains, look for the word "whole" as the first word in the ingredient list. Shopping  Buy products labeled as "low-sodium" or "no salt added."  Buy fresh foods. Avoid canned foods and premade or frozen meals. Cooking  Avoid adding salt when cooking. Use salt-free seasonings or herbs instead of  table salt or sea salt. Check with your health care provider or pharmacist before using salt substitutes.  Do not fry foods. Cook foods using healthy methods such as baking, boiling, grilling, and broiling instead.  Cook with heart-healthy oils, such as olive, canola, soybean, or sunflower oil. Meal planning  Eat a balanced diet that includes: ? 5 or more servings of fruits and vegetables each day. At each meal, try to fill half of your plate with fruits and vegetables. ? Up to 6-8 servings of whole grains each day. ? Less than 6 oz of lean meat, poultry, or fish each day. A 3-oz serving of meat is about the same size as a deck of cards. One egg equals 1 oz. ? 2 servings of low-fat dairy each day. ? A serving of nuts, seeds, or beans 5 times each week. ? Heart-healthy fats. Healthy fats called Omega-3 fatty acids are found in foods such as flaxseeds and coldwater fish, like sardines, salmon, and mackerel.  Limit how much you eat of the following: ? Canned or prepackaged foods. ? Food that is high in trans fat, such as fried foods. ? Food that is high in saturated fat, such as fatty meat. ? Sweets, desserts, sugary drinks, and other foods with added sugar. ? Full-fat dairy products.  Do not salt foods before eating.  Try to eat at least 2 vegetarian meals each week.  Eat more home-cooked food and  less restaurant, buffet, and fast food.  When eating at a restaurant, ask that your food be prepared with less salt or no salt, if possible. What foods are recommended? The items listed may not be a complete list. Talk with your dietitian about what dietary choices are best for you. Grains Whole-grain or whole-wheat bread. Whole-grain or whole-wheat pasta. Brown rice. [place]. Bulgur. Whole-grain and low-sodium cereals. Pita bread. Low-fat, low-sodium crackers. Whole-wheat flour tortillas. Vegetables Fresh or frozen vegetables (raw, steamed, roasted, or grilled). Low-sodium or  reduced-sodium tomato and vegetable juice. Low-sodium or reduced-sodium tomato sauce and tomato paste. Low-sodium or reduced-sodium canned vegetables. Fruits All fresh, dried, or frozen fruit. Canned fruit in natural juice (without added sugar). Meat and other protein foods Skinless chicken or [place]. Ground chicken or [place]. Pork with fat trimmed off. Fish and seafood. Egg whites. Dried beans, peas, or lentils. Unsalted nuts, nut butters, and seeds. Unsalted canned beans. Lean cuts of beef with fat trimmed off. Low-sodium, lean deli meat. Dairy Low-fat (1%) or fat-free (skim) milk. Fat-free, low-fat, or reduced-fat cheeses. Nonfat, low-sodium ricotta or cottage cheese. Low-fat or nonfat yogurt. Low-fat, low-sodium cheese. Fats and oils Soft margarine without trans fats. Vegetable oil. Low-fat, reduced-fat, or light mayonnaise and salad dressings (reduced-sodium). Canola, safflower, olive, soybean, and sunflower oils. Avocado. Seasoning and other foods Herbs. Spices. Seasoning mixes without salt. Unsalted popcorn and pretzels. Fat-free sweets. What foods are not recommended? The items listed may not be a complete list. Talk with your dietitian about what dietary choices are best for you. Grains Baked goods made with fat, such as croissants, muffins, or some breads. Dry pasta or rice meal packs. Vegetables Creamed or fried vegetables. Vegetables in a cheese sauce. Regular canned vegetables (not low-sodium or reduced-sodium). Regular canned tomato sauce and paste (not low-sodium or reduced-sodium). Regular tomato and vegetable juice (not low-sodium or reduced-sodium). Angie Fava. Olives. Fruits Canned fruit in a light or heavy syrup. Fried fruit. Fruit in cream or butter sauce. Meat and other protein foods Fatty cuts of meat. Ribs. Fried meat. Berniece Salines. Sausage. Bologna and other processed lunch meats. Salami. Fatback. Hotdogs. Bratwurst. Salted nuts and seeds. Canned beans with added salt. Canned or  smoked fish. Whole eggs or egg yolks. Chicken or [place] with skin. Dairy Whole or 2% milk, cream, and half-and-half. Whole or full-fat cream cheese. Whole-fat or sweetened yogurt. Full-fat cheese. Nondairy creamers. Whipped toppings. Processed cheese and cheese spreads. Fats and oils Butter. Stick margarine. Lard. Shortening. Ghee. Bacon fat. Tropical oils, such as coconut, palm kernel, or palm oil. Seasoning and other foods Salted popcorn and pretzels. Onion salt, garlic salt, seasoned salt, table salt, and sea salt. Worcestershire sauce. Tartar sauce. Barbecue sauce. Teriyaki sauce. Soy sauce, including reduced-sodium. Steak sauce. Canned and packaged gravies. Fish sauce. Oyster sauce. Cocktail sauce. Horseradish that you find on the shelf. Ketchup. Mustard. Meat flavorings and tenderizers. Bouillon cubes. Hot sauce and Tabasco sauce. Premade or packaged marinades. Premade or packaged taco seasonings. Relishes. Regular salad dressings. Where to find more information:  National Heart, Lung, and [place]: https://[place]-eaton.com/  American Heart Association: www.heart.org Summary  The DASH eating plan is a healthy eating plan that has been shown to reduce high blood pressure (hypertension). It may also reduce your risk for type 2 diabetes, heart disease, and stroke.  With the DASH eating plan, you should limit salt (sodium) intake to 2,300 mg a day. If you have hypertension, you may need to reduce your sodium intake to 1,500 mg a day.  When on the DASH eating plan, aim to eat more fresh fruits and vegetables, whole grains, lean proteins, low-fat dairy, and heart-healthy fats.  Work with your health care provider or diet and nutrition specialist (dietitian) to adjust your eating plan to your individual calorie needs. This information is not intended to replace advice given to you by your health care provider. Make sure you discuss any questions you have with your health care provider. Document  Released: 06/05/2011 Document Revised: 05/29/2017 Document Reviewed: 06/09/2016 Elsevier Patient Education  2020 [employer].

## 2019-04-14 NOTE — Telephone Encounter (Signed)
Pt saw pcp yesterday.

## 2019-04-20 ENCOUNTER — Encounter: Payer: Self-pay | Admitting: Family Medicine

## 2019-04-20 NOTE — Telephone Encounter (Signed)
Dr. Deborra Medina pt send another message follow up on her e-mail about the BP yesterday. Please advise.   "Good morning!! I've got to be quick so right to the point. I sent you an email Monday morning with my blood pressure for the last few days, did you receive that? Also every day it is still been fairly high in my opinion. This morning it was 149/105. After I get home I can tell you what it was yesterday.  Thank you and talk to you soon! Thank you and talk to you soon!  Kayleen Memos"

## 2019-04-22 ENCOUNTER — Other Ambulatory Visit: Payer: Self-pay | Admitting: Family Medicine

## 2019-05-08 ENCOUNTER — Other Ambulatory Visit: Payer: Self-pay | Admitting: Family Medicine

## 2019-05-08 MED ORDER — VALACYCLOVIR HCL 500 MG PO TABS: ORAL_TABLET | ORAL | 0 refills | Status: DC

## 2019-05-25 DIAGNOSIS — Z20828 Contact with and (suspected) exposure to other viral communicable diseases: Secondary | ICD-10-CM | POA: Diagnosis not present

## 2019-06-18 ENCOUNTER — Other Ambulatory Visit: Payer: Self-pay | Admitting: Family Medicine

## 2019-06-20 NOTE — Telephone Encounter (Signed)
Last OV 04/13/19 Last fill 09/29/18  #30/5

## 2019-07-25 NOTE — Assessment & Plan Note (Signed)
History:  We decreased her dose of Lisinopril to 5 mg daily to symptomatic hypotension (weakness and dizziness) in October.  She sent the following mychart message the following week:  Good morning!! I've got to be quick so right to the point. I sent you an email Monday morning with my blood pressure for the last few days, did you receive that? Also every day it is still been fairly high in my opinion. This morning it was 149/105. After I get home I can tell you what it was yesterday.  Thank you and talk to you soon!  Catherine Gilbert  Review: taking medications as instructed, no medication side effects noted, no TIAs, no chest pain on exertion, no dyspnea on exertion, no swelling of ankles. Smoker: No.  BP Readings from Last 3 Encounters:  04/13/19 111/88  04/08/19 (!) 174/104  02/14/19 124/74   Lab Results  Component Value Date   CREATININE 0.85 04/13/2019     Assessment/Plan: 1. Medication: no change. 2. Dietary sodium restriction. 3. Regular aerobic exercise.

## 2019-07-25 NOTE — Progress Notes (Unsigned)
Virtual Visit via Video   Due to the COVID-19 pandemic, this visit was completed with telemedicine (audio/video) technology to reduce patient and provider exposure as well as to preserve personal protective equipment.   I connected with Catherine Gilbert by a video enabled telemedicine application and verified that I am speaking with the correct person using two identifiers. Location patient: Home Location provider: [facility], Office Persons participating in the virtual visit: Haze Rushing, MD   I discussed the limitations of evaluation and management by telemedicine and the availability of in person appointments. The patient expressed understanding and agreed to proceed.  Care Team   Patient Care Team: Lucille Passy, MD as PCP - General (Family Medicine) End, Harrell Gave, MD as PCP - Cardiology (Cardiology) Telford Nab, RN as Registered Nurse  Subjective:   HPI: Patient is following up with Hypotension. At 10.14.20 visit her Lisinopril was cut back to 5mg  due to hypotension thinking that med could be causing weakness and dizziness. She was advised to check her BP's tid and keep in touch. Per her MyChart communications her BP has been close to borderline but states that she remains tired.  Review of Systems  Constitutional: Negative for fever and malaise/fatigue.  HENT: Negative for congestion and hearing loss.   Eyes: Negative for blurred vision, discharge and redness.  Respiratory: Negative for cough and shortness of breath.   Cardiovascular: Negative for chest pain, palpitations and leg swelling.  Gastrointestinal: Negative for abdominal pain and heartburn.  Genitourinary: Negative for dysuria.  Musculoskeletal: Negative for falls.  Skin: Negative for rash.  Neurological: Negative for loss of consciousness and headaches.  Endo/Heme/Allergies: Does not bruise/bleed easily.  Psychiatric/Behavioral: Negative for depression and memory loss.     Patient Active  Problem List   Diagnosis Date Noted  . Vitamin D deficiency 04/13/2019  . Well woman exam without gynecological exam 02/13/2019  . Primary adenocarcinoma of right lung ([place]) 10/08/2018  . Primary cancer of right lower lobe of lung ([place]) 04/07/2018  . Lung nodule, solitary 02/18/2017  . Family history of ASCVD (arteriosclerotic cardiovascular disease) 12/31/2016  . Family history of premature CAD 11/11/2016  . GAD (generalized anxiety disorder) 10/30/2015  . Dyspnea on exertion 10/30/2015  . GERD (gastroesophageal reflux disease) 05/29/2014  . Essential hypertension 09/15/2012  . Dysmenorrhea 08/27/2011  . Anxiety and depression 04/23/2011    Social History   Tobacco Use  . Smoking status: Former Smoker    Packs/day: 1.00    Years: 25.00    Pack years: 25.00    Types: Cigarettes    Quit date: 11/22/2015    Years since quitting: 3.6  . Smokeless tobacco: Never Used  Substance Use Topics  . Alcohol use: Yes    Comment: OCC    Current Outpatient Medications:  .  ALPRAZolam (XANAX) 0.5 MG tablet, TAKE 1 TABLET BY MOUTH AT  BEDTIME AS NEEDED, Disp: 30 tablet, Rfl: 0 .  Aspirin-Caffeine (BC FAST PAIN RELIEF PO), Take 1 packet by mouth daily as needed (headache)., Disp: , Rfl:  .  ferrous sulfate (FEROSUL) 325 (65 FE) MG tablet, TAKE 1 TABLET(325 MG) BY MOUTH TWICE DAILY WITH A MEAL, Disp: 180 tablet, Rfl: 3 .  lisinopril (ZESTRIL) 10 MG tablet, Take 0.5 tablets (5 mg total) by mouth daily., Disp: , Rfl:  .  Multiple Vitamin (MULTIVITAMIN WITH MINERALS) TABS tablet, Take 2 tablets by mouth daily. Juice Plus fruit and vegetables, Disp: , Rfl:  .  omeprazole (PRILOSEC) 40  MG capsule, TAKE 1 CAPSULE(40 MG) BY MOUTH EVERY MORNING, Disp: 90 capsule, Rfl: 3 .  Phenylephrine-APAP-guaiFENesin (TYLENOL SINUS SEVERE PO), Take 2 tablets by mouth daily as needed (sinus headaches)., Disp: , Rfl:  .  sertraline (ZOLOFT) 100 MG tablet, TAKE 1 AND 1/2 TABLETS BY  MOUTH DAILY, Disp: 135 tablet, Rfl:  3 .  valACYclovir (VALTREX) 500 MG tablet, TAKE 2 TABLETS BY MOUTH TODAY, THEN 2 TABLETS TWICE DAILY AS NEEDED., Disp: 60 tablet, Rfl: 0  No Known Allergies  Objective:  There were no vitals taken for this visit.  VITALS: Per patient if applicable, see vitals. GENERAL: Alert, appears well and in no acute distress. HEENT: Atraumatic, conjunctiva clear, no obvious abnormalities on inspection of external nose and ears. NECK: Normal movements of the head and neck. CARDIOPULMONARY: No increased WOB. Speaking in clear sentences. I:E ratio WNL.  MS: Moves all visible extremities without noticeable abnormality. PSYCH: Pleasant and cooperative, well-groomed. Speech normal rate and rhythm. Affect is appropriate. Insight and judgement are appropriate. Attention is focused, linear, and appropriate.  NEURO: CN grossly intact. Oriented as arrived to appointment on time with no prompting. Moves both UE equally.  SKIN: No obvious lesions, wounds, erythema, or cyanosis noted on face or hands.  Depression screen [facility] 2/9 04/13/2019 02/14/2019 10/19/2018  Decreased Interest 1 0 2  Down, Depressed, Hopeless 0 0 0  PHQ - 2 Score 1 0 2  Altered sleeping 1 - 1  Tired, decreased energy 3 - 3  Change in appetite 0 - 0  Feeling bad or failure about yourself  0 - 0  Trouble concentrating 1 - 1  Moving slowly or fidgety/restless 0 - 0  Suicidal thoughts 0 - 0  PHQ-9 Score 6 - 7  Difficult doing work/chores Not difficult at all - Not difficult at all     . COVID-19 Education: The signs and symptoms of COVID-19 were discussed with the patient and how to seek care for testing if needed. The importance of social distancing was discussed today. . Reviewed expectations re: course of current medical issues. . Discussed self-management of symptoms. . Outlined signs and symptoms indicating need for more acute intervention. . Patient verbalized understanding and all questions were answered. Marland Kitchen Health Maintenance issues  including appropriate healthy diet, exercise, and smoking avoidance were discussed with patient. . See orders for this visit as documented in the electronic medical record.  Arnette Norris, MD  Records requested if needed. Time spent: *** minutes, of which >50% was spent in obtaining information about her symptoms, reviewing her previous labs, evaluations, and treatments, counseling her about her condition (please see the discussed topics above), and developing a plan to further investigate it; she had a number of questions which I addressed.   Lab Results  Component Value Date   WBC 8.8 04/13/2019   HGB 13.6 04/13/2019   HCT 40.8 04/13/2019   PLT 223.0 04/13/2019   GLUCOSE 86 04/13/2019   CHOL 201 (H) 02/14/2019   TRIG 200 (H) 02/14/2019   HDL 38 (L) 02/14/2019   LDLCALC 129 (H) 02/14/2019   ALT 22 04/13/2019   AST 20 04/13/2019   NA 138 04/13/2019   K 4.8 04/13/2019   CL 102 04/13/2019   CREATININE 0.85 04/13/2019   BUN 14 04/13/2019   CO2 27 04/13/2019   TSH 3.17 04/13/2019   INR 0.92 03/23/2018    Lab Results  Component Value Date   TSH 3.17 04/13/2019   Lab Results  Component Value Date  WBC 8.8 04/13/2019   HGB 13.6 04/13/2019   HCT 40.8 04/13/2019   MCV 88.6 04/13/2019   PLT 223.0 04/13/2019   Lab Results  Component Value Date   NA 138 04/13/2019   K 4.8 04/13/2019   CO2 27 04/13/2019   GLUCOSE 86 04/13/2019   BUN 14 04/13/2019   CREATININE 0.85 04/13/2019   BILITOT 0.6 04/13/2019   ALKPHOS 41 04/13/2019   AST 20 04/13/2019   ALT 22 04/13/2019   PROT 7.5 04/13/2019   ALBUMIN 4.9 04/13/2019   CALCIUM 10.1 04/13/2019   ANIONGAP 7 03/30/2018   GFR 72.67 04/13/2019   Lab Results  Component Value Date   CHOL 201 (H) 02/14/2019   Lab Results  Component Value Date   HDL 38 (L) 02/14/2019   Lab Results  Component Value Date   LDLCALC 129 (H) 02/14/2019   Lab Results  Component Value Date   TRIG 200 (H) 02/14/2019   Lab Results  Component Value  Date   CHOLHDL 5.3 (H) 02/14/2019   No results found for: HGBA1C     Assessment & Plan:   Problem List Items Addressed This Visit    None      I am having Kenyatte H. Walder maintain her multivitamin with minerals, Phenylephrine-APAP-guaiFENesin (TYLENOL SINUS SEVERE PO), Aspirin-Caffeine (BC FAST PAIN RELIEF PO), omeprazole, ferrous sulfate, lisinopril, sertraline, valACYclovir, and ALPRAZolam.  No orders of the defined types were placed in this encounter.    Arnette Norris, MD

## 2019-07-26 ENCOUNTER — Encounter: Payer: Self-pay | Admitting: Family Medicine

## 2019-07-26 ENCOUNTER — Other Ambulatory Visit: Payer: Self-pay

## 2019-07-26 ENCOUNTER — Telehealth: Payer: BC Managed Care – PPO | Admitting: Family Medicine

## 2019-07-26 VITALS — BP 156/88 | HR 95

## 2019-07-27 ENCOUNTER — Ambulatory Visit (INDEPENDENT_AMBULATORY_CARE_PROVIDER_SITE_OTHER): Payer: BC Managed Care – PPO | Admitting: Internal Medicine

## 2019-07-27 ENCOUNTER — Encounter: Payer: Self-pay | Admitting: Internal Medicine

## 2019-07-27 ENCOUNTER — Other Ambulatory Visit: Payer: Self-pay

## 2019-07-27 VITALS — BP 110/68 | HR 90 | Ht 64.0 in | Wt 170.5 lb

## 2019-07-27 DIAGNOSIS — I10 Essential (primary) hypertension: Secondary | ICD-10-CM

## 2019-07-27 DIAGNOSIS — R0609 Other forms of dyspnea: Secondary | ICD-10-CM

## 2019-07-27 DIAGNOSIS — R0600 Dyspnea, unspecified: Secondary | ICD-10-CM

## 2019-07-27 DIAGNOSIS — R079 Chest pain, unspecified: Secondary | ICD-10-CM | POA: Insufficient documentation

## 2019-07-27 DIAGNOSIS — I493 Ventricular premature depolarization: Secondary | ICD-10-CM

## 2019-07-27 NOTE — Patient Instructions (Signed)
Medication Instructions:  Your physician recommends that you continue on your current medications as directed. Please refer to the Current Medication list given to you today.  *If you need a refill on your cardiac medications before your next appointment, please call your pharmacy*  Lab Work: none If you have labs (blood work) drawn today and your tests are completely normal, you will receive your results only by: Marland Kitchen MyChart Message (if you have MyChart) OR . A paper copy in the mail If you have any lab test that is abnormal or we need to change your treatment, we will call you to review the results.  Testing/Procedures: Your physician has requested that you have an echocardiogram. Echocardiography is a painless test that uses sound waves to create images of your heart. It provides your doctor with information about the size and shape of your heart and how well your heart's chambers and valves are working. This procedure takes approximately one hour. There are no restrictions for this procedure. You may get an IV, if needed, to receive an ultrasound enhancing agent through to better visualize your heart.   Follow-Up: At [facility], you and your health needs are our priority.  As part of our continuing mission to provide you with exceptional heart care, we have created designated Provider Care Teams.  These Care Teams include your primary Cardiologist (physician) and Advanced Practice Providers (APPs -  Physician Assistants and Nurse Practitioners) who all work together to provide you with the care you need, when you need it.  Your next appointment:   12 months  The format for your next appointment:   In Person  Provider:    You may see Nelva Bush, MD or one of the following Advanced Practice Providers on your designated Care Team:    Murray Hodgkins, NP  Christell Faith, PA-C  Marrianne Mood, PA-C   Echocardiogram An echocardiogram is a procedure that uses painless sound  waves (ultrasound) to produce an image of the heart. Images from an echocardiogram can provide important information about:  Signs of coronary artery disease (CAD).  Aneurysm detection. An aneurysm is a weak or damaged part of an artery wall that bulges out from the normal force of blood pumping through the body.  Heart size and shape. Changes in the size or shape of the heart can be associated with certain conditions, including heart failure, aneurysm, and CAD.  Heart muscle function.  Heart valve function.  Signs of a past heart attack.  Fluid buildup around the heart.  Thickening of the heart muscle.  A tumor or infectious growth around the heart valves. Tell a health care provider about:  Any allergies you have.  All medicines you are taking, including vitamins, herbs, eye drops, creams, and over-the-counter medicines.  Any blood disorders you have.  Any surgeries you have had.  Any medical conditions you have.  Whether you are pregnant or may be pregnant. What are the risks? Generally, this is a safe procedure. However, problems may occur, including:  Allergic reaction to dye (contrast) that may be used during the procedure. What happens before the procedure? No specific preparation is needed. You may eat and drink normally. What happens during the procedure?   An IV tube may be inserted into one of your veins.  You may receive contrast through this tube. A contrast is an injection that improves the quality of the pictures from your heart.  A gel will be applied to your chest.  A wand-like tool (transducer)  will be moved over your chest. The gel will help to transmit the sound waves from the transducer.  The sound waves will harmlessly bounce off of your heart to allow the heart images to be captured in real-time motion. The images will be recorded on a computer. The procedure may vary among health care providers and hospitals. What happens after the  procedure?  You may return to your normal, everyday life, including diet, activities, and medicines, unless your health care provider tells you not to do that. Summary  An echocardiogram is a procedure that uses painless sound waves (ultrasound) to produce an image of the heart.  Images from an echocardiogram can provide important information about the size and shape of your heart, heart muscle function, heart valve function, and fluid buildup around your heart.  You do not need to do anything to prepare before this procedure. You may eat and drink normally.  After the echocardiogram is completed, you may return to your normal, everyday life, unless your health care provider tells you not to do that. This information is not intended to replace advice given to you by your health care provider. Make sure you discuss any questions you have with your health care provider. Document Revised: 10/07/2018 Document Reviewed: 07/19/2016 Elsevier Patient Education  [employer].

## 2019-07-27 NOTE — Progress Notes (Signed)
Follow-up Outpatient Visit Date: 07/27/2019  Primary Care Provider: Lucille Passy, MD [place] [id number]  Chief Complaint: Follow-up shortness of breath and chest pain  HPI:  Catherine Gilbert is a 44 y.o. female with history of hypertension, right lower lobe lung adenocarcinoma status post wedge resection in 2019 with prior tobacco abuse, headaches, depression, and anxiety, who presents for follow-up of shortness of breath and chest pain.  We last spoke in 09/2018, at which time she was doing well other than occasional right-sided chest discomfort near her thoracotomy site.  No further testing or intervention was pursued at that time.  Today, Catherine Gilbert reports that she feels similar to our last visit.  She still has occasional pain in the right side of her chest near her thoracotomy incision.  She does not have any exertional chest pain.  She complains of continued shortness of breath with activities such as walking her dog.  She does not have any wheezing nor edema or orthopnea.  She has struggled to lose weight.  She notes occasional skipped beats and presents rhythm strips on her Apple Watch today that demonstrate occasional PVCs.  Catherine Gilbert reports that she and Dr. Deborra Medina had tried to titrate off her antihypertensive medication, though she had a spike in her blood pressure when this occurred.  She is currently taking lisinopril 10 mg daily and 5 mg daily in alternating fashion.  --------------------------------------------------------------------------------------------------  Past Medical History:  Diagnosis Date  . Abnormal Pap smear   . Anemia   . Anxiety   . Anxiety state 04/23/2011  . Cancer ([place]) 02/2018   lung rt  . Depression   . Dysmenorrhea 08/27/2011  . Dyspnea on exertion 10/30/2015  . Essential hypertension 09/15/2012  . Family history of ASCVD (arteriosclerotic cardiovascular disease) 12/31/2016  . Family history of premature CAD 11/11/2016  . GAD  (generalized anxiety disorder) 10/30/2015  . GERD (gastroesophageal reflux disease) 05/29/2014  . Headache 01/01/2017  . HSV (herpes simplex virus) infection   . Hypertension   . Initiation of Depo Provera 09/24/2011  . Lung nodule    RLL on coronary calcium score CT  . Migraine headache    Past Surgical History:  Procedure Laterality Date  . BREAST CYST ASPIRATION Right 02/10/2018   neg  . CESAREAN SECTION     x 3   . COLPOSCOPY  2010  . DILATION AND CURETTAGE OF UTERUS    . FLEXIBLE BRONCHOSCOPY N/A 03/29/2018   Procedure: PREOP  BRONCHOSCOPY;  Surgeon: Nestor Lewandowsky, MD;  Location: [facility] ORS;  Service: Thoracic;  Laterality: N/A;  . THORACOTOMY Right 03/29/2018   Procedure: THORACOTOMY MAJOR- POSSIBLE LOBECTOMY;  Surgeon: Nestor Lewandowsky, MD;  Location: [facility] ORS;  Service: Thoracic;  Laterality: Right;  . TUBAL LIGATION    . VIDEO ASSISTED THORACOSCOPY (VATS)/WEDGE RESECTION Right 03/29/2018   Procedure: VIDEO ASSISTED THORACOSCOPY (VATS)/WEDGE RESECTION;  Surgeon: Nestor Lewandowsky, MD;  Location: [facility] ORS;  Service: Thoracic;  Laterality: Right;    Current Meds  Medication Sig  . ALPRAZolam (XANAX) 0.5 MG tablet TAKE 1 TABLET BY MOUTH AT  BEDTIME AS NEEDED  . Aspirin-Caffeine (BC FAST PAIN RELIEF PO) Take 1 packet by mouth daily as needed (headache).  . ferrous sulfate (FEROSUL) 325 (65 FE) MG tablet TAKE 1 TABLET(325 MG) BY MOUTH TWICE DAILY WITH A MEAL  . lisinopril (ZESTRIL) 10 MG tablet Take 0.5 tablets (5 mg total) by mouth daily.  . Multiple Vitamin (MULTIVITAMIN WITH MINERALS) TABS tablet Take  2 tablets by mouth daily. Juice Plus fruit and vegetables  . omeprazole (PRILOSEC) 40 MG capsule TAKE 1 CAPSULE(40 MG) BY MOUTH EVERY MORNING  . Phenylephrine-APAP-guaiFENesin (TYLENOL SINUS SEVERE PO) Take 2 tablets by mouth daily as needed (sinus headaches).  . sertraline (ZOLOFT) 100 MG tablet TAKE 1 AND 1/2 TABLETS BY  MOUTH DAILY  . valACYclovir (VALTREX) 500 MG tablet TAKE 2 TABLETS BY  MOUTH TODAY, THEN 2 TABLETS TWICE DAILY AS NEEDED.    Allergies: Patient has no known allergies.  Social History   Tobacco Use  . Smoking status: Former Smoker    Packs/day: 1.00    Years: 25.00    Pack years: 25.00    Types: Cigarettes    Quit date: 11/22/2015    Years since quitting: 3.6  . Smokeless tobacco: Never Used  Substance Use Topics  . Alcohol use: Yes    Comment: OCC  . Drug use: No    Family History  Problem Relation Age of Onset  . Stroke Mother 26       massive stroke  . Heart disease Father 50       congestive heart failure  . Hypertension Father   . Heart disease Brother 26       Suspected MI and SCD  . Heart disease Sister   . Stroke Sister   . Breast cancer Maternal Grandmother 35  . Breast cancer Cousin        x2 paternal    Review of Systems: A 12-system review of systems was performed and was negative except as noted in the HPI.  --------------------------------------------------------------------------------------------------  Physical Exam: BP 110/68 (BP Location: Left Arm, Patient Position: Sitting, Cuff Size: Normal)   Pulse 90   Ht _0  (1.626 m)   Wt 170 lb 8 oz (77.3 kg)   LMP 07/05/2019   BMI 29.27 kg/m   General: NAD. HEENT: No conjunctival pallor or scleral icterus. Facemask in place. Neck: Supple without lymphadenopathy, thyromegaly, JVD, or HJR. Lungs: Normal work of breathing. Clear to auscultation bilaterally without wheezes or crackles. Heart: Regular rate and rhythm without murmurs, rubs, or gallops. Non-displaced PMI. Abd: Bowel sounds present. Soft, NT/ND without hepatosplenomegaly Ext: No lower extremity edema. Radial, PT, and DP pulses are 2+ bilaterally. Skin: Warm and dry without rash.  EKG: Normal sinus rhythm with nonspecific T wave changes.  No significant change since 03/30/2018.  Lab Results  Component Value Date   WBC 8.8 04/13/2019   HGB 13.6 04/13/2019   HCT 40.8 04/13/2019   MCV 88.6 04/13/2019    PLT 223.0 04/13/2019    Lab Results  Component Value Date   NA 138 04/13/2019   K 4.8 04/13/2019   CL 102 04/13/2019   CO2 27 04/13/2019   BUN 14 04/13/2019   CREATININE 0.85 04/13/2019   GLUCOSE 86 04/13/2019   ALT 22 04/13/2019    Lab Results  Component Value Date   CHOL 201 (H) 02/14/2019   HDL 38 (L) 02/14/2019   LDLCALC 129 (H) 02/14/2019   TRIG 200 (H) 02/14/2019   CHOLHDL 5.3 (H) 02/14/2019    --------------------------------------------------------------------------------------------------  ASSESSMENT AND PLAN: Dyspnea on exertion: Likely multifactorial.  I have recommended that we obtain a transthoracic echocardiogram to exclude underlying structural cardiac abnormality.  Of note, Catherine Gilbert appears euvolemic on exam today.  If echo is unrevealing, Catherine Gilbert should work on weight loss and exercise to improve her functional capacity.  Chest pain: Pain is right sided surrounding thoracotomy incision.  It does not sound cardiac in nature.  Exercise tolerance test in 2018 was low risk.  No coronary artery calcification was seen at that time either.  No further work-up for ischemic heart disease is planned at this time unless echo shows reduced LVEF or wall motion abnormality.  Hypertension: Blood pressure well controlled today.  Continue alternating 5 mg and 10 mg daily of lisinopril under the direction of Dr. Deborra Medina.  PVCs: Symptomatic but without worrisome associated symptoms.  I encouraged Catherine Gilbert to continue to limit her caffeine intake.  No medication changes at this time.  Follow-up: Return to clinic in 1 year.  Nelva Bush, MD 07/27/2019 9:12 PM

## 2019-08-01 ENCOUNTER — Encounter: Payer: Self-pay | Admitting: Family Medicine

## 2019-08-01 ENCOUNTER — Ambulatory Visit (INDEPENDENT_AMBULATORY_CARE_PROVIDER_SITE_OTHER): Payer: BC Managed Care – PPO | Admitting: Family Medicine

## 2019-08-01 ENCOUNTER — Other Ambulatory Visit: Payer: Self-pay

## 2019-08-01 VITALS — BP 156/88 | HR 95

## 2019-08-01 DIAGNOSIS — I10 Essential (primary) hypertension: Secondary | ICD-10-CM

## 2019-08-01 DIAGNOSIS — K219 Gastro-esophageal reflux disease without esophagitis: Secondary | ICD-10-CM

## 2019-08-01 DIAGNOSIS — F411 Generalized anxiety disorder: Secondary | ICD-10-CM

## 2019-08-01 MED ORDER — ALPRAZOLAM 0.5 MG PO TABS: 0.5000 mg | ORAL_TABLET | Freq: Every evening | ORAL | 5 refills | Status: DC | PRN

## 2019-08-01 MED ORDER — OMEPRAZOLE 40 MG PO CPDR: DELAYED_RELEASE_CAPSULE | ORAL | 3 refills | Status: DC

## 2019-08-01 MED ORDER — FERROUS SULFATE 325 (65 FE) MG PO TABS: ORAL_TABLET | ORAL | 3 refills | Status: DC

## 2019-08-01 MED ORDER — VALACYCLOVIR HCL 500 MG PO TABS: ORAL_TABLET | ORAL | 5 refills | Status: DC

## 2019-08-01 MED ORDER — SERTRALINE HCL 100 MG PO TABS: 150.0000 mg | ORAL_TABLET | Freq: Every day | ORAL | 3 refills | Status: DC

## 2019-08-01 MED ORDER — LISINOPRIL 10 MG PO TABS: ORAL_TABLET | ORAL | 3 refills | Status: DC

## 2019-08-01 NOTE — Progress Notes (Signed)
Virtual Visit via Video   Due to the COVID-19 pandemic, this visit was completed with telemedicine (audio/video) technology to reduce patient and provider exposure as well as to preserve personal protective equipment.   I connected with Catherine Gilbert by a video enabled telemedicine application and verified that I am speaking with the correct person using two identifiers. Location patient: Home Location provider: [facility], Office Persons participating in the virtual visit: Haze Rushing, MD   I discussed the limitations of evaluation and management by telemedicine and the availability of in person appointments. The patient expressed understanding and agreed to proceed.  Care Team   Patient Care Team: Lucille Passy, MD as PCP - General (Family Medicine) End, Harrell Gave, MD as PCP - Cardiology (Cardiology) Telford Nab, RN as Registered Nurse  Subjective:   HPI:   Follow up- See problem based charting.   Review of Systems  Constitutional: Negative for fever and malaise/fatigue.  HENT: Negative for congestion and hearing loss.   Eyes: Negative for blurred vision, discharge and redness.  Respiratory: Negative for cough and shortness of breath.   Cardiovascular: Negative for chest pain, palpitations and leg swelling.  Gastrointestinal: Negative for abdominal pain and heartburn.  Genitourinary: Negative for dysuria.  Musculoskeletal: Negative for falls.  Skin: Negative for rash.  Neurological: Negative for loss of consciousness and headaches.  Endo/Heme/Allergies: Does not bruise/bleed easily.  Psychiatric/Behavioral: Negative for depression.  All other systems reviewed and are negative.    Patient Active Problem List   Diagnosis Date Noted  . Chest pain of uncertain etiology 18/29/9371  . PVC's (premature ventricular contractions) 07/27/2019  . Vitamin D deficiency 04/13/2019  . Primary adenocarcinoma of right lung ([place]) 10/08/2018  . Primary cancer  of right lower lobe of lung ([place]) 04/07/2018  . Lung nodule, solitary 02/18/2017  . Family history of ASCVD (arteriosclerotic cardiovascular disease) 12/31/2016  . Family history of premature CAD 11/11/2016  . GAD (generalized anxiety disorder) 10/30/2015  . Dyspnea on exertion 10/30/2015  . GERD (gastroesophageal reflux disease) 05/29/2014  . Essential hypertension 09/15/2012  . Dysmenorrhea 08/27/2011  . Anxiety and depression 04/23/2011    Social History   Tobacco Use  . Smoking status: Former Smoker    Packs/day: 1.00    Years: 25.00    Pack years: 25.00    Types: Cigarettes    Quit date: 11/22/2015    Years since quitting: 3.6  . Smokeless tobacco: Never Used  Substance Use Topics  . Alcohol use: Yes    Comment: OCC    Current Outpatient Medications:  .  ALPRAZolam (XANAX) 0.5 MG tablet, Take 1 tablet (0.5 mg total) by mouth at bedtime as needed., Disp: 30 tablet, Rfl: 5 .  Aspirin-Caffeine (BC FAST PAIN RELIEF PO), Take 1 packet by mouth daily as needed (headache)., Disp: , Rfl:  .  ferrous sulfate (FEROSUL) 325 (65 FE) MG tablet, TAKE 1 TABLET(325 MG) BY MOUTH TWICE DAILY WITH A MEAL, Disp: 180 tablet, Rfl: 3 .  lisinopril (ZESTRIL) 10 MG tablet, Take 1/2-1 qd, Disp: 90 tablet, Rfl: 3 .  Multiple Vitamin (MULTIVITAMIN WITH MINERALS) TABS tablet, Take 2 tablets by mouth daily. Juice Plus fruit and vegetables, Disp: , Rfl:  .  omeprazole (PRILOSEC) 40 MG capsule, TAKE 1 CAPSULE(40 MG) BY MOUTH EVERY MORNING, Disp: 90 capsule, Rfl: 3 .  Phenylephrine-APAP-guaiFENesin (TYLENOL SINUS SEVERE PO), Take 2 tablets by mouth daily as needed (sinus headaches)., Disp: , Rfl:  .  sertraline (ZOLOFT) 100  MG tablet, Take 1.5 tablets (150 mg total) by mouth daily., Disp: 135 tablet, Rfl: 3 .  valACYclovir (VALTREX) 500 MG tablet, TAKE 2 TABLETS BY MOUTH TODAY, THEN 2 TABLETS TWICE DAILY AS NEEDED., Disp: 60 tablet, Rfl: 5  No Known Allergies  Objective:  BP (!) 156/88   Pulse 95   LMP  07/01/2019   VITALS: Per patient if applicable, see vitals. GENERAL: Alert, appears well and in no acute distress. HEENT: Atraumatic, conjunctiva clear, no obvious abnormalities on inspection of external nose and ears. NECK: Normal movements of the head and neck. CARDIOPULMONARY: No increased WOB. Speaking in clear sentences. I:E ratio WNL.  MS: Moves all visible extremities without noticeable abnormality. PSYCH: Pleasant and cooperative, well-groomed. Speech normal rate and rhythm. Affect is appropriate. Insight and judgement are appropriate. Attention is focused, linear, and appropriate.  NEURO: CN grossly intact. Oriented as arrived to appointment on time with no prompting. Moves both UE equally.  SKIN: No obvious lesions, wounds, erythema, or cyanosis noted on face or hands.  Depression screen [facility] 2/9 07/29/2019 04/13/2019 02/14/2019  Decreased Interest 0 1 0  Down, Depressed, Hopeless 0 0 0  PHQ - 2 Score 0 1 0  Altered sleeping - 1 -  Tired, decreased energy - 3 -  Change in appetite - 0 -  Feeling bad or failure about yourself  - 0 -  Trouble concentrating - 1 -  Moving slowly or fidgety/restless - 0 -  Suicidal thoughts - 0 -  PHQ-9 Score - 6 -  Difficult doing work/chores - Not difficult at all -     . COVID-19 Education: The signs and symptoms of COVID-19 were discussed with the patient and how to seek care for testing if needed. The importance of social distancing was discussed today. . Reviewed expectations re: course of current medical issues. . Discussed self-management of symptoms. . Outlined signs and symptoms indicating need for more acute intervention. . Patient verbalized understanding and all questions were answered. Marland Kitchen Health Maintenance issues including appropriate healthy diet, exercise, and smoking avoidance were discussed with patient. . See orders for this visit as documented in the electronic medical record.  Arnette Norris, MD  Records requested if needed.  Time spent: 25 minutes, of which >50% was spent in obtaining information about her symptoms, reviewing her previous labs, evaluations, and treatments, counseling her about her condition (please see the discussed topics above), and developing a plan to further investigate it; she had a number of questions which I addressed.   Lab Results  Component Value Date   WBC 8.8 04/13/2019   HGB 13.6 04/13/2019   HCT 40.8 04/13/2019   PLT 223.0 04/13/2019   GLUCOSE 86 04/13/2019   CHOL 201 (H) 02/14/2019   TRIG 200 (H) 02/14/2019   HDL 38 (L) 02/14/2019   LDLCALC 129 (H) 02/14/2019   ALT 22 04/13/2019   AST 20 04/13/2019   NA 138 04/13/2019   K 4.8 04/13/2019   CL 102 04/13/2019   CREATININE 0.85 04/13/2019   BUN 14 04/13/2019   CO2 27 04/13/2019   TSH 3.17 04/13/2019   INR 0.92 03/23/2018    Lab Results  Component Value Date   TSH 3.17 04/13/2019   Lab Results  Component Value Date   WBC 8.8 04/13/2019   HGB 13.6 04/13/2019   HCT 40.8 04/13/2019   MCV 88.6 04/13/2019   PLT 223.0 04/13/2019   Lab Results  Component Value Date   NA 138 04/13/2019  K 4.8 04/13/2019   CO2 27 04/13/2019   GLUCOSE 86 04/13/2019   BUN 14 04/13/2019   CREATININE 0.85 04/13/2019   BILITOT 0.6 04/13/2019   ALKPHOS 41 04/13/2019   AST 20 04/13/2019   ALT 22 04/13/2019   PROT 7.5 04/13/2019   ALBUMIN 4.9 04/13/2019   CALCIUM 10.1 04/13/2019   ANIONGAP 7 03/30/2018   GFR 72.67 04/13/2019   Lab Results  Component Value Date   CHOL 201 (H) 02/14/2019   Lab Results  Component Value Date   HDL 38 (L) 02/14/2019   Lab Results  Component Value Date   LDLCALC 129 (H) 02/14/2019   Lab Results  Component Value Date   TRIG 200 (H) 02/14/2019   Lab Results  Component Value Date   CHOLHDL 5.3 (H) 02/14/2019   No results found for: HGBA1C     Assessment & Plan:   Problem List Items Addressed This Visit    None    Visit Diagnoses    Gastroesophageal reflux disease       Relevant  Medications   omeprazole (PRILOSEC) 40 MG capsule      I have changed Catherine Gilbert's ALPRAZolam, lisinopril, and sertraline. I am also having her maintain her multivitamin with minerals, Phenylephrine-APAP-guaiFENesin (TYLENOL SINUS SEVERE PO), Aspirin-Caffeine (BC FAST PAIN RELIEF PO), ferrous sulfate, omeprazole, and valACYclovir.  Meds ordered this encounter  Medications  . ALPRAZolam (XANAX) 0.5 MG tablet    Sig: Take 1 tablet (0.5 mg total) by mouth at bedtime as needed.    Dispense:  30 tablet    Refill:  5  . ferrous sulfate (FEROSUL) 325 (65 FE) MG tablet    Sig: TAKE 1 TABLET(325 MG) BY MOUTH TWICE DAILY WITH A MEAL    Dispense:  180 tablet    Refill:  3  . lisinopril (ZESTRIL) 10 MG tablet    Sig: Take 1/2-1 qd    Dispense:  90 tablet    Refill:  3  . omeprazole (PRILOSEC) 40 MG capsule    Sig: TAKE 1 CAPSULE(40 MG) BY MOUTH EVERY MORNING    Dispense:  90 capsule    Refill:  3  . sertraline (ZOLOFT) 100 MG tablet    Sig: Take 1.5 tablets (150 mg total) by mouth daily.    Dispense:  135 tablet    Refill:  3  . valACYclovir (VALTREX) 500 MG tablet    Sig: TAKE 2 TABLETS BY MOUTH TODAY, THEN 2 TABLETS TWICE DAILY AS NEEDED.    Dispense:  60 tablet    Refill:  5     Arnette Norris, MD

## 2019-08-01 NOTE — Assessment & Plan Note (Signed)
History:  She takes Lisinopril 10mg  1/2-1qd according to how she is doing- hasn't been dizzy.  BP last was 110/67.   Review: taking medications as instructed, no medication side effects noted, no TIAs, no chest pain on exertion, no dyspnea on exertion, no swelling of ankles. Smoker: No.  BP Readings from Last 3 Encounters:  07/26/19 (!) 156/88  07/27/19 110/68  07/26/19 (!) 156/88   Lab Results  Component Value Date   CREATININE 0.85 04/13/2019     Assessment/Plan: 1. Medication: no change. 2. Dietary sodium restriction. 3. Regular aerobic exercise.

## 2019-08-01 NOTE — Assessment & Plan Note (Signed)
Assessment: with depression- Current symptoms none. . No current suicidal and homicidal ideation. Side effects from treatment: none.  Plan: 1. Medications: Zoloft- no changes made. Discussed potential risks, expected benefits, possible side effects of the medicine. We also discussed how to take it correctly and dosing instructions.   Depression screen [facility] 2/9 07/29/2019 04/13/2019 02/14/2019 10/19/2018 10/04/2018  Decreased Interest 0 1 0 2 3  Down, Depressed, Hopeless 0 0 0 0 1  PHQ - 2 Score 0 1 0 2 4  Altered sleeping - 1 - 1 2  Tired, decreased energy - 3 - 3 3  Change in appetite - 0 - 0 2  Feeling bad or failure about yourself  - 0 - 0 0  Trouble concentrating - 1 - 1 3  Moving slowly or fidgety/restless - 0 - 0 0  Suicidal thoughts - 0 - 0 0  PHQ-9 Score - 6 - 7 14  Difficult doing work/chores - Not difficult at all - Not difficult at all Somewhat difficult   GAD 7 : Generalized Anxiety Score 04/13/2019 10/19/2018 10/04/2018 09/29/2018  Nervous, Anxious, on Edge 0 1 3 3   Control/stop worrying 0 0 3 2  Worry too much - different things 0 0 2 3  Trouble relaxing 0 0 0 2  Restless 0 0 0 0  Easily annoyed or irritable 1 1 2 2   Afraid - awful might happen 0 0 3 2  Total GAD 7 Score 1 2 13 14   Anxiety Difficulty Not difficult at all Not difficult at all Very difficult Very difficult     2. Labs: see orders. 3. If she has any significant side effects to the medicine, she is to stop it and call for advice. Instructed patient to contact office or on-call physician promptly should condition worsen or any new symptoms appear.

## 2019-08-08 ENCOUNTER — Encounter: Payer: Self-pay | Admitting: Family Medicine

## 2019-08-09 ENCOUNTER — Other Ambulatory Visit (HOSPITAL_COMMUNITY)
Admission: RE | Admit: 2019-08-09 | Discharge: 2019-08-09 | Disposition: A | Discharge status: Home / Self Care | Payer: BC Managed Care – PPO | Source: Ambulatory Visit | Attending: Nurse Practitioner | Admitting: Nurse Practitioner

## 2019-08-09 ENCOUNTER — Ambulatory Visit (INDEPENDENT_AMBULATORY_CARE_PROVIDER_SITE_OTHER): Payer: BC Managed Care – PPO | Admitting: Nurse Practitioner

## 2019-08-09 ENCOUNTER — Other Ambulatory Visit: Payer: Self-pay

## 2019-08-09 ENCOUNTER — Encounter: Payer: Self-pay | Admitting: Nurse Practitioner

## 2019-08-09 VITALS — BP 112/74 | HR 115 | Temp 97.8°F | Ht 64.0 in | Wt 170.6 lb

## 2019-08-09 DIAGNOSIS — R8290 Unspecified abnormal findings in urine: Secondary | ICD-10-CM | POA: Diagnosis not present

## 2019-08-09 DIAGNOSIS — N3001 Acute cystitis with hematuria: Secondary | ICD-10-CM | POA: Insufficient documentation

## 2019-08-09 DIAGNOSIS — N920 Excessive and frequent menstruation with regular cycle: Secondary | ICD-10-CM | POA: Insufficient documentation

## 2019-08-09 DIAGNOSIS — N760 Acute vaginitis: Secondary | ICD-10-CM

## 2019-08-09 DIAGNOSIS — R300 Dysuria: Secondary | ICD-10-CM | POA: Diagnosis not present

## 2019-08-09 DIAGNOSIS — R350 Frequency of micturition: Secondary | ICD-10-CM | POA: Diagnosis not present

## 2019-08-09 LAB — POCT URINALYSIS DIPSTICK
Bilirubin, UA: NEGATIVE
Blood, UA: NEGATIVE
Glucose, UA: NEGATIVE
Ketones, UA: NEGATIVE
Leukocytes, UA: NEGATIVE
Nitrite, UA: NEGATIVE
Protein, UA: NEGATIVE
Spec Grav, UA: 1.01 (ref 1.010–1.025)
Urobilinogen, UA: 0.2 E.U./dL
pH, UA: 6.5 (ref 5.0–8.0)

## 2019-08-09 MED ORDER — NITROFURANTOIN MONOHYD MACRO 100 MG PO CAPS: 100.0000 mg | ORAL_CAPSULE | Freq: Two times a day (BID) | ORAL | 0 refills | Status: DC

## 2019-08-09 NOTE — Patient Instructions (Addendum)
Positive E. Coli Additional macrobid sent  You will be contacted to schedule appt for pelvic US.  Start oral abx with food

## 2019-08-09 NOTE — Progress Notes (Signed)
nche,

## 2019-08-09 NOTE — Progress Notes (Signed)
Subjective:  Patient ID: Catherine Gilbert, female    DOB: 10/02/1975  Age: 44 y.o. MRN: [id number]  CC: Urinary Tract Infection (pt c/o of dysuria,frequent urinate,odor/going on 3 days/ no otc)  Vaginal Bleeding The patient's primary symptoms include a genital odor, vaginal bleeding and vaginal discharge. The patient's pertinent negatives include no genital itching, genital lesions, genital rash, missed menses or pelvic pain. This is a recurrent problem. The current episode started more than 1 month ago. The problem occurs constantly. The problem has been waxing and waning. The patient is experiencing no pain. Associated symptoms include dysuria, frequency and urgency. Pertinent negatives include no abdominal pain, anorexia, back pain, chills, constipation, diarrhea, discolored urine, fever, flank pain, headaches, hematuria, joint pain, joint swelling, nausea, painful intercourse, rash, sore throat or vomiting. The vaginal discharge was bloody. The vaginal bleeding is heavier than menses. She has not been passing clots. She has not been passing tissue. Nothing aggravates the symptoms. She has tried nothing for the symptoms. She is sexually active. No, her partner does not have an STD. She uses tubal ligation for contraception. Her menstrual history has been regular. Her past medical history is significant for menorrhagia. There is no history of metrorrhagia or PID.  continuous vaginal spotting after completion of cycle.  Reviewed past Medical, Social and Family history today.  Outpatient Medications Prior to Visit  Medication Sig Dispense Refill  . ALPRAZolam (XANAX) 0.5 MG tablet Take 1 tablet (0.5 mg total) by mouth at bedtime as needed. 30 tablet 5  . Aspirin-Caffeine (BC FAST PAIN RELIEF PO) Take 1 packet by mouth daily as needed (headache).    . ferrous sulfate (FEROSUL) 325 (65 FE) MG tablet TAKE 1 TABLET(325 MG) BY MOUTH TWICE DAILY WITH A MEAL 180 tablet 3  . lisinopril (ZESTRIL) 10 MG tablet  Take 1/2-1 qd 90 tablet 3  . Multiple Vitamin (MULTIVITAMIN WITH MINERALS) TABS tablet Take 2 tablets by mouth daily. Juice Plus fruit and vegetables    . omeprazole (PRILOSEC) 40 MG capsule TAKE 1 CAPSULE(40 MG) BY MOUTH EVERY MORNING 90 capsule 3  . Phenylephrine-APAP-guaiFENesin (TYLENOL SINUS SEVERE PO) Take 2 tablets by mouth daily as needed (sinus headaches).    . sertraline (ZOLOFT) 100 MG tablet Take 1.5 tablets (150 mg total) by mouth daily. 135 tablet 3  . valACYclovir (VALTREX) 500 MG tablet TAKE 2 TABLETS BY MOUTH TODAY, THEN 2 TABLETS TWICE DAILY AS NEEDED. 60 tablet 5   No facility-administered medications prior to visit.    ROS See HPI  Objective:  BP 112/74   Pulse (!) 115   Temp 97.8 F (36.6 C) (Tympanic)   Ht 5\' 4"  (1.626 m)   Wt 170 lb 9.6 oz (77.4 kg)   SpO2 97%   BMI 29.28 kg/m   BP Readings from Last 3 Encounters:  08/09/19 112/74  07/26/19 (!) 156/88  07/27/19 110/68    Wt Readings from Last 3 Encounters:  08/09/19 170 lb 9.6 oz (77.4 kg)  07/27/19 170 lb 8 oz (77.3 kg)  04/13/19 165 lb 9.6 oz (75.1 kg)    Physical Exam Vitals reviewed. Exam conducted with a chaperone present.  Cardiovascular:     Rate and Rhythm: Normal rate.     Pulses: Normal pulses.  Pulmonary:     Effort: Pulmonary effort is normal.  Genitourinary:    General: Normal vulva.     Labia:        Right: No rash or tenderness.  Left: No rash or tenderness.      Vagina: Vaginal discharge present.     Cervix: Discharge present.     Adnexa: Right adnexa normal and left adnexa normal.  Lymphadenopathy:     Lower Body: No right inguinal adenopathy. No left inguinal adenopathy.  Neurological:     Mental Status: She is alert.     Lab Results  Component Value Date   WBC 8.8 04/13/2019   HGB 13.6 04/13/2019   HCT 40.8 04/13/2019   PLT 223.0 04/13/2019   GLUCOSE 86 04/13/2019   CHOL 201 (H) 02/14/2019   TRIG 200 (H) 02/14/2019   HDL 38 (L) 02/14/2019   LDLCALC 129  (H) 02/14/2019   ALT 22 04/13/2019   AST 20 04/13/2019   NA 138 04/13/2019   K 4.8 04/13/2019   CL 102 04/13/2019   CREATININE 0.85 04/13/2019   BUN 14 04/13/2019   CO2 27 04/13/2019   TSH 3.17 04/13/2019   INR 0.92 03/23/2018   Assessment & Plan:  This visit occurred during the SARS-CoV-2 public health emergency.  Safety protocols were in place, including screening questions prior to the visit, additional usage of staff PPE, and extensive cleaning of exam room while observing appropriate contact time as indicated for disinfecting solutions.   Catherine Gilbert was seen today for urinary tract infection.  Diagnoses and all orders for this visit:  Acute cystitis with hematuria -     POCT urinalysis dipstick -     Urine Culture -     Discontinue: nitrofurantoin, macrocrystal-monohydrate, (MACROBID) 100 MG capsule; Take 1 capsule (100 mg total) by mouth 2 (two) times daily. -     nitrofurantoin, macrocrystal-monohydrate, (MACROBID) 100 MG capsule; Take 1 capsule (100 mg total) by mouth 2 (two) times daily.  Abnormal finding on urinalysis -     POCT urinalysis dipstick -     Urine Culture -     Discontinue: nitrofurantoin, macrocrystal-monohydrate, (MACROBID) 100 MG capsule; Take 1 capsule (100 mg total) by mouth 2 (two) times daily. -     nitrofurantoin, macrocrystal-monohydrate, (MACROBID) 100 MG capsule; Take 1 capsule (100 mg total) by mouth 2 (two) times daily.  Acute vaginitis -     Cervicovaginal ancillary only( [place])  Menorrhagia with regular cycle -     US Pelvic Complete With Transvaginal; Future   I am having Catherine Gilbert maintain her multivitamin with minerals, Phenylephrine-APAP-guaiFENesin (TYLENOL SINUS SEVERE PO), Aspirin-Caffeine (BC FAST PAIN RELIEF PO), ALPRAZolam, ferrous sulfate, lisinopril, omeprazole, sertraline, valACYclovir, and nitrofurantoin (macrocrystal-monohydrate).  Meds ordered this encounter  Medications  . DISCONTD: nitrofurantoin,  macrocrystal-monohydrate, (MACROBID) 100 MG capsule    Sig: Take 1 capsule (100 mg total) by mouth 2 (two) times daily.    Dispense:  6 capsule    Refill:  0    Order Specific Question:   Supervising Provider    Answer:   Lucille Passy [[id number]]  . nitrofurantoin, macrocrystal-monohydrate, (MACROBID) 100 MG capsule    Sig: Take 1 capsule (100 mg total) by mouth 2 (two) times daily.    Dispense:  8 capsule    Refill:  0    Order Specific Question:   Supervising Provider    Answer:   Lucille Passy [[id number]]    Problem List Items Addressed This Visit      Other   Menorrhagia with regular cycle   Relevant Orders   US Pelvic Complete With Transvaginal    Other Visit Diagnoses    Acute cystitis  with hematuria    -  Primary   Relevant Medications   nitrofurantoin, macrocrystal-monohydrate, (MACROBID) 100 MG capsule   Other Relevant Orders   POCT urinalysis dipstick (Completed)   Urine Culture (Completed)   Abnormal finding on urinalysis       Relevant Medications   nitrofurantoin, macrocrystal-monohydrate, (MACROBID) 100 MG capsule   Other Relevant Orders   POCT urinalysis dipstick (Completed)   Urine Culture (Completed)   Acute vaginitis       Relevant Orders   Cervicovaginal ancillary only( [place]) (Completed)       Follow-up: Return if symptoms worsen or fail to improve.  Wilfred Lacy, NP

## 2019-08-10 LAB — CERVICOVAGINAL ANCILLARY ONLY
Bacterial Vaginitis (gardnerella): NEGATIVE
Candida Glabrata: NEGATIVE
Candida Vaginitis: NEGATIVE
Comment: NEGATIVE
Comment: NEGATIVE
Comment: NEGATIVE

## 2019-08-11 ENCOUNTER — Encounter: Payer: Self-pay | Admitting: Nurse Practitioner

## 2019-08-11 LAB — URINE CULTURE
MICRO NUMBER:: 10132570
SPECIMEN QUALITY:: ADEQUATE

## 2019-08-11 MED ORDER — NITROFURANTOIN MONOHYD MACRO 100 MG PO CAPS: 100.0000 mg | ORAL_CAPSULE | Freq: Two times a day (BID) | ORAL | 0 refills | Status: DC

## 2019-08-18 ENCOUNTER — Other Ambulatory Visit: Payer: BC Managed Care – PPO

## 2019-08-24 ENCOUNTER — Other Ambulatory Visit: Payer: Self-pay

## 2019-08-24 DIAGNOSIS — K219 Gastro-esophageal reflux disease without esophagitis: Secondary | ICD-10-CM

## 2019-08-24 MED ORDER — LISINOPRIL 10 MG PO TABS: ORAL_TABLET | ORAL | 3 refills | Status: DC

## 2019-08-24 MED ORDER — OMEPRAZOLE 40 MG PO CPDR: DELAYED_RELEASE_CAPSULE | ORAL | 3 refills | Status: DC

## 2019-08-26 ENCOUNTER — Ambulatory Visit
Admission: RE | Admit: 2019-08-26 | Discharge: 2019-08-26 | Disposition: A | Discharge status: Home / Self Care | Payer: BC Managed Care – PPO | Source: Ambulatory Visit | Attending: Nurse Practitioner | Admitting: Nurse Practitioner

## 2019-08-26 DIAGNOSIS — N920 Excessive and frequent menstruation with regular cycle: Secondary | ICD-10-CM

## 2019-08-26 DIAGNOSIS — N83201 Unspecified ovarian cyst, right side: Secondary | ICD-10-CM | POA: Diagnosis not present

## 2019-08-30 ENCOUNTER — Other Ambulatory Visit: Payer: Self-pay | Admitting: Nurse Practitioner

## 2019-08-30 DIAGNOSIS — N920 Excessive and frequent menstruation with regular cycle: Secondary | ICD-10-CM

## 2019-08-31 ENCOUNTER — Other Ambulatory Visit: Payer: Self-pay

## 2019-08-31 ENCOUNTER — Ambulatory Visit (INDEPENDENT_AMBULATORY_CARE_PROVIDER_SITE_OTHER): Payer: BC Managed Care – PPO

## 2019-08-31 DIAGNOSIS — R0600 Dyspnea, unspecified: Secondary | ICD-10-CM

## 2019-08-31 DIAGNOSIS — R0609 Other forms of dyspnea: Secondary | ICD-10-CM

## 2019-09-20 ENCOUNTER — Ambulatory Visit (INDEPENDENT_AMBULATORY_CARE_PROVIDER_SITE_OTHER): Payer: BC Managed Care – PPO | Admitting: Obstetrics and Gynecology

## 2019-09-20 ENCOUNTER — Encounter: Payer: Self-pay | Admitting: Obstetrics and Gynecology

## 2019-09-20 ENCOUNTER — Other Ambulatory Visit: Payer: Self-pay

## 2019-09-20 VITALS — Ht 64.0 in | Wt 168.0 lb

## 2019-09-20 DIAGNOSIS — N923 Ovulation bleeding: Secondary | ICD-10-CM

## 2019-09-20 DIAGNOSIS — N938 Other specified abnormal uterine and vaginal bleeding: Secondary | ICD-10-CM | POA: Diagnosis not present

## 2019-09-20 NOTE — Progress Notes (Signed)
Patient ID: Catherine Gilbert, female   DOB: [birth year], 44 y.o.   MRN: [id number]  Reason for Consult: Menstrual Problem and Menorrhagia   Referred by Lesleigh Noe, MD  Subjective:     HPI:  Catherine Gilbert is a 44 y.o. female she reports that for the past year she has had a brown discharge for 1 to 2 weeks after her menstrual cycle.  She reports that her menstrual periods have been regular occurring every 3 to 4 weeks.  She generally has 4 to 5 days of moderate to heavy bleeding.  She has not been passing any large clots.  She has noticed some flooding sensations of blood with her menstrual cycle.  She denies any accidents or absenteeism from work related to her periods.  She has occasional pain and cramps and aching throughout her midsection.  However this generally resolves with Motrin and she rates the pain as mild to moderate.  She reports that the pain and amount of her bleeding has generally improved with age.  She is sexually active however the brown discharge is causing some distress in her relationship as she does not like to be sexually active when the discharge is occurring.  She does not have pain with intercourse.  She has a history of herpes and has infrequent outbreaks approximately 1 to 2-year.  The frequency of these outbreaks is also improved with age.  She uses a tubal ligation for birth control.  First day of her last menstrual period was 08/23/2019  She reports remote history of abnormal Pap smear. She denies any history of a LEEP or cervical cone procedure.  Her last Pap smear was normal cytology negative for HPV in May 2019.  Past Medical History:  Diagnosis Date  . Abnormal Pap smear   . Anemia   . Anxiety   . Anxiety state 04/23/2011  . Cancer ([place]) 02/2018   lung rt  . Depression   . Dysmenorrhea 08/27/2011  . Dyspnea on exertion 10/30/2015  . Essential hypertension 09/15/2012  . Family history of ASCVD (arteriosclerotic cardiovascular disease) 12/31/2016  .  Family history of premature CAD 11/11/2016  . GAD (generalized anxiety disorder) 10/30/2015  . GERD (gastroesophageal reflux disease) 05/29/2014  . Headache 01/01/2017  . HSV (herpes simplex virus) infection   . Hypertension   . Initiation of Depo Provera 09/24/2011  . Lung nodule    RLL on coronary calcium score CT  . Migraine headache    Family History  Problem Relation Age of Onset  . Stroke Mother 48       massive stroke  . Heart disease Father 52       congestive heart failure  . Hypertension Father   . Heart disease Brother 80       Suspected MI and SCD  . Heart disease Sister   . Stroke Sister   . Breast cancer Maternal Grandmother 29  . Breast cancer Cousin        x2 paternal   Past Surgical History:  Procedure Laterality Date  . BREAST CYST ASPIRATION Right 02/10/2018   neg  . CESAREAN SECTION     x 3   . COLPOSCOPY  2010  . DILATION AND CURETTAGE OF UTERUS    . FLEXIBLE BRONCHOSCOPY N/A 03/29/2018   Procedure: PREOP  BRONCHOSCOPY;  Surgeon: Nestor Lewandowsky, MD;  Location: [facility] ORS;  Service: Thoracic;  Laterality: N/A;  . THORACOTOMY Right 03/29/2018   Procedure: THORACOTOMY MAJOR- POSSIBLE LOBECTOMY;  Surgeon:  Nestor Lewandowsky, MD;  Location: [facility] OR[facility];  Service: Thoracic;  Laterality: Right;  . TUBAL LIGATION    . VIDEO ASSISTED THORACOSCOPY (VATS)/WEDGE RESECTION Right 03/29/2018   Procedure: VIDEO ASSISTED THORACOSCOPY (VATS)/WEDGE RESECTION;  Surgeon: Nestor Lewandowsky, MD;  Location: [facility];  Service: Thoracic;  Laterality: Right;    Short Social History:  Social History   Tobacco Use  . Smoking status: Former Smoker    Packs/day: 1.00    Years: 25.00    Pack years: 25.00    Types: Cigarettes    Quit date: 11/22/2015    Years since quitting: 3.8  . Smokeless tobacco: Never Used  Substance Use Topics  . Alcohol use: Yes    Comment: OCC    No Known Allergies  Current Outpatient Medications  Medication Sig Dispense Refill  . ALPRAZolam (XANAX) 0.5 MG tablet  Take 1 tablet (0.5 mg total) by mouth at bedtime as needed. 30 tablet 5  . Aspirin-Caffeine (BC FAST PAIN RELIEF PO) Take 1 packet by mouth daily as needed (headache).    . ferrous sulfate (FEROSUL) 325 (65 FE) MG tablet TAKE 1 TABLET(325 MG) BY MOUTH TWICE DAILY WITH A MEAL 180 tablet 3  . lisinopril (ZESTRIL) 10 MG tablet Take 1/2-1 qd 90 tablet 3  . Multiple Vitamin (MULTIVITAMIN WITH MINERALS) TABS tablet Take 2 tablets by mouth daily. Juice Plus fruit and vegetables    . nitrofurantoin, macrocrystal-monohydrate, (MACROBID) 100 MG capsule Take 1 capsule (100 mg total) by mouth 2 (two) times daily. 8 capsule 0  . omeprazole (PRILOSEC) 40 MG capsule TAKE 1 CAPSULE(40 MG) BY MOUTH EVERY MORNING 90 capsule 3  . Phenylephrine-APAP-guaiFENesin (TYLENOL SINUS SEVERE PO) Take 2 tablets by mouth daily as needed (sinus headaches).    . sertraline (ZOLOFT) 100 MG tablet Take 1.5 tablets (150 mg total) by mouth daily. 135 tablet 3  . valACYclovir (VALTREX) 500 MG tablet TAKE 2 TABLETS BY MOUTH TODAY, THEN 2 TABLETS TWICE DAILY AS NEEDED. 60 tablet 5   No current facility-administered medications for this visit.    Review of Systems  Constitutional: Positive for fatigue. Negative for chills, fever and unexpected weight change.  HENT: Negative for trouble swallowing.  Eyes: Negative for loss of vision.  Respiratory: Negative for cough, shortness of breath and wheezing.  Cardiovascular: Negative for chest pain, leg swelling, palpitations and syncope.  GI: Negative for abdominal pain, blood in stool, diarrhea, nausea and vomiting.  GU: Negative for difficulty urinating, dysuria, frequency and hematuria.  Musculoskeletal: Negative for back pain, leg pain and joint pain.  Skin: Negative for rash.  Neurological: Negative for dizziness, headaches, light-headedness, numbness and seizures.  Psychiatric: Negative for behavioral problem, confusion, depressed mood and sleep disturbance.        Objective:    Objective   Vitals:   09/20/19 1428  Weight: 168 lb (76.2 kg)  Height: _0  (1.626 m)   Body mass index is 28.84 kg/m.  Physical Exam Vitals and nursing note reviewed.  Constitutional:      Appearance: She is well-developed.  HENT:     Head: Normocephalic and atraumatic.  Eyes:     Pupils: Pupils are equal, round, and reactive to light.  Cardiovascular:     Rate and Rhythm: Normal rate and regular rhythm.  Pulmonary:     Effort: Pulmonary effort is normal. No respiratory distress.  Genitourinary:    Comments: External: Normal appearing vulva. No lesions noted.  Speculum examination: Normal appearing cervix. Small BRB at the cervical  os. Silver nitrate applied. No blood in the vaginal vault. Scant white discharge.   Bimanual examination: Uterus midline, non-tender, normal in size, shape and contour.  No CMT. No adnexal masses. No adnexal tenderness. Pelvis not fixed.  Skin:    General: Skin is warm and dry.  Neurological:     Mental Status: She is alert and oriented to person, place, and time.  Psychiatric:        Behavior: Behavior normal.        Thought Content: Thought content normal.        Judgment: Judgment normal.   Applied small amount of silver nitrate to cervix which resolved bleeding.     Assessment/Plan:     44 yo with persistent spotting after periods.  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  Silver nitrate applied to cervix. [place] was not suggestive of fibroid or polyp. Uterus normal size and shape.  Can consider hysteroscopy, SIS, biopsy if bleeding continues.  Negative for BV and yeast on previous testing. Can consider GC/CT/Trich testing if this treatment does not resolve symptoms. Patient has low level of concern for these infections. Last pap smear normal.  Right ovarian cyst likely physiologic, does not need further follow up or repeat imaging.   More than 45 minutes were spent face to face with the patient in the room, and reviewing her medical  records, with more than 50% of the time spent providing counseling and discussing the plan of management. We discussed.      Adrian Prows MD [facility] 09/20/2019 5:03 PM

## 2019-10-06 ENCOUNTER — Encounter: Payer: BC Managed Care – PPO | Admitting: Family Medicine

## 2019-10-13 ENCOUNTER — Other Ambulatory Visit: Payer: Self-pay

## 2019-10-13 ENCOUNTER — Ambulatory Visit: Payer: BC Managed Care – PPO | Admitting: Family Medicine

## 2019-10-13 ENCOUNTER — Encounter: Payer: Self-pay | Admitting: Family Medicine

## 2019-10-13 VITALS — BP 118/76 | HR 78 | Temp 98.2°F | Ht 65.0 in | Wt 167.2 lb

## 2019-10-13 DIAGNOSIS — K219 Gastro-esophageal reflux disease without esophagitis: Secondary | ICD-10-CM

## 2019-10-13 DIAGNOSIS — F329 Major depressive disorder, single episode, unspecified: Secondary | ICD-10-CM

## 2019-10-13 DIAGNOSIS — G5603 Carpal tunnel syndrome, bilateral upper limbs: Secondary | ICD-10-CM | POA: Diagnosis not present

## 2019-10-13 DIAGNOSIS — F419 Anxiety disorder, unspecified: Secondary | ICD-10-CM | POA: Diagnosis not present

## 2019-10-13 DIAGNOSIS — I10 Essential (primary) hypertension: Secondary | ICD-10-CM

## 2019-10-13 MED ORDER — SERTRALINE HCL 100 MG PO TABS: 100.0000 mg | ORAL_TABLET | Freq: Every day | ORAL | 3 refills | Status: DC

## 2019-10-13 NOTE — Assessment & Plan Note (Signed)
Controlled. Cont lisinopril. Labs in october

## 2019-10-13 NOTE — Patient Instructions (Addendum)
Great to meet you!  Continue current medications   Follow-up for annual physical when due

## 2019-10-13 NOTE — Assessment & Plan Note (Signed)
Improved. Pt decreasing zoloft w/o need for additional xanax. Currenly xanax <1 time per week. Encouraged monitoring and cont medication.

## 2019-10-13 NOTE — Assessment & Plan Note (Signed)
Unable to tolerate not taking PPI. Discussed bone health risk. Cont omeprazole. Consider occasional breaks

## 2019-10-13 NOTE — Assessment & Plan Note (Signed)
Notes numbness/tingling in b/l 3rd/4th digit with positive wrist tinel sign. Advised trial of b12 pill (she wondered if this would help) and night time brace to see if improvement.

## 2019-10-13 NOTE — Progress Notes (Signed)
Subjective:     Catherine Gilbert is a 44 y.o. female presenting for Transfer of Care (from Dr Deborra Medina)     HPI   #anxiety - sleep is improved - taking xanax <1 time per week -   Wondering about b12 Hands are falling asleep and tingling Eats meat Takes omeprazole daily  Review of Systems   Social History   Tobacco Use  Smoking Status Former Smoker  . Packs/day: 1.00  . Years: 25.00  . Pack years: 25.00  . Types: Cigarettes  . Quit date: 11/22/2015  . Years since quitting: 3.8  Smokeless Tobacco Never Used        Objective:    BP Readings from Last 3 Encounters:  10/13/19 118/76  08/09/19 112/74  07/26/19 (!) 156/88   Wt Readings from Last 3 Encounters:  10/13/19 167 lb 4 oz (75.9 kg)  09/20/19 168 lb (76.2 kg)  08/09/19 170 lb 9.6 oz (77.4 kg)    BP 118/76   Pulse 78   Temp 98.2 F (36.8 C)   Ht 5\' 5"  (1.651 m)   Wt 167 lb 4 oz (75.9 kg)   LMP 09/21/2019   SpO2 98%   BMI 27.83 kg/m    Physical Exam Constitutional:      General: She is not in acute distress.    Appearance: She is well-developed. She is not diaphoretic.  HENT:     Right Ear: External ear normal.     Left Ear: External ear normal.  Eyes:     Conjunctiva/sclera: Conjunctivae normal.  Cardiovascular:     Rate and Rhythm: Normal rate.  Pulmonary:     Effort: Pulmonary effort is normal.  Musculoskeletal:     Cervical back: Neck supple.     Comments: Elbow - neg tinel Wrist - b/l tinel with 3rd digit, neg phallen. Normal ROM  Skin:    General: Skin is warm and dry.     Capillary Refill: Capillary refill takes less than 2 seconds.  Neurological:     Mental Status: She is alert. Mental status is at baseline.  Psychiatric:        Mood and Affect: Mood normal.        Behavior: Behavior normal.      The 10-year ASCVD risk score Mikey Bussing DC Jr., et al., 2013) is: 1.6%   Values used to calculate the score:     Age: 13 years     Sex: Female     Is Non-Hispanic African American:  No     Diabetic: No     Tobacco smoker: No     Systolic Blood Pressure: 277 mmHg     Is BP treated: Yes     HDL Cholesterol: 38 mg/dL     Total Cholesterol: 201 mg/dL      Assessment & Plan:   Problem List Items Addressed This Visit      Cardiovascular and Mediastinum   Essential hypertension - Primary    Controlled. Cont lisinopril. Labs in october        Digestive   GERD (gastroesophageal reflux disease)    Unable to tolerate not taking PPI. Discussed bone health risk. Cont omeprazole. Consider occasional breaks        Nervous and Auditory   Bilateral carpal tunnel syndrome    Notes numbness/tingling in b/l 3rd/4th digit with positive wrist tinel sign. Advised trial of b12 pill (she wondered if this would help) and night time brace to see if improvement.  Relevant Medications   sertraline (ZOLOFT) 100 MG tablet     Other   Anxiety and depression    Improved. Pt decreasing zoloft w/o need for additional xanax. Currenly xanax <1 time per week. Encouraged monitoring and cont medication.       Relevant Medications   sertraline (ZOLOFT) 100 MG tablet       Return in about 6 months (around 04/13/2020) for labs and annual.  Lesleigh Noe, MD

## 2019-10-17 ENCOUNTER — Ambulatory Visit: Payer: BC Managed Care – PPO | Admitting: Obstetrics and Gynecology

## 2019-11-09 ENCOUNTER — Encounter: Payer: Self-pay | Admitting: Obstetrics and Gynecology

## 2019-11-09 ENCOUNTER — Ambulatory Visit (INDEPENDENT_AMBULATORY_CARE_PROVIDER_SITE_OTHER): Payer: BC Managed Care – PPO | Admitting: Obstetrics and Gynecology

## 2019-11-09 ENCOUNTER — Other Ambulatory Visit: Payer: Self-pay

## 2019-11-09 VITALS — BP 100/60 | Ht 64.0 in | Wt 171.0 lb

## 2019-11-09 DIAGNOSIS — N923 Ovulation bleeding: Secondary | ICD-10-CM

## 2019-11-09 DIAGNOSIS — N760 Acute vaginitis: Secondary | ICD-10-CM | POA: Diagnosis not present

## 2019-11-09 DIAGNOSIS — Z113 Encounter for screening for infections with a predominantly sexual mode of transmission: Secondary | ICD-10-CM

## 2019-11-09 NOTE — Patient Instructions (Signed)
Sonohysterogram  A sonohysterogram is a procedure to examine the inside of the uterus. This exam uses sound waves that are sent to a computer to make images of the lining of the uterus (endometrium). To get the best images, a germ-free, salt-water solution (sterile saline) is put into the uterus through the vagina. You may have this procedure if you have certain reproductive problems, such as abnormal bleeding, infertility, or miscarriage. This procedure can show what may be causing these problems. Possible causes include scarring or abnormal growths such as fibroids inside your uterus. It can also show if your uterus is an abnormal shape or if the lining of the uterus is too thin. Tell a health care provider about:  All medicines you are taking, including vitamins, herbs, eye drops, creams, and over-the-counter medicines.  Any allergies you have.  Any blood disorders you have.  Any surgeries you have had.  Any medical conditions you have.  Whether you are pregnant or may be pregnant.  The date of the first day of your last period.  Any signs of infection, such as fever, pain in your lower abdomen, or abnormal discharge from your vagina. What are the risks? Generally, this is a safe procedure. However, problems may occur, including:  Abdominal pain or cramping.  Light bleeding (spotting).  Increased vaginal discharge.  Infection. What happens before the procedure?  Your health care provider may have you take an over-the-counter pain medicine.  You may be given medicine to stop any abnormal bleeding.  You may be given antibiotic medicine to help prevent infection.  You may be asked to take a pregnancy test. This is usually in the form of a urine test.  You may have a pelvic exam.  You will be asked to empty your bladder. What happens during the procedure?  You will lie down on the exam table with your feet in stirrups or with your knees bent and your feet flat on the  table.  A slender, handheld device (transducer) will be lubricated and placed into your vagina.  The transducer will be positioned to send sound waves to your uterus. The sound waves are sent to a computer and are turned into images, which your health care provider sees during the procedure.  The transducer will be removed from your vagina.  An instrument will be inserted to widen the opening of your vagina (speculum).  A swab with germ-killing solution (antiseptic) will be used to clean the opening to your uterus (cervix).  A long, thin tube (catheter) will be placed through your cervix into your uterus.  The speculum will be removed.  The transducer will be placed back into your vagina to take more images.  Your uterus will be filled with a germ-free, salt-water solution (sterile saline) through the catheter. You may feel some cramping.  A fluid that contains air bubbles may be sent through the catheter to make it easier to see the fallopian tubes.  The transducer and catheter will be removed. The procedure may vary among health care providers and hospitals. What happens after the procedure?  It is up to you to get the results of your procedure. Ask your health care provider, or the department that is doing the procedure, when your results will be ready. Summary  A sonohysterogram is a procedure that creates images of the inside of the uterus.  The risks of this procedure are very low. Most women experience cramping and spotting after the procedure.  You may need to have  a pelvic exam and take a pregnancy test before this procedure. This procedure will not be done if you are pregnant or have an infection. This information is not intended to replace advice given to you by your health care provider. Make sure you discuss any questions you have with your health care provider. Document Revised: 05/29/2017 Document Reviewed: 05/12/2016 Elsevier Patient Education  2020 [employer].

## 2019-11-09 NOTE — Progress Notes (Signed)
Patient ID: Catherine Gilbert, female   DOB: [birth year], 44 y.o.   MRN: [id number]  Reason for Consult: Follow-up (still spotting after cycle)   Referred by Lesleigh Noe, MD  Subjective:     HPI:  Catherine Gilbert is a 44 y.o. female.  She reports that she has continued to have some spotting and bleeding after her menstrual cycle.  Last time she was seen silver nitrate was applied to her cervix to help with a friable appearance.  The follow-up plan was discussed and she is here to continue evaluation.  She has not noticed any new symptoms.  She reports that her last period lasted for 10 days with 3 to 4 days of spotting after the menstrual cycle concluded.   Past Medical History:  Diagnosis Date  . Abnormal Pap smear   . Anemia   . Anxiety   . Anxiety state 04/23/2011  . Cancer ([place]) 02/2018   lung rt  . Depression   . Dysmenorrhea 08/27/2011  . Dyspnea on exertion 10/30/2015  . Essential hypertension 09/15/2012  . Family history of ASCVD (arteriosclerotic cardiovascular disease) 12/31/2016  . Family history of premature CAD 11/11/2016  . GAD (generalized anxiety disorder) 10/30/2015  . GERD (gastroesophageal reflux disease) 05/29/2014  . Headache 01/01/2017  . HSV (herpes simplex virus) infection   . Hypertension   . Initiation of Depo Provera 09/24/2011  . Lung nodule    RLL on coronary calcium score CT  . Migraine headache    Family History  Problem Relation Age of Onset  . Stroke Mother 73       massive stroke  . Heart disease Father 70       congestive heart failure  . Hypertension Father   . Heart disease Brother 38       Suspected MI and SCD  . Heart disease Sister   . Stroke Sister 62  . Breast cancer Maternal Grandmother 85  . Breast cancer Cousin        x2 paternal, both alive   Past Surgical History:  Procedure Laterality Date  . BREAST CYST ASPIRATION Right 02/10/2018   neg  . CESAREAN SECTION     x 3   . COLPOSCOPY  2010  . DILATION AND CURETTAGE OF UTERUS     . FLEXIBLE BRONCHOSCOPY N/A 03/29/2018   Procedure: PREOP  BRONCHOSCOPY;  Surgeon: Nestor Lewandowsky, MD;  Location: [facility]S;  Service: Thoracic;  Laterality: N/A;  . THORACOTOMY Right 03/29/2018   Procedure: THORACOTOMY MAJOR- POSSIBLE LOBECTOMY;  Surgeon: Nestor Lewandowsky, MD;  Location: [facility];  Service: Thoracic;  Laterality: Right;  . TUBAL LIGATION    . VIDEO ASSISTED THORACOSCOPY (VATS)/WEDGE RESECTION Right 03/29/2018   Procedure: VIDEO ASSISTED THORACOSCOPY (VATS)/WEDGE RESECTION;  Surgeon: Nestor Lewandowsky, MD;  Location: [facility];  Service: Thoracic;  Laterality: Right;    Short Social History:  Social History   Tobacco Use  . Smoking status: Former Smoker    Packs/day: 1.00    Years: 25.00    Pack years: 25.00    Types: Cigarettes    Quit date: 11/22/2015    Years since quitting: 3.9  . Smokeless tobacco: Never Used  Substance Use Topics  . Alcohol use: Yes    Comment: rare    No Known Allergies  Current Outpatient Medications  Medication Sig Dispense Refill  . ALPRAZolam (XANAX) 0.5 MG tablet Take 1 tablet (0.5 mg total) by mouth at bedtime as needed. 30 tablet 5  .  Aspirin-Caffeine (BC FAST PAIN RELIEF PO) Take 1 packet by mouth daily as needed (headache).    . ferrous sulfate (FEROSUL) 325 (65 FE) MG tablet TAKE 1 TABLET(325 MG) BY MOUTH TWICE DAILY WITH A MEAL 180 tablet 3  . lisinopril (ZESTRIL) 10 MG tablet Take 1/2-1 qd 90 tablet 3  . Multiple Vitamin (MULTIVITAMIN WITH MINERALS) TABS tablet Take 2 tablets by mouth daily. Juice Plus fruit and vegetables    . omeprazole (PRILOSEC) 40 MG capsule TAKE 1 CAPSULE(40 MG) BY MOUTH EVERY MORNING 90 capsule 3  . Phenylephrine-APAP-guaiFENesin (TYLENOL SINUS SEVERE PO) Take 2 tablets by mouth daily as needed (sinus headaches).    . sertraline (ZOLOFT) 100 MG tablet Take 1 tablet (100 mg total) by mouth daily. 90 tablet 3  . valACYclovir (VALTREX) 500 MG tablet TAKE 2 TABLETS BY MOUTH TODAY, THEN 2 TABLETS TWICE DAILY AS NEEDED.  60 tablet 5   No current facility-administered medications for this visit.    Review of Systems  Constitutional: Negative for chills, fatigue, fever and unexpected weight change.  HENT: Negative for trouble swallowing.  Eyes: Negative for loss of vision.  Respiratory: Negative for cough, shortness of breath and wheezing.  Cardiovascular: Negative for chest pain, leg swelling, palpitations and syncope.  GI: Negative for abdominal pain, blood in stool, diarrhea, nausea and vomiting.  GU: Negative for difficulty urinating, dysuria, frequency and hematuria.  Musculoskeletal: Negative for back pain, leg pain and joint pain.  Skin: Negative for rash.  Neurological: Negative for dizziness, headaches, light-headedness, numbness and seizures.  Psychiatric: Negative for behavioral problem, confusion, depressed mood and sleep disturbance.        Objective:  Objective   Vitals:   11/09/19 1613  BP: 100/60  Weight: 171 lb (77.6 kg)  Height: _0  (1.626 m)   Body mass index is 29.35 kg/m.  Physical Exam Vitals and nursing note reviewed.  Constitutional:      Appearance: She is well-developed.  HENT:     Head: Normocephalic and atraumatic.  Eyes:     Pupils: Pupils are equal, round, and reactive to light.  Cardiovascular:     Rate and Rhythm: Normal rate and regular rhythm.  Pulmonary:     Effort: Pulmonary effort is normal. No respiratory distress.  Genitourinary:    Comments: External: Normal appearing vulva. No lesions noted.  Speculum examination: Normal appearing cervix. No blood in the vaginal vault. White discharge.    Skin:    General: Skin is warm and dry.  Neurological:     Mental Status: She is alert and oriented to person, place, and time.  Psychiatric:        Behavior: Behavior normal.        Thought Content: Thought content normal.        Judgment: Judgment normal.          Assessment/Plan:     44 year old with bothersome intermenstrual spotting. Will  check patient's vaginitis.  New swab sent for testing. We will have patient follow-up for saline infusion sonohysterogram.  Discussed this process with patient.  Recommended scheduling between cycle days 01/26/2013. Can consider medical management options if needed for evaluation of extended cycle bleeding continues to be negative.  More than 20 minutes were spent face to face with the patient in the room, reviewing the medical record, labs and images, and coordinating care for the patient. The plan of management was discussed in detail and counseling was provided.   Adrian Prows MD [facility], [facility] 11/09/2019 4:39 PM

## 2019-11-10 DIAGNOSIS — N760 Acute vaginitis: Secondary | ICD-10-CM | POA: Diagnosis not present

## 2019-11-10 DIAGNOSIS — Z113 Encounter for screening for infections with a predominantly sexual mode of transmission: Secondary | ICD-10-CM | POA: Diagnosis not present

## 2019-11-13 LAB — NUSWAB VAGINITIS PLUS (VG+)
Candida albicans, NAA: NEGATIVE
Candida glabrata, NAA: NEGATIVE
Chlamydia trachomatis, NAA: NEGATIVE
Neisseria gonorrhoeae, NAA: NEGATIVE
Trich vag by NAA: NEGATIVE

## 2019-11-17 ENCOUNTER — Telehealth: Payer: Self-pay | Admitting: Obstetrics and Gynecology

## 2019-11-17 NOTE — Telephone Encounter (Signed)
Scheduled pt for Sonohystergram with [facility] on 5/25. LMP 5/19 - spotting 5/18  U/S @ 1:00  [facility] to arrive to room at 1:30 for procedure.  Pt is aware of appt.

## 2019-11-22 ENCOUNTER — Ambulatory Visit (INDEPENDENT_AMBULATORY_CARE_PROVIDER_SITE_OTHER): Payer: BC Managed Care – PPO

## 2019-11-22 ENCOUNTER — Other Ambulatory Visit: Payer: Self-pay | Admitting: Obstetrics and Gynecology

## 2019-11-22 ENCOUNTER — Encounter: Payer: Self-pay | Admitting: Obstetrics and Gynecology

## 2019-11-22 ENCOUNTER — Other Ambulatory Visit: Payer: Self-pay

## 2019-11-22 ENCOUNTER — Ambulatory Visit (INDEPENDENT_AMBULATORY_CARE_PROVIDER_SITE_OTHER): Payer: BC Managed Care – PPO | Admitting: Obstetrics and Gynecology

## 2019-11-22 DIAGNOSIS — N939 Abnormal uterine and vaginal bleeding, unspecified: Secondary | ICD-10-CM

## 2019-11-22 DIAGNOSIS — N923 Ovulation bleeding: Secondary | ICD-10-CM | POA: Diagnosis not present

## 2019-11-22 NOTE — Progress Notes (Unsigned)
Patient ID: Catherine Gilbert, female   DOB: Oct 01, 1975, 44 y.o.   MRN: [id number]  Reason for Consult: sonohysterogram   Referred by Lesleigh Noe, MD  Subjective:     HPI:  Catherine Gilbert is a 44 y.o. female.  She presents today for saline infusion sonohysterogram.  Findings of the saline infusion sonohysterogram showed no intrauterine polyp or fibroid.  Endometrium was slightly thickened for this point of her menstrual cycle.  Patient reports that her menstrual cycle started last week approximately 7 days ago.  She is still having a small amount of brown bleeding on exam today.  Endometrial biopsy was attempted at the time of the saline infusion sonohysterogram but adequate tissue sample was not obtained.  Patient was offered to return next week for endometrial biopsy in office however she would prefer to have a hysteroscopy D&C to sample the endometrium secondary to discomfort with with the office procedure.   Past Medical History:  Diagnosis Date  . Abnormal Pap smear   . Anemia   . Anxiety   . Anxiety state 04/23/2011  . Cancer ([place]) 02/2018   lung rt  . Depression   . Dysmenorrhea 08/27/2011  . Dyspnea on exertion 10/30/2015  . Essential hypertension 09/15/2012  . Family history of ASCVD (arteriosclerotic cardiovascular disease) 12/31/2016  . Family history of premature CAD 11/11/2016  . GAD (generalized anxiety disorder) 10/30/2015  . GERD (gastroesophageal reflux disease) 05/29/2014  . Headache 01/01/2017  . HSV (herpes simplex virus) infection   . Hypertension   . Initiation of Depo Provera 09/24/2011  . Lung nodule    RLL on coronary calcium score CT  . Migraine headache    Family History  Problem Relation Age of Onset  . Stroke Mother 48       massive stroke  . Heart disease Father 65       congestive heart failure  . Hypertension Father   . Heart disease Brother 49       Suspected MI and SCD  . Heart disease Sister   . Stroke Sister 24  . Breast cancer Maternal  Grandmother 87  . Breast cancer Cousin        x2 paternal, both alive   Past Surgical History:  Procedure Laterality Date  . BREAST CYST ASPIRATION Right 02/10/2018   neg  . CESAREAN SECTION     x 3   . COLPOSCOPY  2010  . DILATION AND CURETTAGE OF UTERUS    . FLEXIBLE BRONCHOSCOPY N/A 03/29/2018   Procedure: PREOP  BRONCHOSCOPY;  Surgeon: Nestor Lewandowsky, MD;  Location: [facility] ORS;  Service: Thoracic;  Laterality: N/A;  . THORACOTOMY Right 03/29/2018   Procedure: THORACOTOMY MAJOR- POSSIBLE LOBECTOMY;  Surgeon: Nestor Lewandowsky, MD;  Location: [facility] ORS;  Service: Thoracic;  Laterality: Right;  . TUBAL LIGATION    . VIDEO ASSISTED THORACOSCOPY (VATS)/WEDGE RESECTION Right 03/29/2018   Procedure: VIDEO ASSISTED THORACOSCOPY (VATS)/WEDGE RESECTION;  Surgeon: Nestor Lewandowsky, MD;  Location: [facility] ORS;  Service: Thoracic;  Laterality: Right;    Short Social History:  Social History   Tobacco Use  . Smoking status: Former Smoker    Packs/day: 1.00    Years: 25.00    Pack years: 25.00    Types: Cigarettes    Quit date: 11/22/2015    Years since quitting: 4.0  . Smokeless tobacco: Never Used  Substance Use Topics  . Alcohol use: Yes    Comment: rare    No Known Allergies  Current  Outpatient Medications  Medication Sig Dispense Refill  . ALPRAZolam (XANAX) 0.5 MG tablet Take 1 tablet (0.5 mg total) by mouth at bedtime as needed. 30 tablet 5  . Aspirin-Caffeine (BC FAST PAIN RELIEF PO) Take 1 packet by mouth daily as needed (headache).    . ferrous sulfate (FEROSUL) 325 (65 FE) MG tablet TAKE 1 TABLET(325 MG) BY MOUTH TWICE DAILY WITH A MEAL 180 tablet 3  . lisinopril (ZESTRIL) 10 MG tablet Take 1/2-1 qd 90 tablet 3  . Multiple Vitamin (MULTIVITAMIN WITH MINERALS) TABS tablet Take 2 tablets by mouth daily. Juice Plus fruit and vegetables    . omeprazole (PRILOSEC) 40 MG capsule TAKE 1 CAPSULE(40 MG) BY MOUTH EVERY MORNING 90 capsule 3  . Phenylephrine-APAP-guaiFENesin (TYLENOL SINUS SEVERE  PO) Take 2 tablets by mouth daily as needed (sinus headaches).    . sertraline (ZOLOFT) 100 MG tablet Take 1 tablet (100 mg total) by mouth daily. 90 tablet 3  . valACYclovir (VALTREX) 500 MG tablet TAKE 2 TABLETS BY MOUTH TODAY, THEN 2 TABLETS TWICE DAILY AS NEEDED. 60 tablet 5   No current facility-administered medications for this visit.    Review of Systems  Constitutional: Negative for chills, fatigue, fever and unexpected weight change.  HENT: Negative for trouble swallowing.  Eyes: Negative for loss of vision.  Respiratory: Negative for cough, shortness of breath and wheezing.  Cardiovascular: Negative for chest pain, leg swelling, palpitations and syncope.  GI: Negative for abdominal pain, blood in stool, diarrhea, nausea and vomiting.  GU: Negative for difficulty urinating, dysuria, frequency and hematuria.  Musculoskeletal: Negative for back pain, leg pain and joint pain.  Skin: Negative for rash.  Neurological: Negative for dizziness, headaches, light-headedness, numbness and seizures.  Psychiatric: Negative for behavioral problem, confusion, depressed mood and sleep disturbance.       Objective:  Objective   Vitals:   11/22/19 1352  BP: 118/70  Weight: 163 lb (73.9 kg)  Height: _0  (1.626 m)   Body mass index is 27.98 kg/m.  Physical Exam Vitals and nursing note reviewed.  Constitutional:      Appearance: She is well-developed.  HENT:     Head: Normocephalic and atraumatic.  Eyes:     Pupils: Pupils are equal, round, and reactive to light.  Cardiovascular:     Rate and Rhythm: Normal rate and regular rhythm.  Pulmonary:     Effort: Pulmonary effort is normal. No respiratory distress.  Genitourinary:    General: Normal vulva.  Skin:    General: Skin is warm and dry.  Neurological:     Mental Status: She is alert and oriented to person, place, and time.  Psychiatric:        Behavior: Behavior normal.        Thought Content: Thought content normal.          Judgment: Judgment normal.   Sonohysterogram Procedure Note  The indications for this procedure were reviewed with the patient. The procedure was explained in detail and all questions were answered.  The patient was placed in the lithotomy position. A graves speculum was introduced into the vagina and the cervix was visualized. The cervix was prepped with iodine solution. A Cook's Hysterography catheter was then introduced into the uterine cavity and the speculum was removed.   Sterile sonohysterography with 3D Reconstruction was performed. The endometrial cavity was distended with sterile saline. The findings are as follows: no endometrial polyps or fibroids seen. Endometrium slightly thick for first week of menstrual  cycle.   The patient tolerated the procedure well without complication, and was discharged to home.      Assessment/Plan:    44 year old with abnormal uterine bleeding and prolonged menstrual cycle. Patient has had normal infection testing and sent saline infusion sonohysterogram today did not suggest intrauterine pathology.  Will plan for the patient to undergo hysteroscopy D&C in the near future.  If the pathology results of tissue are normal will start patient on hormonal birth control to help regulate menstrual cycle.  More than 25 minutes were spent face to face with the patient in the room, reviewing the medical record, labs and images, and coordinating care for the patient. The plan of management was discussed in detail and counseling was provided.    Adrian Prows MD [facility], [facility] 11/22/2019 2:25 PM

## 2019-11-22 NOTE — Progress Notes (Signed)
Patient ID: Catherine Gilbert, female   DOB: Sep 08, 1975, 44 y.o.   MRN: [id number]  Reason for Consult: No chief complaint on file.   Referred by Lesleigh Noe, MD  Subjective:     HPI:  Catherine Gilbert is a 44 y.o. female.  Patient presents today for a saline infusion sonohysterogram.  Results of the saline infusion sonohysterogram showed a normal endometrial cavity with no polyps or fibroids.  Endometrial biopsy was attempted but not successful.  Patient reports the first day of her last menstrual period was 11/16/2019 however patient's endometrium today is thickened given her recent menstrual cycle.  Infection testing has been negative.  Given the thickened appearance of her endometrium despite her recent menstrual cycle recommended to the patient sampling of the endometrium.  She was uncomfortable with today's endometrial biopsy attempts and would like to follow-up for hysteroscopy D&C in the near future.   Past Medical History:  Diagnosis Date  . Abnormal Pap smear   . Anemia   . Anxiety   . Anxiety state 04/23/2011  . Cancer ([place]) 02/2018   lung rt  . Depression   . Dysmenorrhea 08/27/2011  . Dyspnea on exertion 10/30/2015  . Essential hypertension 09/15/2012  . Family history of ASCVD (arteriosclerotic cardiovascular disease) 12/31/2016  . Family history of premature CAD 11/11/2016  . GAD (generalized anxiety disorder) 10/30/2015  . GERD (gastroesophageal reflux disease) 05/29/2014  . Headache 01/01/2017  . HSV (herpes simplex virus) infection   . Hypertension   . Initiation of Depo Provera 09/24/2011  . Lung nodule    RLL on coronary calcium score CT  . Migraine headache    Family History  Problem Relation Age of Onset  . Stroke Mother 14       massive stroke  . Heart disease Father 59       congestive heart failure  . Hypertension Father   . Heart disease Brother 84       Suspected MI and SCD  . Heart disease Sister   . Stroke Sister 57  . Breast cancer Maternal  Grandmother 31  . Breast cancer Cousin        x2 paternal, both alive   Past Surgical History:  Procedure Laterality Date  . BREAST CYST ASPIRATION Right 02/10/2018   neg  . CESAREAN SECTION     x 3   . COLPOSCOPY  2010  . DILATION AND CURETTAGE OF UTERUS    . FLEXIBLE BRONCHOSCOPY N/A 03/29/2018   Procedure: PREOP  BRONCHOSCOPY;  Surgeon: Nestor Lewandowsky, MD;  Location: [facility];  Service: Thoracic;  Laterality: N/A;  . THORACOTOMY Right 03/29/2018   Procedure: THORACOTOMY MAJOR- POSSIBLE LOBECTOMY;  Surgeon: Nestor Lewandowsky, MD;  Location: [facility];  Service: Thoracic;  Laterality: Right;  . TUBAL LIGATION    . VIDEO ASSISTED THORACOSCOPY (VATS)/WEDGE RESECTION Right 03/29/2018   Procedure: VIDEO ASSISTED THORACOSCOPY (VATS)/WEDGE RESECTION;  Surgeon: Nestor Lewandowsky, MD;  Location: [facility];  Service: Thoracic;  Laterality: Right;    Short Social History:  Social History   Tobacco Use  . Smoking status: Former Smoker    Packs/day: 1.00    Years: 25.00    Pack years: 25.00    Types: Cigarettes    Quit date: 11/22/2015    Years since quitting: 4.0  . Smokeless tobacco: Never Used  Substance Use Topics  . Alcohol use: Yes    Comment: rare    No Known Allergies  Current Outpatient Medications  Medication Sig  Dispense Refill  . ALPRAZolam (XANAX) 0.5 MG tablet Take 1 tablet (0.5 mg total) by mouth at bedtime as needed. 30 tablet 5  . Aspirin-Caffeine (BC FAST PAIN RELIEF PO) Take 1 packet by mouth daily as needed (headache).    . ferrous sulfate (FEROSUL) 325 (65 FE) MG tablet TAKE 1 TABLET(325 MG) BY MOUTH TWICE DAILY WITH A MEAL 180 tablet 3  . lisinopril (ZESTRIL) 10 MG tablet Take 1/2-1 qd 90 tablet 3  . Multiple Vitamin (MULTIVITAMIN WITH MINERALS) TABS tablet Take 2 tablets by mouth daily. Juice Plus fruit and vegetables    . omeprazole (PRILOSEC) 40 MG capsule TAKE 1 CAPSULE(40 MG) BY MOUTH EVERY MORNING 90 capsule 3  . Phenylephrine-APAP-guaiFENesin (TYLENOL SINUS SEVERE  PO) Take 2 tablets by mouth daily as needed (sinus headaches).    . sertraline (ZOLOFT) 100 MG tablet Take 1 tablet (100 mg total) by mouth daily. 90 tablet 3  . valACYclovir (VALTREX) 500 MG tablet TAKE 2 TABLETS BY MOUTH TODAY, THEN 2 TABLETS TWICE DAILY AS NEEDED. 60 tablet 5   No current facility-administered medications for this visit.    Review of Systems  Constitutional: Negative for chills, fatigue, fever and unexpected weight change.  HENT: Negative for trouble swallowing.  Eyes: Negative for loss of vision.  Respiratory: Negative for cough, shortness of breath and wheezing.  Cardiovascular: Negative for chest pain, leg swelling, palpitations and syncope.  GI: Negative for abdominal pain, blood in stool, diarrhea, nausea and vomiting.  GU: Negative for difficulty urinating, dysuria, frequency and hematuria.  Musculoskeletal: Negative for back pain, leg pain and joint pain.  Skin: Negative for rash.  Neurological: Negative for dizziness, headaches, light-headedness, numbness and seizures.  Psychiatric: Negative for behavioral problem, confusion, depressed mood and sleep disturbance.        Objective:  Objective   There were no vitals filed for this visit. There is no height or weight on file to calculate BMI.  Physical Exam Vitals and nursing note reviewed. Exam conducted with a chaperone present.  Constitutional:      Appearance: She is well-developed.  HENT:     Head: Normocephalic and atraumatic.  Eyes:     Pupils: Pupils are equal, round, and reactive to light.  Cardiovascular:     Rate and Rhythm: Normal rate and regular rhythm.  Pulmonary:     Effort: Pulmonary effort is normal. No respiratory distress.  Genitourinary:    General: Normal vulva.     Vagina: Normal.     Cervix: Normal.  Skin:    General: Skin is warm and dry.  Neurological:     Mental Status: She is alert and oriented to person, place, and time.  Psychiatric:        Behavior: Behavior  normal.        Thought Content: Thought content normal.        Judgment: Judgment normal.    Sonohysterogram Procedure Note  The indications for this procedure were reviewed with the patient. The procedure was explained in detail and all questions were answered.  The patient was placed in the lithotomy position. A graves speculum was introduced into the vagina and the cervix was visualized. The cervix was prepped with iodine solution. A Cook's Hysterography catheter was then introduced into the uterine cavity and the speculum was removed.   Sterile sonohysterography with 3D Reconstruction was performed. The endometrial cavity was distended with sterile saline. The findings are as follows: No endometrial polyps or submucosal fibroids identified.  Endometrium  thick for stage of menstrual cycle.  The patient tolerated the procedure well without complication, and was discharged to home.       Assessment/Plan:     44 year old with abnormal uterine bleeding and prolonged menstrual cycle. Patient will follow up for a hysteroscopy D&C in the near future.  If results of complete evaluation are negative she will consider hormonal management of her menstrual cycle.  Options progesterone only pill and IUD discussed with patient.  More than 25 minutes were spent face to face with the patient in the room, reviewing the medical record, labs and images, and coordinating care for the patient. The plan of management was discussed in detail and counseling was provided.      Adrian Prows MD [facility], [facility] 11/22/2019 4:20 PM

## 2019-11-30 ENCOUNTER — Telehealth: Payer: Self-pay | Admitting: Obstetrics and Gynecology

## 2019-11-30 NOTE — Telephone Encounter (Signed)
-----   Message from Homero Fellers, MD sent at 11/22/2019  2:31 PM EDT ----- Surgery Booking Request Patient Full Name:  Catherine Gilbert  MRN: [id number]  DOB: 05-28-1976  Surgeon: Homero Fellers, MD  Requested Surgery Date and Time: in next 1-4 weeks Primary Diagnosis AND Code: Abnormal uterine bleeding Secondary Diagnosis and Code:  Surgical Procedure: Hysteroscopy D&C L&D Notification: No Admission Status: same day surgery Length of Surgery: 1 hour Special Case Needs: No H&P: No Phone Interview???:  Yes Interpreter: No Language:  Medical Clearance:  No Special Scheduling Instructions: none Any known health/anesthesia issues, diabetes, sleep apnea, latex allergy, defibrillator/pacemaker?: No Acuity: P3   (P1 highest, P2 delay may cause harm, P3 low, elective gyn, P4 lowest)

## 2019-11-30 NOTE — Telephone Encounter (Signed)
Called to schedule Hysteroscopy with [facility]    DOS 7/15  H&P DOS per [facility]   Covid testing 7/13 @ 8-10:30am, [address], drive up and wear mask. Advised pt to quarantine until DOS.  Pre-admit phone call appointment to be requested - all appointments will be updated on pt MyChart. Explained that this appointment has a call window. Based on the time scheduled will indicate if the call will be received within a 4 hour window before 1:00 or after.  Advised that pt may also receive calls from the hospital pharmacy and pre-service center.  Confirmed pt has [employer] as Chartered certified accountant. No secondary insurance.

## 2019-12-30 ENCOUNTER — Encounter
Admission: RE | Admit: 2019-12-30 | Discharge: 2019-12-30 | Disposition: A | Discharge status: Home / Self Care | Payer: BC Managed Care – PPO | Source: Ambulatory Visit | Attending: Obstetrics and Gynecology | Admitting: Obstetrics and Gynecology

## 2019-12-30 ENCOUNTER — Other Ambulatory Visit: Payer: Self-pay

## 2019-12-30 NOTE — Patient Instructions (Signed)
COVID TESTING Date: July 13,2021 Tuesday Testing site:  [facility] [address] Thru Hours:  6:57 am - 1:00 pm Once you are tested, you are asked to stay quarantined (avoiding public places) until after your surgery.   Your procedure is scheduled on: January 12, 2020 THURSDAY Report to Day Surgery on the 2nd floor of the [facility]. To find out your arrival time, please call [phone] between 1PM - 3PM on: January 11, 2020 [facility]  REMEMBER: Instructions that are not followed completely may result in serious medical risk, up to and including death; or upon the discretion of your surgeon and anesthesiologist your surgery may need to be rescheduled.  Do not eat food after midnight the night before surgery.  No gum chewing, lozengers or hard candies.  You may however, drink CLEAR liquids up to 2 hours before you are scheduled to arrive for your surgery. Do not drink anything within 2 hours of your scheduled arrival time.  Clear liquids include: - water  - apple juice without pulp - gatorade (not RED) - black coffee or tea (Do NOT add milk or creamers to the coffee or tea) Do NOT drink anything that is not on this list.  Type 1 and Type 2 diabetics should only drink water.  ENSURE PRE-SURGERY CARBOHYDRATE DRINK:  Complete drinking 2 hours prior to scheduled arrival time.  TAKE THESE MEDICATIONS THE MORNING OF SURGERY WITH A SIP OF WATER: sertaline omeprazole  (take one the night before and one on the morning of surgery - helps to prevent nausea after surgery.)  Follow recommendations from Cardiologist, Pulmonologist or PCP regarding stopping Aspirin, Coumadin, Plavix, Eliquis, Pradaxa, or Pletal.  Stop Anti-inflammatories (NSAIDS) such as Advil, Aleve, Ibuprofen, Motrin, Naproxen, Naprosyn and Aspirin based products such as Excedrin, Goodys Powder, BC Powder. 10 days before surgery (May take Tylenol or Acetaminophen if needed.)  Stop ANY OVER THE  COUNTER supplements until after surgery. (May continue Vitamin D, Vitamin B, and multivitamin and ferrous sulfate.)  No Alcohol for 24 hours before or after surgery.  No Smoking including e-cigarettes for 24 hours prior to surgery.  No chewable tobacco products for at least 6 hours prior to surgery.  No nicotine patches on the day of surgery.  Do not use any "recreational" drugs for at least a week prior to your surgery.  Please be advised that the combination of cocaine and anesthesia may have negative outcomes, up to and including death. If you test positive for cocaine, your surgery will be cancelled.  On the morning of surgery brush your teeth with toothpaste and water, you may rinse your mouth with mouthwash if you wish. Do not swallow any toothpaste or mouthwash.  Do not wear jewelry, make-up, hairpins, clips or nail polish.  Do not wear lotions, powders, or perfumes.   Do not shave 48 hours prior to surgery.   Contact lenses, hearing aids and dentures may not be worn into surgery.  Do not bring valuables to the hospital. [facility] is not responsible for any missing/lost belongings or valuables.   Shower day of surgery.   Notify your doctor if there is any change in your medical condition (cold, fever, infection).  Wear comfortable clothing (specific to your surgery type) to the hospital.  Plan for stool softeners for home use; pain medications have a tendency to cause constipation. You can also help prevent constipation by eating foods high in fiber such as fruits and vegetables and drinking plenty of  fluids as your diet allows.  After surgery, you can help prevent lung complications by doing breathing exercises.  Take deep breaths and cough every 1-2 hours. Your doctor may order a device called an Incentive Spirometer to help you take deep breaths. When coughing or sneezing, hold a pillow firmly against your incision with both hands. This is called "splinting." Doing  this helps protect your incision. It also decreases belly discomfort.  If you are being discharged the day of surgery, you will not be allowed to drive home. You will need a responsible adult (18 years or older) to drive you home and stay with you that night.   If you are taking public transportation, you will need to have a responsible adult (18 years or older) with you. Please confirm with your physician that it is acceptable to use public transportation.   Please call the [place] at [phone] if you have any questions about these instructions.  Visitation Policy:  Patients undergoing a surgery or procedure may have one family member or support person with them as long as that person is not COVID-19 positive or experiencing its symptoms.  That person may remain in the waiting area during the procedure.  Children under 61 years of age may have both parents or legal guardians with them during their procedure.  Inpatient Visitation Update:   Two designated support people may visit a patient during visiting hours 7 am to 8 pm. It must be the same two designated people for the duration of the patient stay. The visitors may come and go during the day, and there is no switching out to have different visitors. A mask must be worn at all times, including in the patient room.  Children under 83 years of age:  a total of 4 designated visitors for the child's entire stay are allowed. Only 2 in the room at a time and only one staying overnight at a time. The overnight guest can now rotate during the child's hospital stay.  As a reminder, masks are still required for all [place] team members, patients and visitors in all [place] facilities.   Systemwide, no visitors 17 or younger.

## 2020-01-10 ENCOUNTER — Other Ambulatory Visit
Admission: RE | Admit: 2020-01-10 | Discharge: 2020-01-10 | Disposition: A | Discharge status: Home / Self Care | Payer: BC Managed Care – PPO | Source: Ambulatory Visit | Attending: Obstetrics and Gynecology | Admitting: Obstetrics and Gynecology

## 2020-01-10 ENCOUNTER — Other Ambulatory Visit: Payer: Self-pay

## 2020-01-10 DIAGNOSIS — Z01812 Encounter for preprocedural laboratory examination: Secondary | ICD-10-CM | POA: Insufficient documentation

## 2020-01-10 DIAGNOSIS — Z20822 Contact with and (suspected) exposure to covid-19: Secondary | ICD-10-CM | POA: Insufficient documentation

## 2020-01-10 LAB — TYPE AND SCREEN
ABO/RH(D): O NEG
Antibody Screen: NEGATIVE

## 2020-01-10 LAB — CBC
HCT: 35.3 % — ABNORMAL LOW (ref 36.0–46.0)
Hemoglobin: 12.9 g/dL (ref 12.0–15.0)
MCH: 31.9 pg (ref 26.0–34.0)
MCHC: 36.5 g/dL — ABNORMAL HIGH (ref 30.0–36.0)
MCV: 87.2 fL (ref 80.0–100.0)
Platelets: 257 10*3/uL (ref 150–400)
RBC: 4.05 MIL/uL (ref 3.87–5.11)
RDW: 11.7 % (ref 11.5–15.5)
WBC: 7.2 10*3/uL (ref 4.0–10.5)
nRBC: 0 % (ref 0.0–0.2)

## 2020-01-10 LAB — SARS CORONAVIRUS 2 (TAT 6-24 HRS): SARS Coronavirus 2: NEGATIVE

## 2020-01-12 ENCOUNTER — Encounter
Admission: RE | Disposition: A | Discharge status: Home / Self Care | Payer: Self-pay | Source: Home / Self Care | Attending: Obstetrics and Gynecology

## 2020-01-12 ENCOUNTER — Ambulatory Visit: Payer: BC Managed Care – PPO | Admitting: Anesthesiology

## 2020-01-12 ENCOUNTER — Ambulatory Visit
Admission: RE | Admit: 2020-01-12 | Discharge: 2020-01-12 | Disposition: A | Discharge status: Home / Self Care | Payer: BC Managed Care – PPO | Attending: Obstetrics and Gynecology | Admitting: Obstetrics and Gynecology

## 2020-01-12 ENCOUNTER — Other Ambulatory Visit: Payer: Self-pay

## 2020-01-12 DIAGNOSIS — F411 Generalized anxiety disorder: Secondary | ICD-10-CM | POA: Insufficient documentation

## 2020-01-12 DIAGNOSIS — I10 Essential (primary) hypertension: Secondary | ICD-10-CM | POA: Insufficient documentation

## 2020-01-12 DIAGNOSIS — Z87891 Personal history of nicotine dependence: Secondary | ICD-10-CM | POA: Diagnosis not present

## 2020-01-12 DIAGNOSIS — Z79899 Other long term (current) drug therapy: Secondary | ICD-10-CM | POA: Diagnosis not present

## 2020-01-12 DIAGNOSIS — D649 Anemia, unspecified: Secondary | ICD-10-CM | POA: Diagnosis not present

## 2020-01-12 DIAGNOSIS — Z85118 Personal history of other malignant neoplasm of bronchus and lung: Secondary | ICD-10-CM | POA: Insufficient documentation

## 2020-01-12 DIAGNOSIS — F329 Major depressive disorder, single episode, unspecified: Secondary | ICD-10-CM | POA: Insufficient documentation

## 2020-01-12 DIAGNOSIS — K219 Gastro-esophageal reflux disease without esophagitis: Secondary | ICD-10-CM | POA: Diagnosis not present

## 2020-01-12 DIAGNOSIS — N939 Abnormal uterine and vaginal bleeding, unspecified: Secondary | ICD-10-CM

## 2020-01-12 DIAGNOSIS — N920 Excessive and frequent menstruation with regular cycle: Secondary | ICD-10-CM | POA: Diagnosis present

## 2020-01-12 HISTORY — PX: HYSTEROSCOPY WITH D & C: SHX1775

## 2020-01-12 LAB — POCT PREGNANCY, URINE: Preg Test, Ur: NEGATIVE

## 2020-01-12 SURGERY — DILATATION AND CURETTAGE /HYSTEROSCOPY
Anesthesia: General

## 2020-01-12 MED ORDER — EPHEDRINE 5 MG/ML INJ
INTRAVENOUS | Status: AC
  Filled 2020-01-12: qty 10

## 2020-01-12 MED ORDER — LACTATED RINGERS IV SOLN: INTRAVENOUS | Status: DC

## 2020-01-12 MED ORDER — ONDANSETRON HCL 4 MG/2ML IJ SOLN
INTRAMUSCULAR | Status: DC | PRN
  Administered 2020-01-12: 4 mg via INTRAVENOUS

## 2020-01-12 MED ORDER — FENTANYL CITRATE (PF) 100 MCG/2ML IJ SOLN: 25.0000 ug | INTRAMUSCULAR | Status: DC | PRN

## 2020-01-12 MED ORDER — DEXAMETHASONE SODIUM PHOSPHATE 10 MG/ML IJ SOLN
INTRAMUSCULAR | Status: DC | PRN
  Administered 2020-01-12: 10 mg via INTRAVENOUS

## 2020-01-12 MED ORDER — CHLORHEXIDINE GLUCONATE 0.12 % MT SOLN
15.0000 mL | Freq: Once | OROMUCOSAL | Status: AC
  Administered 2020-01-12: 15 mL via OROMUCOSAL

## 2020-01-12 MED ORDER — KETOROLAC TROMETHAMINE 30 MG/ML IJ SOLN
INTRAMUSCULAR | Status: AC
  Filled 2020-01-12: qty 1

## 2020-01-12 MED ORDER — PROPOFOL 10 MG/ML IV BOLUS
INTRAVENOUS | Status: DC | PRN
  Administered 2020-01-12: 50 mg via INTRAVENOUS
  Administered 2020-01-12: 150 mg via INTRAVENOUS

## 2020-01-12 MED ORDER — FENTANYL CITRATE (PF) 100 MCG/2ML IJ SOLN
INTRAMUSCULAR | Status: DC | PRN
  Administered 2020-01-12 (×2): 25 ug via INTRAVENOUS

## 2020-01-12 MED ORDER — ONDANSETRON HCL 4 MG/2ML IJ SOLN
INTRAMUSCULAR | Status: AC
  Filled 2020-01-12: qty 2

## 2020-01-12 MED ORDER — LIDOCAINE HCL (PF) 2 % IJ SOLN
INTRAMUSCULAR | Status: AC
  Filled 2020-01-12: qty 5

## 2020-01-12 MED ORDER — SILVER NITRATE-POT NITRATE 75-25 % EX MISC
CUTANEOUS | Status: DC | PRN
  Administered 2020-01-12: 6

## 2020-01-12 MED ORDER — FENTANYL CITRATE (PF) 100 MCG/2ML IJ SOLN
INTRAMUSCULAR | Status: AC
  Filled 2020-01-12: qty 2

## 2020-01-12 MED ORDER — ONDANSETRON HCL 4 MG/2ML IJ SOLN: 4.0000 mg | Freq: Once | INTRAMUSCULAR | Status: DC | PRN

## 2020-01-12 MED ORDER — KETOROLAC TROMETHAMINE 30 MG/ML IJ SOLN
INTRAMUSCULAR | Status: DC | PRN
  Administered 2020-01-12: 30 mg via INTRAVENOUS

## 2020-01-12 MED ORDER — MIDAZOLAM HCL 2 MG/2ML IJ SOLN
INTRAMUSCULAR | Status: DC | PRN
  Administered 2020-01-12: 2 mg via INTRAVENOUS

## 2020-01-12 MED ORDER — SILVER NITRATE-POT NITRATE 75-25 % EX MISC
CUTANEOUS | Status: AC
  Filled 2020-01-12: qty 10

## 2020-01-12 MED ORDER — MIDAZOLAM HCL 2 MG/2ML IJ SOLN
INTRAMUSCULAR | Status: AC
  Filled 2020-01-12: qty 2

## 2020-01-12 MED ORDER — EPHEDRINE SULFATE 50 MG/ML IJ SOLN
INTRAMUSCULAR | Status: DC | PRN
  Administered 2020-01-12 (×3): 5 mg via INTRAVENOUS

## 2020-01-12 MED ORDER — DEXAMETHASONE SODIUM PHOSPHATE 10 MG/ML IJ SOLN
INTRAMUSCULAR | Status: AC
  Filled 2020-01-12: qty 1

## 2020-01-12 MED ORDER — ORAL CARE MOUTH RINSE: 15.0000 mL | Freq: Once | OROMUCOSAL | Status: AC

## 2020-01-12 MED ORDER — LIDOCAINE HCL (CARDIAC) PF 100 MG/5ML IV SOSY
PREFILLED_SYRINGE | INTRAVENOUS | Status: DC | PRN
  Administered 2020-01-12: 80 mg via INTRAVENOUS

## 2020-01-12 MED ORDER — PROPOFOL 10 MG/ML IV BOLUS
INTRAVENOUS | Status: AC
  Filled 2020-01-12: qty 20

## 2020-01-12 MED ORDER — OXYCODONE HCL 5 MG/5ML PO SOLN: 5.0000 mg | Freq: Once | ORAL | Status: DC | PRN

## 2020-01-12 MED ORDER — OXYCODONE HCL 5 MG PO TABS: 5.0000 mg | ORAL_TABLET | Freq: Once | ORAL | Status: DC | PRN

## 2020-01-12 MED ORDER — ACETAMINOPHEN 10 MG/ML IV SOLN: 1000.0000 mg | Freq: Once | INTRAVENOUS | Status: DC | PRN

## 2020-01-12 SURGICAL SUPPLY — 21 items
CATH ROBINSON RED A/P 16FR (CATHETERS) ×3 IMPLANT
DEVICE MYOSURE LITE (MISCELLANEOUS) IMPLANT
DEVICE MYOSURE REACH (MISCELLANEOUS) IMPLANT
ELECT REM PT RETURN 9FT ADLT (ELECTROSURGICAL)
ELECTRODE REM PT RTRN 9FT ADLT (ELECTROSURGICAL) IMPLANT
GAUZE 4X4 16PLY RFD (DISPOSABLE) ×3 IMPLANT
GLOVE BIOGEL PI IND STRL 6.5 (GLOVE) ×2 IMPLANT
GLOVE BIOGEL PI INDICATOR 6.5 (GLOVE) ×4
GLOVE SURG SYN 6.5 ES PF (GLOVE) ×3 IMPLANT
GLOVE SURG SYN 6.5 PF PI (GLOVE) ×1 IMPLANT
GOWN STRL REUS W/ TWL LRG LVL3 (GOWN DISPOSABLE) ×2 IMPLANT
GOWN STRL REUS W/TWL LRG LVL3 (GOWN DISPOSABLE) ×6
KIT PROCEDURE FLUENT (KITS) IMPLANT
PACK DNC HYST (MISCELLANEOUS) ×3 IMPLANT
PAD OB MATERNITY 4.3X12.25 (PERSONAL CARE ITEMS) ×3 IMPLANT
PAD PREP 24X41 OB/GYN DISP (PERSONAL CARE ITEMS) ×3 IMPLANT
SEAL ROD LENS SCOPE MYOSURE (ABLATOR) ×3 IMPLANT
SOL .9 NS 3000ML IRR  AL (IV SOLUTION) ×3
SOL .9 NS 3000ML IRR AL (IV SOLUTION) ×1
SOL .9 NS 3000ML IRR UROMATIC (IV SOLUTION) ×1 IMPLANT
TOWEL OR 17X26 4PK STRL BLUE (TOWEL DISPOSABLE) ×3 IMPLANT

## 2020-01-12 NOTE — H&P (Signed)
Catherine Gilbert is an 44 y.o. female.   Chief Complaint: Abnormal uterine bleeding HPI: Patient has a thickened endometrium and has been experiencing prolonged menstrual bleeding.   Past Medical History:  Diagnosis Date  . Abnormal Pap smear   . Anemia   . Anxiety   . Anxiety state 04/23/2011  . Cancer ([place]) 02/2018   lung rt  . Depression   . Dysmenorrhea 08/27/2011  . Dyspnea on exertion 10/30/2015  . Essential hypertension 09/15/2012  . Family history of ASCVD (arteriosclerotic cardiovascular disease) 12/31/2016  . Family history of premature CAD 11/11/2016  . GAD (generalized anxiety disorder) 10/30/2015  . GERD (gastroesophageal reflux disease) 05/29/2014  . Headache 01/01/2017  . HSV (herpes simplex virus) infection   . Hypertension   . Initiation of Depo Provera 09/24/2011  . Lung nodule    RLL on coronary calcium score CT  . Migraine headache     Past Surgical History:  Procedure Laterality Date  . BREAST CYST ASPIRATION Right 02/10/2018   neg  . CESAREAN SECTION     x 3   . COLPOSCOPY  2010  . DILATION AND CURETTAGE OF UTERUS    . FLEXIBLE BRONCHOSCOPY N/A 03/29/2018   Procedure: PREOP  BRONCHOSCOPY;  Surgeon: Nestor Lewandowsky, MD;  Location: [facility] ORS;  Service: Thoracic;  Laterality: N/A;  . THORACOTOMY Right 03/29/2018   Procedure: THORACOTOMY MAJOR- POSSIBLE LOBECTOMY;  Surgeon: Nestor Lewandowsky, MD;  Location: [facility] ORS;  Service: Thoracic;  Laterality: Right;  . TUBAL LIGATION    . VIDEO ASSISTED THORACOSCOPY (VATS)/WEDGE RESECTION Right 03/29/2018   Procedure: VIDEO ASSISTED THORACOSCOPY (VATS)/WEDGE RESECTION;  Surgeon: Nestor Lewandowsky, MD;  Location: [facility] ORS;  Service: Thoracic;  Laterality: Right;    Family History  Problem Relation Age of Onset  . Stroke Mother 2       massive stroke  . Heart disease Father 67       congestive heart failure  . Hypertension Father   . Heart disease Brother 3       Suspected MI and SCD  . Heart disease Sister   . Stroke Sister 2   . Breast cancer Maternal Grandmother 36  . Breast cancer Cousin        x2 paternal, both alive   Social History:  reports that she quit smoking about 4 years ago. Her smoking use included cigarettes. She has a 25.00 pack-year smoking history. She has never used smokeless tobacco. She reports current alcohol use. She reports that she does not use drugs.  Allergies: No Known Allergies  Medications Prior to Admission  Medication Sig Dispense Refill  . ALPRAZolam (XANAX) 0.5 MG tablet Take 1 tablet (0.5 mg total) by mouth at bedtime as needed. (Patient taking differently: Take 0.5 mg by mouth at bedtime as needed for sleep. ) 30 tablet 5  . ASHWAGANDHA PO Take 1 tablet by mouth daily.    . Aspirin-Caffeine (BC FAST PAIN RELIEF PO) Take 1 packet by mouth daily as needed (headache).    . ferrous sulfate (FEROSUL) 325 (65 FE) MG tablet TAKE 1 TABLET(325 MG) BY MOUTH TWICE DAILY WITH A MEAL (Patient taking differently: Take 325 mg by mouth daily. ) 180 tablet 3  . lisinopril (ZESTRIL) 10 MG tablet Take 1/2-1 qd (Patient taking differently: Take 5-10 mg by mouth See admin instructions. Alternate between 5 mg and 10 mg) 90 tablet 3  . Multiple Vitamin (MULTIVITAMIN WITH MINERALS) TABS tablet Take 2 tablets by mouth daily. Juice Plus fruit and  vegetables    . omeprazole (PRILOSEC) 40 MG capsule TAKE 1 CAPSULE(40 MG) BY MOUTH EVERY MORNING (Patient taking differently: Take 40 mg by mouth daily. ) 90 capsule 3  . Phenylephrine-APAP-guaiFENesin (TYLENOL SINUS SEVERE PO) Take 2 tablets by mouth daily as needed (sinus headaches).    . sertraline (ZOLOFT) 100 MG tablet Take 1 tablet (100 mg total) by mouth daily. (Patient taking differently: Take 50 mg by mouth daily. ) 90 tablet 3  . valACYclovir (VALTREX) 500 MG tablet TAKE 2 TABLETS BY MOUTH TODAY, THEN 2 TABLETS TWICE DAILY AS NEEDED. (Patient taking differently: Take 1,000 mg by mouth daily as needed (N/A). TAKE 2 TABLETS BY MOUTH TODAY, THEN 2 TABLETS  TWICE DAILY AS NEEDED.) 60 tablet 5    Results for orders placed or performed during the hospital encounter of 01/12/20 (from the past 48 hour(s))  Pregnancy, urine POC     Status: None   Collection Time: 01/12/20  6:50 AM  Result Value Ref Range   Preg Test, Ur NEGATIVE NEGATIVE    Comment:        THE SENSITIVITY OF THIS METHODOLOGY IS >24 mIU/mL    No results found.  Review of Systems  Constitutional: Negative for chills and fever.  HENT: Negative for congestion, hearing loss and sinus pain.   Respiratory: Negative for cough, shortness of breath and wheezing.   Cardiovascular: Negative for chest pain, palpitations and leg swelling.  Gastrointestinal: Negative for abdominal pain, constipation, diarrhea, nausea and vomiting.  Genitourinary: Negative for dysuria, flank pain, frequency, hematuria and urgency.  Musculoskeletal: Negative for back pain.  Skin: Negative for rash.  Neurological: Negative for dizziness and headaches.  Psychiatric/Behavioral: Negative for suicidal ideas. The patient is not nervous/anxious.     Blood pressure (!) 163/94, pulse 73, temperature 97.7 F (36.5 C), temperature source Oral, resp. rate 12, weight 74.8 kg, last menstrual period 12/13/2019, SpO2 99 %. Physical Exam Vitals and nursing note reviewed.  Constitutional:      Appearance: She is well-developed.  HENT:     Head: Normocephalic and atraumatic.  Cardiovascular:     Rate and Rhythm: Normal rate and regular rhythm.  Pulmonary:     Effort: Pulmonary effort is normal.     Breath sounds: Normal breath sounds.  Abdominal:     General: Bowel sounds are normal.     Palpations: Abdomen is soft.  Musculoskeletal:        General: Normal range of motion.  Skin:    General: Skin is warm and dry.  Neurological:     Mental Status: She is alert and oriented to person, place, and time.  Psychiatric:        Behavior: Behavior normal.        Thought Content: Thought content normal.         Judgment: Judgment normal.      Assessment/Plan 44 year old with abnormal uterine bleeding and prolonged menstrual cycle. Patient is here for a  hysteroscopy D&C.   If results of complete evaluation are negative she will consider hormonal management of her menstrual cycle.  Options progesterone only pill and IUD discussed with patient.  Have discussed risks benefits and alternatives with the patient in detail. All questions answered and consents signed. Will proceed with hysteroscopy D&C.   Homero Fellers, MD 01/12/2020, 7:20 AM

## 2020-01-12 NOTE — Transfer of Care (Signed)
Immediate Anesthesia Transfer of Care Note  Patient: Catherine Gilbert  Procedure(s) Performed: DILATATION AND CURETTAGE /HYSTEROSCOPY (N/A )  Patient Location: PACU  Anesthesia Type:General  Level of Consciousness: drowsy and patient cooperative  Airway & Oxygen Therapy: Patient Spontanous Breathing and Patient connected to face mask oxygen  Post-op Assessment: Report given to RN and Post -op Vital signs reviewed and stable  Post vital signs: Reviewed and stable  Last Vitals:  Vitals Value Taken Time  BP 142/83 01/12/20 0839  Temp 36.4 C 01/12/20 0839  Pulse 79 01/12/20 0841  Resp 0 01/12/20 0841  SpO2 100 % 01/12/20 0841  Vitals shown include unvalidated device data.  Last Pain:  Vitals:   01/12/20 0715  TempSrc: Oral  PainSc: 0-No pain         Complications: No complications documented.

## 2020-01-12 NOTE — Discharge Instructions (Signed)
Hysteroscopy, Care After This sheet gives you information about how to care for yourself after your procedure. Your health care provider may also give you more specific instructions. If you have problems or questions, contact your health care provider. What can I expect after the procedure? After the procedure, it is common to have:  Cramping.  Bleeding. This can vary from light spotting to menstrual-like bleeding. Follow these instructions at home: Activity  Rest for 1-2 days after the procedure.  Do not douche, use tampons, or have sex for 2 weeks after the procedure, or until your health care provider approves.  Do not drive for 24 hours after the procedure, or for as long as told by your health care provider.  Do not drive, use heavy machinery, or drink alcohol while taking prescription pain medicines. Medicines  Take Tylenol 1000 mg every 6 hours as needed for pain Take Ibuprofen 600 mg every 6 hours as needed for pain   Take over-the-counter and prescription medicines only as told by your health care provider.  Do not take aspirin during recovery. It can increase the risk of bleeding. General instructions  Do not take baths, swim, or use a hot tub until your health care provider approves. Take showers instead of baths for 2 weeks, or for as long as told by your health care provider.  To prevent or treat constipation while you are taking prescription pain medicine, your health care provider may recommend that you: ? Drink enough fluid to keep your urine clear or pale yellow. ? Take over-the-counter or prescription medicines. ? Eat foods that are high in fiber, such as fresh fruits and vegetables, whole grains, and beans. ? Limit foods that are high in fat and processed sugars, such as fried and sweet foods.  Keep all follow-up visits as told by your health care provider. This is important. Contact a health care provider if:  You feel dizzy or lightheaded.  You feel  nauseous.  You have abnormal vaginal discharge.  You have a rash.  You have pain that does not get better with medicine.  You have chills. Get help right away if:  You have bleeding that is heavier than a normal menstrual period.  You have a fever.  You have pain or cramps that get worse.  You develop new abdominal pain.  You faint.  You have pain in your shoulders.  You have shortness of breath. Summary  After the procedure, you may have cramping and some vaginal bleeding.  Do not douche, use tampons, or have sex for 2 weeks after the procedure, or until your health care provider approves.  Do not take baths, swim, or use a hot tub until your health care provider approves. Take showers instead of baths for 2 weeks, or for as long as told by your health care provider.  Report any unusual symptoms to your health care provider.  Keep all follow-up visits as told by your health care provider. This is important. This information is not intended to replace advice given to you by your health care provider. Make sure you discuss any questions you have with your health care provider. Document Revised: 05/29/2017 Document Reviewed: 07/15/2016 Elsevier Patient Education  [place].

## 2020-01-12 NOTE — Anesthesia Postprocedure Evaluation (Signed)
Anesthesia Post Note  Patient: Catherine Gilbert  Procedure(s) Performed: DILATATION AND CURETTAGE /HYSTEROSCOPY (N/A )  Patient location during evaluation: PACU Anesthesia Type: General Level of consciousness: awake and alert Pain management: pain level controlled Vital Signs Assessment: post-procedure vital signs reviewed and stable Respiratory status: spontaneous breathing, nonlabored ventilation, respiratory function stable and patient connected to nasal cannula oxygen Cardiovascular status: blood pressure returned to baseline and stable Postop Assessment: no apparent nausea or vomiting Anesthetic complications: no   No complications documented.   Last Vitals:  Vitals:   01/12/20 0915 01/12/20 0947  BP:  139/84  Pulse: 84 74  Resp: 20 16  Temp: (!) 36.1 C (!) 36.3 C  SpO2: 99% 100%    Last Pain:  Vitals:   01/12/20 0947  TempSrc: Temporal  PainSc: [place]

## 2020-01-12 NOTE — Anesthesia Procedure Notes (Signed)
Procedure Name: LMA Insertion Date/Time: 01/12/2020 7:33 AM Performed by: Caryl Asp, CRNA Pre-anesthesia Checklist: Patient identified, Patient being monitored, Timeout performed, Emergency Drugs available and Suction available Patient Re-evaluated:Patient Re-evaluated prior to induction Oxygen Delivery Method: Circle system utilized Preoxygenation: Pre-oxygenation with 100% oxygen Induction Type: IV induction Ventilation: Mask ventilation without difficulty LMA: LMA inserted LMA Size: 4.0 Tube type: Oral Number of attempts: 1 Placement Confirmation: positive ETCO2 and breath sounds checked- equal and bilateral Tube secured with: Tape Dental Injury: Teeth and Oropharynx as per pre-operative assessment

## 2020-01-12 NOTE — Op Note (Signed)
Operative Note  01/12/2020  PRE-OP DIAGNOSIS: Abnormal uterine bleeding  POST-OP DIAGNOSIS: same   SURGEON: Adlene Adduci MD  PROCEDURE: Procedure(s): DILATATION AND CURETTAGE /HYSTEROSCOPY   ANESTHESIA: Choice   ESTIMATED BLOOD LOSS: 15 cc   SPECIMENS:  Endometrial curettings  FLUID DEFICIT: 093 cc  COMPLICATIONS: None  DISPOSITION: PACU - hemodynamically stable.  CONDITION: stable  FINDINGS: Exam under anesthesia revealed  10 cm anteverted uterus with bilateral adnexa without masses or fullness. Hysteroscopy revealed normal uterine cavity with bilateral tubal ostia and normal appearing endocervical canal.  PROCEDURE IN DETAIL: After informed consent was obtained, the patient was taken to the operating room where anesthesia was obtained without difficulty. The patient was positioned in the dorsal lithotomy position in [place]. The patient's bladder was catheterized with an in and out foley catheter. The patient was examined under anesthesia, with the above noted findings. The weightedspeculum was placed inside the patient's vagina, and the the anterior lip of the cervix was seen and grasped with the tenaculum.  The uterine cavity was sounded to 10 cm, and then the cervix was progressively dilated to a 18 French-Pratt dilator. The 0 degree hysteroscope was introduced, with saline fluid used to distend the intrauterine cavity, with the above noted findings.  The hysteroscope was removed.  The uterine cavity was curetted until a gritty texture was noted, yielding endometrial curettings. All instruments were removed, with excellent hemostasis noted throughout. She was then taken out of dorsal lithotomy.  Minimal discrepancy in fluid was noted.  The patient tolerated the procedure well. Sponge, lap and needle counts were correct x2. The patient was taken to recovery room in excellent condition.  Adrian Prows MD [facility] 01/12/2020 8:39 AM

## 2020-01-12 NOTE — Anesthesia Preprocedure Evaluation (Signed)
Anesthesia Evaluation  Patient identified by MRN, date of birth, ID band Patient awake    Reviewed: Allergy & Precautions, NPO status , Patient's Chart, lab work & pertinent test results  History of Anesthesia Complications Negative for: history of anesthetic complications  Airway Mallampati: II  TM Distance: >3 FB Neck ROM: Full    Dental no notable dental hx. (+) Teeth Intact, Dental Advisory Given   Pulmonary neg sleep apnea, neg COPD, Not current smoker, former smoker,  Prior lung cancer s/p lobectomy   breath sounds clear to auscultation- rhonchi (-) wheezing      Cardiovascular Exercise Tolerance: Good hypertension, Pt. on medications (-) CAD, (-) Past MI, (-) Cardiac Stents and (-) CABG  Rhythm:Regular Rate:Normal - Systolic murmurs and - Diastolic murmurs FH of sudden cardiac death, has had workup with cardiology     Neuro/Psych  Headaches, PSYCHIATRIC DISORDERS Anxiety Depression    GI/Hepatic Neg liver ROS, GERD  Medicated and Controlled,  Endo/Other  negative endocrine ROSneg diabetes  Renal/GU negative Renal ROS     Musculoskeletal negative musculoskeletal ROS (+)   Abdominal (+) - obese,   Peds  Hematology  (+) anemia ,   Anesthesia Other Findings Past Medical History: No date: Abnormal Pap smear No date: Anemia No date: Anxiety 04/23/2011: Anxiety state No date: Depression 08/27/2011: Dysmenorrhea 10/30/2015: Dyspnea on exertion 09/15/2012: Essential hypertension 12/31/2016: Family history of ASCVD (arteriosclerotic cardiovascular  disease) 11/11/2016: Family history of premature CAD 10/30/2015: GAD (generalized anxiety disorder) 05/29/2014: GERD (gastroesophageal reflux disease) 01/01/2017: Headache No date: HSV (herpes simplex virus) infection No date: Hypertension 09/24/2011: Initiation of Depo Provera No date: Lung nodule     Comment:  RLL on coronary calcium score CT No date: Migraine  headache   Reproductive/Obstetrics                             Lab Results  Component Value Date   WBC 7.2 01/10/2020   HGB 12.9 01/10/2020   HCT 35.3 (L) 01/10/2020   MCV 87.2 01/10/2020   PLT 257 01/10/2020    Anesthesia Physical  Anesthesia Plan  ASA: II  Anesthesia Plan: General   Post-op Pain Management:    Induction: Intravenous  PONV Risk Score and Plan: 4 or greater and Ondansetron, Dexamethasone and Midazolam  Airway Management Planned: LMA  Additional Equipment: None  Intra-op Plan:   Post-operative Plan: Extubation in OR  Informed Consent: I have reviewed the patients History and Physical, chart, labs and discussed the procedure including the risks, benefits and alternatives for the proposed anesthesia with the patient or authorized representative who has indicated his/her understanding and acceptance.     Dental advisory given  Plan Discussed with: CRNA and Anesthesiologist  Anesthesia Plan Comments: (Discussed risks of anesthesia with patient, including PONV, sore throat, lip/dental damage. Rare risks discussed as well, such as cardiorespiratory and neurological sequelae. Patient understands.)        Anesthesia Quick Evaluation

## 2020-01-13 ENCOUNTER — Encounter: Payer: Self-pay | Admitting: Obstetrics and Gynecology

## 2020-01-13 LAB — SURGICAL PATHOLOGY

## 2020-01-16 ENCOUNTER — Telehealth: Payer: Self-pay

## 2020-01-16 NOTE — Telephone Encounter (Signed)
Phone call ret'd to pt.  Was made aware of the water contamination, in the [place], last week.  Pt. Acknowledged she was aware.  Advised that the water was tested at [facility], and was not found to be contaminated.  Advised pt. She is at very low risk of getting infected from the hospital water.  Advised to watch for any diarrhea, stomach cramps, and nausea/ vomiting, and report to her PCP.  Pt. Verb. Understanding, and agreed with plan.

## 2020-01-16 NOTE — Telephone Encounter (Signed)
Patient is returning a call to [place].  Tried to call Arbie Cookey, but she was not available.  Please call patient back at [phone]

## 2020-01-16 NOTE — Telephone Encounter (Signed)
PC to pt.  Unable to reach at this time.  Left vm to return call to Arbie Cookey, RN, @ [facility], re: a matter in relation to recent procedure at [facility].  When pt. Returns call, please transfer to Triage nurse, Arbie Cookey, RN.

## 2020-01-18 ENCOUNTER — Encounter: Payer: Self-pay | Admitting: Obstetrics and Gynecology

## 2020-01-18 ENCOUNTER — Ambulatory Visit (INDEPENDENT_AMBULATORY_CARE_PROVIDER_SITE_OTHER): Payer: BC Managed Care – PPO | Admitting: Obstetrics and Gynecology

## 2020-01-18 ENCOUNTER — Other Ambulatory Visit: Payer: Self-pay

## 2020-01-18 VITALS — BP 130/70 | HR 73 | Resp 16 | Ht 64.0 in | Wt 165.6 lb

## 2020-01-18 DIAGNOSIS — N760 Acute vaginitis: Secondary | ICD-10-CM

## 2020-01-18 DIAGNOSIS — N923 Ovulation bleeding: Secondary | ICD-10-CM

## 2020-01-18 DIAGNOSIS — N939 Abnormal uterine and vaginal bleeding, unspecified: Secondary | ICD-10-CM

## 2020-01-18 MED ORDER — NORETHINDRONE 0.35 MG PO TABS: 1.0000 | ORAL_TABLET | Freq: Every day | ORAL | 12 refills | Status: DC

## 2020-01-18 NOTE — Progress Notes (Signed)
  Postoperative Follow-up Patient presents post op from operative hysteroscopy for abnormal uterine bleeding, 1 week ago.  Subjective: Patient reports some improvement in her preop symptoms. Eating a regular diet without difficulty. Pain is controlled without any medications.  Activity: normal activities of daily living. Patient reports additional symptom's since surgery of jelly red discharge  Objective: BP 130/70   Pulse 73   Resp 16   Ht 5\' 4"  (1.626 m)   Wt 165 lb 9.6 oz (75.1 kg)   SpO2 98%   BMI 28.43 kg/m  Physical Exam Constitutional:      Appearance: She is well-developed.  Genitourinary:     Vulva, vagina and uterus normal.     No lesions in the vagina.     No cervical motion tenderness.     No right or left adnexal mass present.  HENT:     Head: Normocephalic and atraumatic.  Neck:     Thyroid: No thyromegaly.  Cardiovascular:     Rate and Rhythm: Normal rate and regular rhythm.     Heart sounds: Normal heart sounds.  Pulmonary:     Effort: Pulmonary effort is normal.     Breath sounds: Normal breath sounds.  Chest:     Breasts:        Right: No inverted nipple, mass, nipple discharge or skin change.        Left: No inverted nipple, mass, nipple discharge or skin change.  Abdominal:     General: Bowel sounds are normal. There is no distension.     Palpations: Abdomen is soft. There is no mass.  Musculoskeletal:     Cervical back: Neck supple.  Neurological:     Mental Status: She is alert and oriented to person, place, and time.  Skin:    General: Skin is warm and dry.  Psychiatric:        Behavior: Behavior normal.        Thought Content: Thought content normal.        Judgment: Judgment normal.  Vitals reviewed. Exam conducted with a chaperone present.     Assessment: s/p :  operative hysteroscopy stable  Plan: Patient has done well after surgery with no apparent complications.  I have discussed the post-operative course to date, and the expected  progress moving forward.  The patient understands what complications to be concerned about.  I will see the patient in routine follow up, or sooner if needed.    Nuswab sent for possible vagintis. Will start POP for irregular menstrual bleeding. Having bleeding for 2-3 weeks during cycle.   Activity plan: No restriction.  Pelvic rest.  Wanda Rideout R Lively Haberman 01/18/2020, 10:32 AM

## 2020-01-21 LAB — NUSWAB BV AND CANDIDA, NAA
Candida albicans, NAA: NEGATIVE
Candida glabrata, NAA: NEGATIVE

## 2020-02-07 ENCOUNTER — Telehealth: Payer: Self-pay | Admitting: *Deleted

## 2020-02-07 NOTE — Telephone Encounter (Signed)
Last office visit 10/13/2019 for TOC.  Last refilled 08/01/2019 for #30 with 5 refills. By Dr. Deborra Medina. No UDS/Contract on file.  No future appointments with PCP.

## 2020-02-07 NOTE — Telephone Encounter (Signed)
Needs appointment for controlled substance contract and UDS

## 2020-02-08 ENCOUNTER — Other Ambulatory Visit: Payer: Self-pay | Admitting: Family Medicine

## 2020-02-08 NOTE — Telephone Encounter (Signed)
LVM for patient to schedule OV for medication.

## 2020-02-08 NOTE — Telephone Encounter (Signed)
Pt is scheduled for cpe on 03/22/20. Pt was concerned about having her annual physical and wanted to make sure she was scheduled for that. She said they can address the medication at this time but if she wants to send a prescription that is fine too.

## 2020-02-08 NOTE — Telephone Encounter (Signed)
Last OV- 10-13-2019 Last refill- 08-01-2019..   30 tabs 1 tab daily with 5 refills Next appt- 03-22-2020

## 2020-02-15 ENCOUNTER — Encounter: Payer: BC Managed Care – PPO | Admitting: Family Medicine

## 2020-03-07 ENCOUNTER — Telehealth: Payer: Self-pay

## 2020-03-07 NOTE — Telephone Encounter (Signed)
Im not sure what you can do about this issue.

## 2020-03-07 NOTE — Telephone Encounter (Signed)
Can you help this person with her billing questions?

## 2020-03-07 NOTE — Telephone Encounter (Signed)
Pt calling; recv'd bill from [place] for the full amount stating insurance wo't cover it b/c it is not a needed procedure.  Pt needs to see if it was coded incorrectly or what has happened and why she's receiving a $500 bill from [place].  [phone]

## 2020-03-09 NOTE — Telephone Encounter (Signed)
Patient calling to follow up on message left about her bill. Patient aware we are looking into this an awaiting for office manager Izora Gala to return from being out of office on Monday, 03/12/20

## 2020-03-13 DIAGNOSIS — F4323 Adjustment disorder with mixed anxiety and depressed mood: Secondary | ICD-10-CM | POA: Diagnosis not present

## 2020-03-20 DIAGNOSIS — F4323 Adjustment disorder with mixed anxiety and depressed mood: Secondary | ICD-10-CM | POA: Diagnosis not present

## 2020-03-21 NOTE — Telephone Encounter (Signed)
Catherine Gilbert has taken care of this.

## 2020-03-22 ENCOUNTER — Ambulatory Visit (INDEPENDENT_AMBULATORY_CARE_PROVIDER_SITE_OTHER): Payer: BC Managed Care – PPO | Admitting: Family Medicine

## 2020-03-22 ENCOUNTER — Encounter: Payer: Self-pay | Admitting: Family Medicine

## 2020-03-22 ENCOUNTER — Other Ambulatory Visit: Payer: Self-pay

## 2020-03-22 VITALS — BP 122/64 | HR 80 | Temp 98.3°F | Ht 64.5 in | Wt 160.0 lb

## 2020-03-22 DIAGNOSIS — Z0000 Encounter for general adult medical examination without abnormal findings: Secondary | ICD-10-CM

## 2020-03-22 DIAGNOSIS — F409 Phobic anxiety disorder, unspecified: Secondary | ICD-10-CM

## 2020-03-22 DIAGNOSIS — Z1159 Encounter for screening for other viral diseases: Secondary | ICD-10-CM

## 2020-03-22 DIAGNOSIS — F5105 Insomnia due to other mental disorder: Secondary | ICD-10-CM | POA: Diagnosis not present

## 2020-03-22 MED ORDER — ALPRAZOLAM 0.5 MG PO TABS: 0.5000 mg | ORAL_TABLET | Freq: Every evening | ORAL | 5 refills | Status: DC | PRN

## 2020-03-22 NOTE — Patient Instructions (Addendum)
Colon Cancer Screening:  Age 45-75 yo - benefits outweigh the risk. Adults 71-85 yo who have never been screened benefit.  Benefits: 134000 people in 2016 will be diagnosed and 49,000 will die - early detection helps Harms: Complications 2/2 to colonoscopy High Risk (Colonoscopy): genetic disorder (Lynch syndrome or familial adenomatous polyposis), personal hx of IBD, previous adenomatous polyp, or previous colorectal cancer, FamHx start 10 years before the age at diagnosis, increased in males and black race  Options:  FIT - looks for hemoglobin (blood in the stool) - specific and fairly sensitive - must be done annually Cologuard - looks for DNA and blood - more sensitive - therefore can have more false positives, every 3 years Colonoscopy - every 10 years if normal - sedation, bowl prep, must have someone drive you    Preventive Care 86-68 Years Old, Female Preventive care refers to visits with your health care provider and lifestyle choices that can promote health and wellness. This includes:  A yearly physical exam. This may also be called an annual well check.  Regular dental visits and eye exams.  Immunizations.  Screening for certain conditions.  Healthy lifestyle choices, such as eating a healthy diet, getting regular exercise, not using drugs or products that contain nicotine and tobacco, and limiting alcohol use. What can I expect for my preventive care visit? Physical exam Your health care provider will check your:  Height and weight. This may be used to calculate body mass index (BMI), which tells if you are at a healthy weight.  Heart rate and blood pressure.  Skin for abnormal spots. Counseling Your health care provider may ask you questions about your:  Alcohol, tobacco, and drug use.  Emotional well-being.  Home and relationship well-being.  Sexual activity.  Eating habits.  Work and work Statistician.  Method of birth control.  Menstrual  cycle.  Pregnancy history. What immunizations do I need?  Influenza (flu) vaccine  This is recommended every year. Tetanus, diphtheria, and pertussis (Tdap) vaccine  You may need a Td booster every 10 years. Varicella (chickenpox) vaccine  You may need this if you have not been vaccinated. Zoster (shingles) vaccine  You may need this after age 14. Measles, mumps, and rubella (MMR) vaccine  You may need at least one dose of MMR if you were born in 1957 or later. You may also need a second dose. Pneumococcal conjugate (PCV13) vaccine  You may need this if you have certain conditions and were not previously vaccinated. Pneumococcal polysaccharide (PPSV23) vaccine  You may need one or two doses if you smoke cigarettes or if you have certain conditions. Meningococcal conjugate (MenACWY) vaccine  You may need this if you have certain conditions. Hepatitis A vaccine  You may need this if you have certain conditions or if you travel or work in places where you may be exposed to hepatitis A. Hepatitis B vaccine  You may need this if you have certain conditions or if you travel or work in places where you may be exposed to hepatitis B. Haemophilus influenzae type b (Hib) vaccine  You may need this if you have certain conditions. Human papillomavirus (HPV) vaccine  If recommended by your health care provider, you may need three doses over 6 months. You may receive vaccines as individual doses or as more than one vaccine together in one shot (combination vaccines). Talk with your health care provider about the risks and benefits of combination vaccines. What tests do I need? Blood tests  Lipid and cholesterol levels. These may be checked every 5 years, or more frequently if you are over 14 years old.  Hepatitis C test.  Hepatitis B test. Screening  Lung cancer screening. You may have this screening every year starting at age 54 if you have a 30-pack-year history of smoking and  currently smoke or have quit within the past 15 years.  Colorectal cancer screening. All adults should have this screening starting at age 66 and continuing until age 71. Your health care provider may recommend screening at age 24 if you are at increased risk. You will have tests every 1-10 years, depending on your results and the type of screening test.  Diabetes screening. This is done by checking your blood sugar (glucose) after you have not eaten for a while (fasting). You may have this done every 1-3 years.  Mammogram. This may be done every 1-2 years. Talk with your health care provider about when you should start having regular mammograms. This may depend on whether you have a family history of breast cancer.  BRCA-related cancer screening. This may be done if you have a family history of breast, ovarian, tubal, or peritoneal cancers.  Pelvic exam and Pap test. This may be done every 3 years starting at age 56. Starting at age 29, this may be done every 5 years if you have a Pap test in combination with an HPV test. Other tests  Sexually transmitted disease (STD) testing.  Bone density scan. This is done to screen for osteoporosis. You may have this scan if you are at high risk for osteoporosis. Follow these instructions at home: Eating and drinking  Eat a diet that includes fresh fruits and vegetables, whole grains, lean protein, and low-fat dairy.  Take vitamin and mineral supplements as recommended by your health care provider.  Do not drink alcohol if: ? Your health care provider tells you not to drink. ? You are pregnant, may be pregnant, or are planning to become pregnant.  If you drink alcohol: ? Limit how much you have to 0-1 drink a day. ? Be aware of how much alcohol is in your drink. In the [place] one drink equals one 12 oz bottle of beer (355 mL), one 5 oz glass of wine (148 mL), or one 1 oz glass of hard liquor (44 mL). Lifestyle  Take daily care of your teeth and  gums.  Stay active. Exercise for at least 30 minutes on 5 or more days each week.  Do not use any products that contain nicotine or tobacco, such as cigarettes, e-cigarettes, and chewing tobacco. If you need help quitting, ask your health care provider.  If you are sexually active, practice safe sex. Use a condom or other form of birth control (contraception) in order to prevent pregnancy and STIs (sexually transmitted infections).  If told by your health care provider, take low-dose aspirin daily starting at age 61. What's next?  Visit your health care provider once a year for a well check visit.  Ask your health care provider how often you should have your eyes and teeth checked.  Stay up to date on all vaccines. This information is not intended to replace advice given to you by your health care provider. Make sure you discuss any questions you have with your health care provider. Document Revised: 02/25/2018 Document Reviewed: 02/25/2018 Elsevier Patient Education  2020 [employer].

## 2020-03-22 NOTE — Progress Notes (Signed)
Annual Exam   Chief Complaint:  Chief Complaint  Patient presents with  . Annual Exam    no concerns     History of Present Illness:  [name] is a 44 y.o. [phone] who LMP was No LMP recorded., presents today for her annual examination.     Nutrition Diet: eating once a day, snack in the morning - tries to eat healthy Exercise: more than normal, has a job at a gym She does get adequate calcium and Vitamin D in her diet.   Social History   Tobacco Use  Smoking Status Former Smoker  . Packs/day: 1.00  . Years: 25.00  . Pack years: 25.00  . Types: Cigarettes  . Quit date: 11/22/2015  . Years since quitting: 4.3  Smokeless Tobacco Never Used   Social History   Substance and Sexual Activity  Alcohol Use Yes   Comment: rare   Social History   Substance and Sexual Activity  Drug Use No    Safety The patient wears seatbelts: yes.     The patient feels safe at home and in their relationships: yes.  General Health Dentist in the last year: Yes Eye doctor: no - needs to schedule  Menstrual Sees GYN has abnormal bleeding - started birth control pills and did not have a period  GYN She is not currently sexual active single partner, contraception - OCP (estrogen/progesterone).    Cervical Cancer Screening:   Last Pap:   May 2019 Results were: no abnormalities /neg HPV DNA    Breast Cancer Screening There is no FH of breast cancer. There is no FH of ovarian cancer. BRCA screening Not Indicated.  Discussed that for average risk women between age 75-49 screening may reduce the risk of breast cancer death, however, at a lower rate than those over age 6. And that the the false-positive rates resulting in unnecessary biopsies with more screening is higher. The balance of benefits vs harms likely improves as you progress through your 40s. The patient does want a mammogram this year.   Colon Cancer Screening:  Age 52-75 yo - benefits outweigh the risk. Adults  73-85 yo who have never been screened benefit.  Benefits: 134000 people in 2016 will be diagnosed and 49,000 will die - early detection helps Harms: Complications 2/2 to colonoscopy High Risk (Colonoscopy): genetic disorder (Lynch syndrome or familial adenomatous polyposis), personal hx of IBD, previous adenomatous polyp, or previous colorectal cancer, FamHx start 10 years before the age at diagnosis, increased in males and black race  Options:  FIT - looks for hemoglobin (blood in the stool) - specific and fairly sensitive - must be done annually Cologuard - looks for DNA and blood - more sensitive - therefore can have more false positives, every 3 years Colonoscopy - every 10 years if normal - sedation, bowl prep, must have someone drive you  Shared decision making and the patient had decided to do will think about her options.  Weight Wt Readings from Last 3 Encounters:  03/22/20 160 lb (72.6 kg)  01/18/20 165 lb 9.6 oz (75.1 kg)  01/12/20 164 lb 14.5 oz (74.8 kg)   Patient has normal BMI  BMI Readings from Last 1 Encounters:  03/22/20 27.04 kg/m     Chronic disease screening Blood pressure monitoring:  BP Readings from Last 3 Encounters:  03/22/20 122/64  01/18/20 130/70  01/12/20 139/84    Lipid Monitoring: Indication for screening: age >82, obesity, diabetes, family hx, CV risk factors.  Lipid screening: Yes  Lab Results  Component Value Date   CHOL 201 (H) 02/14/2019   HDL 38 (L) 02/14/2019   LDLCALC 129 (H) 02/14/2019   TRIG 200 (H) 02/14/2019   CHOLHDL 5.3 (H) 02/14/2019     Diabetes Screening: age >72, overweight, family hx, PCOS, hx of gestational diabetes, at risk ethnicity Diabetes Screening screening: Yes  No results found for: HGBA1C   Past Medical History:  Diagnosis Date  . Abnormal Pap smear   . Anemia   . Anxiety   . Anxiety state 04/23/2011  . Cancer ([place]) 02/2018   lung rt  . Depression   . Dysmenorrhea 08/27/2011  . Dyspnea on  exertion 10/30/2015  . Essential hypertension 09/15/2012  . Family history of ASCVD (arteriosclerotic cardiovascular disease) 12/31/2016  . Family history of premature CAD 11/11/2016  . GAD (generalized anxiety disorder) 10/30/2015  . GERD (gastroesophageal reflux disease) 05/29/2014  . Headache 01/01/2017  . HSV (herpes simplex virus) infection   . Hypertension   . Initiation of Depo Provera 09/24/2011  . Lung nodule    RLL on coronary calcium score CT  . Migraine headache     Past Surgical History:  Procedure Laterality Date  . BREAST CYST ASPIRATION Right 02/10/2018   neg  . CESAREAN SECTION     x 3   . COLPOSCOPY  2010  . DILATION AND CURETTAGE OF UTERUS    . FLEXIBLE BRONCHOSCOPY N/A 03/29/2018   Procedure: PREOP  BRONCHOSCOPY;  Surgeon: Nestor Lewandowsky, MD;  Location: [facility] ORS;  Service: Thoracic;  Laterality: N/A;  . HYSTEROSCOPY WITH D & C N/A 01/12/2020   Procedure: DILATATION AND CURETTAGE /HYSTEROSCOPY;  Surgeon: Homero Fellers, MD;  Location: [facility] ORS;  Service: Gynecology;  Laterality: N/A;  . THORACOTOMY Right 03/29/2018   Procedure: THORACOTOMY MAJOR- POSSIBLE LOBECTOMY;  Surgeon: Nestor Lewandowsky, MD;  Location: [facility] ORS;  Service: Thoracic;  Laterality: Right;  . TUBAL LIGATION    . VIDEO ASSISTED THORACOSCOPY (VATS)/WEDGE RESECTION Right 03/29/2018   Procedure: VIDEO ASSISTED THORACOSCOPY (VATS)/WEDGE RESECTION;  Surgeon: Nestor Lewandowsky, MD;  Location: [facility] ORS;  Service: Thoracic;  Laterality: Right;    Prior to Admission medications   Medication Sig Start Date End Date Taking? Authorizing Provider  ALPRAZolam Duanne Moron) 0.5 MG tablet Take 1 tablet (0.5 mg total) by mouth at bedtime as needed. Patient taking differently: Take 0.5 mg by mouth at bedtime as needed for sleep.  08/01/19  Yes Lucille Passy, MD  ASHWAGANDHA PO Take 1 tablet by mouth daily.   Yes [provider]  Aspirin-Caffeine (BC FAST PAIN RELIEF PO) Take 1 packet by mouth daily as needed (headache).    Yes [provider]  ferrous sulfate (FEROSUL) 325 (65 FE) MG tablet TAKE 1 TABLET(325 MG) BY MOUTH TWICE DAILY WITH A MEAL Patient taking differently: Take 325 mg by mouth daily.  08/01/19  Yes Lucille Passy, MD  lisinopril (ZESTRIL) 10 MG tablet Take 1/2-1 qd Patient taking differently: Take 5-10 mg by mouth See admin instructions. Alternate between 5 mg and 10 mg 08/24/19  Yes Libby Maw, MD  Multiple Vitamin (MULTIVITAMIN WITH MINERALS) TABS tablet Take 2 tablets by mouth daily. Juice Plus fruit and vegetables   Yes [provider]  norethindrone (MICRONOR) 0.35 MG tablet Take 1 tablet (0.35 mg total) by mouth daily. 01/18/20  Yes Schuman, Christanna R, MD  omeprazole (PRILOSEC) 40 MG capsule TAKE 1 CAPSULE(40 MG) BY MOUTH EVERY MORNING Patient taking differently: Take 40  mg by mouth daily.  08/24/19  Yes Libby Maw, MD  Phenylephrine-APAP-guaiFENesin (TYLENOL SINUS SEVERE PO) Take 2 tablets by mouth daily as needed (sinus headaches).   Yes [provider]  valACYclovir (VALTREX) 500 MG tablet TAKE 2 TABLETS BY MOUTH TODAY, THEN 2 TABLETS TWICE DAILY AS NEEDED. Patient taking differently: Take 1,000 mg by mouth daily as needed (N/A). TAKE 2 TABLETS BY MOUTH TODAY, THEN 2 TABLETS TWICE DAILY AS NEEDED. 08/01/19  Yes Lucille Passy, MD    No Known Allergies  Gynecologic History: No LMP recorded.  Obstetric History: [id number]  Social History   Socioeconomic History  . Marital status: Married    Spouse name: Francee Piccolo  . Number of children: 3  . Years of education: High school  . Highest education level: Not on file  Occupational History  . Not on file  Tobacco Use  . Smoking status: Former Smoker    Packs/day: 1.00    Years: 25.00    Pack years: 25.00    Types: Cigarettes    Quit date: 11/22/2015    Years since quitting: 4.3  . Smokeless tobacco: Never Used  Vaping Use  . Vaping Use: Never used  Substance and Sexual Activity  . Alcohol  use: Yes    Comment: rare  . Drug use: No  . Sexual activity: Yes    Birth control/protection: Surgical    Comment: tubalization  Other Topics Concern  . 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 Difficulty of Paying Living Expenses: Not on file  Food Insecurity:   . Worried About Charity fundraiser in the Last Year: Not on file  . Ran Out of Food in the Last Year: Not on file  Transportation Needs:   . Lack of Transportation (Medical): Not on file  . Lack of Transportation (Non-Medical): Not on file  Physical Activity:   . Days of Exercise per Week: Not on file  . Minutes of Exercise per Session: Not on file  Stress:   . Feeling of Stress : Not on file  Social Connections:   . Frequency of Communication with Friends and Family: Not on file  . Frequency of Social Gatherings with Friends and Family: Not on file  . Attends Religious Services: Not on file  . Active Member of Clubs or Organizations: Not on file  . Attends Archivist Meetings: Not on file  . Marital Status: Not on file  Intimate Partner Violence:   . Fear of Current or Ex-Partner: Not on file  . Emotionally Abused: Not on file  . Physically Abused: Not on file  . Sexually Abused: Not on file    Family History  Problem Relation Age of Onset  . Stroke Mother 61       massive stroke  .  Heart disease Father 36       congestive heart failure  . Hypertension Father   .  Heart disease Brother 70       Suspected MI and SCD  . Heart disease Sister   . Stroke Sister 38  . Breast cancer Maternal Grandmother 26  . Breast cancer Cousin        x2 paternal, both alive    Review of Systems  Constitutional: Negative for chills and fever.  HENT: Negative for congestion and sore throat.   Eyes: Negative for blurred vision and double vision.  Respiratory: Negative for shortness of breath.   Cardiovascular: Negative for chest pain.  Gastrointestinal: Negative for heartburn, nausea and vomiting.  Genitourinary: Negative.   Musculoskeletal: Negative.  Negative for myalgias.  Skin: Negative for rash.  Neurological: Negative for dizziness and headaches.  Endo/Heme/Allergies: Does not bruise/bleed easily.  Psychiatric/Behavioral: Negative for depression. The patient is not nervous/anxious.      Physical Exam BP 122/64   Pulse 80   Temp 98.3 F (36.8 C) (Temporal)   Ht 5' 4.5" (1.638 m)   Wt 160 lb (72.6 kg)   SpO2 98%   BMI 27.04 kg/m    BP Readings from Last 3 Encounters:  03/22/20 122/64  01/18/20 130/70  01/12/20 139/84      Physical Exam   Results:  PHQ-9:    Office Visit from 10/13/2019 in [place] at [place]  PHQ-9 Total Score 6        Assessment: 44 y.o. [id number] female here for routine annual physical examination.  Plan: Problem List Items Addressed This Visit    None      Screening: -- Blood pressure screen normal -- cholesterol screening: will obtain -- Weight screening: overweight: continue to monitor -- Diabetes Screening: will obtain -- Nutrition: Encouraged healthy diet  The 10-year ASCVD risk score Mikey Bussing DC Jr., et al., 2013) is: 1.7%   Values used to calculate the score:     Age: 3 years     Sex: Female     Is Non-Hispanic African American: No     Diabetic: No     Tobacco smoker: No     Systolic Blood Pressure: 779 mmHg     Is BP treated: Yes     HDL Cholesterol: 38 mg/dL     Total  Cholesterol: 201 mg/dL  -- Statin therapy for Age 45-75 with CVD risk >7.5%  Psych -- Depression screening (PHQ-9):    Office Visit from 10/13/2019 in [place] at [facility]  PHQ-9 Total Score 6       Safety -- tobacco screening: not using -- alcohol screening:  low-risk usage. -- no evidence of domestic violence or intimate partner violence.   Cancer Screening -- pap smear not collected per ASCCP guidelines -- family history of breast cancer screening: done. not at high risk. -- Mammogram - advised scheduling -- Colon cancer (age 56+)-- will be due in a few months - discussed options and she will consider  Immunizations Immunization History  Administered Date(s) Administered  . Influenza-Unspecified 08/26/2017, 04/03/2018  . Moderna SARS-COVID-2 Vaccination 02/29/2020  . Tdap 11/17/2017    -- flu vaccine declined -- TDAP q10 years up to date -- Covid-19 Vaccine up to date   Encouraged healthy diet and exercise. Encouraged regular vision and dental care.   Lesleigh Noe, MD

## 2020-03-23 LAB — COMPREHENSIVE METABOLIC PANEL
ALT: 12 U/L (ref 0–35)
AST: 12 U/L (ref 0–37)
Albumin: 4.6 g/dL (ref 3.5–5.2)
Alkaline Phosphatase: 34 U/L — ABNORMAL LOW (ref 39–117)
BUN: 11 mg/dL (ref 6–23)
CO2: 28 mEq/L (ref 19–32)
Calcium: 9.6 mg/dL (ref 8.4–10.5)
Chloride: 103 mEq/L (ref 96–112)
Creatinine, Ser: 0.9 mg/dL (ref 0.40–1.20)
GFR: 67.74 mL/min (ref >60.00)
Glucose, Bld: 99 mg/dL (ref 70–99)
Potassium: 4.2 mEq/L (ref 3.5–5.1)
Sodium: 139 mEq/L (ref 135–145)
Total Bilirubin: 0.6 mg/dL (ref 0.2–1.2)
Total Protein: 6.6 g/dL (ref 6.0–8.3)

## 2020-03-23 LAB — LIPID PANEL
Cholesterol: 157 mg/dL (ref 0–200)
HDL: 32.5 mg/dL — ABNORMAL LOW (ref >39.00)
LDL Cholesterol: 101 mg/dL — ABNORMAL HIGH (ref 0–99)
NonHDL: 124.36
Total CHOL/HDL Ratio: 5
Triglycerides: 118 mg/dL (ref 0.0–149.0)
VLDL: 23.6 mg/dL (ref 0.0–40.0)

## 2020-03-23 LAB — HEPATITIS C ANTIBODY
Hepatitis C Ab: NONREACTIVE
SIGNAL TO CUT-OFF: 0.01 (ref <1.00)

## 2020-03-23 LAB — CBC
HCT: 39 % (ref 36.0–46.0)
Hemoglobin: 13 g/dL (ref 12.0–15.0)
MCHC: 33.4 g/dL (ref 30.0–36.0)
MCV: 91.9 fl (ref 78.0–100.0)
Platelets: 269 10*3/uL (ref 150.0–400.0)
RBC: 4.24 Mil/uL (ref 3.87–5.11)
RDW: 12.1 % (ref 11.5–15.5)
WBC: 11.3 10*3/uL — ABNORMAL HIGH (ref 4.0–10.5)

## 2020-03-23 LAB — TSH: TSH: 1.45 u[IU]/mL (ref 0.35–4.50)

## 2020-03-27 DIAGNOSIS — F4323 Adjustment disorder with mixed anxiety and depressed mood: Secondary | ICD-10-CM | POA: Diagnosis not present

## 2020-03-29 DIAGNOSIS — N939 Abnormal uterine and vaginal bleeding, unspecified: Secondary | ICD-10-CM

## 2020-04-05 ENCOUNTER — Other Ambulatory Visit: Payer: Self-pay | Admitting: Family Medicine

## 2020-04-05 DIAGNOSIS — Z1231 Encounter for screening mammogram for malignant neoplasm of breast: Secondary | ICD-10-CM

## 2020-04-06 ENCOUNTER — Ambulatory Visit
Admission: RE | Admit: 2020-04-06 | Discharge: 2020-04-06 | Disposition: A | Discharge status: Home / Self Care | Payer: BC Managed Care – PPO | Source: Ambulatory Visit | Attending: Internal Medicine | Admitting: Internal Medicine

## 2020-04-06 ENCOUNTER — Other Ambulatory Visit: Payer: Self-pay

## 2020-04-06 DIAGNOSIS — C3491 Malignant neoplasm of unspecified part of right bronchus or lung: Secondary | ICD-10-CM | POA: Diagnosis not present

## 2020-04-06 DIAGNOSIS — C3431 Malignant neoplasm of lower lobe, right bronchus or lung: Secondary | ICD-10-CM

## 2020-04-06 DIAGNOSIS — K838 Other specified diseases of biliary tract: Secondary | ICD-10-CM | POA: Diagnosis not present

## 2020-04-06 DIAGNOSIS — J984 Other disorders of lung: Secondary | ICD-10-CM | POA: Diagnosis not present

## 2020-04-06 MED ORDER — IOHEXOL 300 MG/ML  SOLN
75.0000 mL | Freq: Once | INTRAMUSCULAR | Status: AC | PRN
  Administered 2020-04-06: 75 mL via INTRAVENOUS

## 2020-04-09 ENCOUNTER — Ambulatory Visit: Payer: BC Managed Care – PPO | Admitting: Internal Medicine

## 2020-04-10 ENCOUNTER — Other Ambulatory Visit: Payer: Self-pay

## 2020-04-10 ENCOUNTER — Inpatient Hospital Stay: Payer: BC Managed Care – PPO | Attending: Internal Medicine | Admitting: Internal Medicine

## 2020-04-10 DIAGNOSIS — Z87891 Personal history of nicotine dependence: Secondary | ICD-10-CM | POA: Diagnosis not present

## 2020-04-10 DIAGNOSIS — F32A Depression, unspecified: Secondary | ICD-10-CM | POA: Diagnosis not present

## 2020-04-10 DIAGNOSIS — F419 Anxiety disorder, unspecified: Secondary | ICD-10-CM | POA: Insufficient documentation

## 2020-04-10 DIAGNOSIS — Z823 Family history of stroke: Secondary | ICD-10-CM | POA: Diagnosis not present

## 2020-04-10 DIAGNOSIS — J984 Other disorders of lung: Secondary | ICD-10-CM | POA: Insufficient documentation

## 2020-04-10 DIAGNOSIS — Z79899 Other long term (current) drug therapy: Secondary | ICD-10-CM | POA: Diagnosis not present

## 2020-04-10 DIAGNOSIS — Z8249 Family history of ischemic heart disease and other diseases of the circulatory system: Secondary | ICD-10-CM | POA: Insufficient documentation

## 2020-04-10 DIAGNOSIS — I10 Essential (primary) hypertension: Secondary | ICD-10-CM | POA: Diagnosis not present

## 2020-04-10 DIAGNOSIS — K219 Gastro-esophageal reflux disease without esophagitis: Secondary | ICD-10-CM | POA: Insufficient documentation

## 2020-04-10 DIAGNOSIS — C3431 Malignant neoplasm of lower lobe, right bronchus or lung: Secondary | ICD-10-CM | POA: Diagnosis not present

## 2020-04-10 DIAGNOSIS — E559 Vitamin D deficiency, unspecified: Secondary | ICD-10-CM | POA: Diagnosis not present

## 2020-04-10 DIAGNOSIS — F4323 Adjustment disorder with mixed anxiety and depressed mood: Secondary | ICD-10-CM | POA: Diagnosis not present

## 2020-04-10 DIAGNOSIS — Z803 Family history of malignant neoplasm of breast: Secondary | ICD-10-CM | POA: Diagnosis not present

## 2020-04-10 NOTE — Assessment & Plan Note (Addendum)
#   Stage I adenocarcinoma the lung/lipidic pattern.  Status post wedge resection clear margins.  CT scan October 2021--negative for any recurrence; mild thickening noted along the staple line/resection.  STABLE. Recommend imaging CT scan every 12 months for total of 5 years.   # Quit smoking-congratulated on continued smoking cessation; STABLE.   # vit D def-continue vitamin D.   # DISPOSITION: # 15 m follow up with MD-; CT chest prior- Dr.B   # I reviewed the blood work- with the patient in detail; also reviewed the imaging independently [as summarized above]; and with the patient in detail.   Cc; Dr.Taalia.

## 2020-04-10 NOTE — Progress Notes (Signed)
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  #Quit smoking [2017]  DIAGNOSIS: Right upper lobe lung cancer  STAGE: 1       ;GOALS: Cure  CURRENT/MOST RECENT THERAPY : Surveillance    Primary cancer of right lower lobe of lung ([facility])  04/07/2018 Initial Diagnosis   Primary cancer of right lower lobe of lung ([facility])      HISTORY OF PRESENTING ILLNESS:  Catherine Gilbert 44 y.o.  female history of stage I adenocarcinoma the lung is here for follow-up/review results of the CT scan.  Patient denies any worsening shortness of breath or cough.  No chest pain.  No headaches.  No bone pain.  Patient is continue to quit smoking.  Review of Systems  Constitutional: Negative for chills, diaphoresis, fever, malaise/fatigue and weight loss.  HENT: Negative for nosebleeds and sore throat.   Eyes: Negative for double vision.  Respiratory: Negative for cough, hemoptysis, sputum production, shortness of breath and wheezing.   Cardiovascular: Negative for chest pain, palpitations, orthopnea and leg swelling.  Gastrointestinal: Negative for abdominal pain, blood in stool, constipation, diarrhea, heartburn, melena, nausea and vomiting.  Genitourinary: Negative for dysuria, frequency and urgency.  Musculoskeletal: Negative for back pain and joint pain.  Skin: Negative.  Negative for itching and rash.  Neurological: Negative for dizziness, tingling, focal weakness, weakness and headaches.  Endo/Heme/Allergies: Does not bruise/bleed easily.   Psychiatric/Behavioral: Negative for depression. The patient is not nervous/anxious and does not have insomnia.      MEDICAL HISTORY:  Past Medical History:  Diagnosis Date  . Abnormal Pap smear   . Anemia   . Anxiety   . Anxiety state 04/23/2011  . Cancer ([facility]) 02/2018   lung rt  . Depression   . Dysmenorrhea 08/27/2011  . Dyspnea on exertion 10/30/2015  . Essential hypertension 09/15/2012  . Family history of ASCVD (arteriosclerotic cardiovascular disease) 12/31/2016  . Family history of premature CAD 11/11/2016  . GAD (generalized anxiety disorder) 10/30/2015  . GERD (gastroesophageal reflux disease) 05/29/2014  . Headache 01/01/2017  . HSV (herpes simplex virus) infection   . Hypertension   . Initiation of Depo Provera 09/24/2011  . Lung nodule    RLL on coronary calcium score CT  . Migraine headache     SURGICAL HISTORY: Past Surgical History:  Procedure Laterality Date  . BREAST CYST ASPIRATION Right 02/10/2018   neg  . CESAREAN SECTION     x 3   . COLPOSCOPY  2010  . DILATION AND CURETTAGE OF UTERUS    . FLEXIBLE BRONCHOSCOPY N/A 03/29/2018   Procedure: PREOP  BRONCHOSCOPY;  Surgeon: Nestor Lewandowsky, MD;  Location: [facility] ORS;  Service: Thoracic;  Laterality: N/A;  . HYSTEROSCOPY WITH D & C N/A 01/12/2020   Procedure: DILATATION AND CURETTAGE /HYSTEROSCOPY;  Surgeon: Homero Fellers, MD;  Location: [facility];  Service: Gynecology;  Laterality: N/A;  . THORACOTOMY Right 03/29/2018   Procedure: THORACOTOMY MAJOR- POSSIBLE LOBECTOMY;  Surgeon: Nestor Lewandowsky, MD;  Location: [facility];  Service: Thoracic;  Laterality: Right;  . TUBAL LIGATION    . VIDEO ASSISTED THORACOSCOPY (VATS)/WEDGE  RESECTION Right 03/29/2018   Procedure: VIDEO ASSISTED THORACOSCOPY (VATS)/WEDGE RESECTION;  Surgeon: Nestor Lewandowsky, MD;  Location: [facility];  Service: Thoracic;  Laterality: Right;    SOCIAL HISTORY: lives in [place]; 3 childeresn; quit smoking; in may 2017 [ since 15 until 1ppd].  Social  History   Socioeconomic History  . Marital status: Married    Spouse name: Francee Piccolo  . Number of children: 3  . Years of education: High school  . Highest education level: Not on file  Occupational History  . Not on file  Tobacco Use  . Smoking status: Former Smoker    Packs/day: 1.00    Years: 25.00    Pack years: 25.00    Types: Cigarettes    Quit date: 11/22/2015    Years since quitting: 4.3  . Smokeless tobacco: Never Used  Vaping Use  . Vaping Use: Never used  Substance and Sexual Activity  . Alcohol use: Yes    Comment: rare  . Drug use: No  . Sexual activity: Yes    Birth control/protection: Surgical    Comment: tubalization  Other Topics Concern  . 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 Difficulty of Paying Living Expenses: Not on file  Food Insecurity:   . Worried About Charity fundraiser in the Last Year: Not on file  . Ran Out of Food in the Last Year: Not on file  Transportation Needs:   . Lack of Transportation (Medical): Not on file  . Lack of Transportation (Non-Medical): Not on file  Physical Activity:   . Days of Exercise per Week: Not on file  . Minutes of Exercise per Session: Not on file  Stress:   . Feeling of Stress : Not on file  Social Connections:   . Frequency of Communication with Friends and Family: Not on file  . Frequency of Social Gatherings with Friends and Family: Not on file  . Attends Religious  Services: Not on file  . Active Member of Clubs or Organizations: Not on file  . Attends Archivist Meetings: Not on file  . Marital Status: Not on file  Intimate Partner Violence:   . Fear of Current or Ex-Partner: Not on file  . Emotionally Abused: Not on file  . Physically Abused: Not on file  . Sexually Abused: Not on file    FAMILY HISTORY: Family History  Problem Relation Age of Onset  . Stroke Mother 60       massive stroke  . Heart disease Father 30       congestive heart failure  . Hypertension Father   . Heart disease Brother 73       Suspected MI and SCD  . Heart disease Sister   . Stroke Sister 12  . Breast cancer Maternal  Grandmother 20  . Breast cancer Cousin        x2 paternal, both alive    ALLERGIES:  has No Known Allergies.  MEDICATIONS:  Current Outpatient Medications  Medication Sig Dispense Refill  . ALPRAZolam (XANAX) 0.5 MG tablet Take 1 tablet (0.5 mg total) by mouth at bedtime as needed. 30 tablet 5  . ASHWAGANDHA PO Take 1 tablet by mouth daily.    . Aspirin-Caffeine (BC FAST PAIN RELIEF PO) Take 1 packet by mouth daily as needed (headache).    . ferrous sulfate (FEROSUL) 325 (65 FE) MG tablet TAKE 1 TABLET(325 MG) BY MOUTH TWICE DAILY WITH A MEAL (Patient taking differently: Take 325 mg by mouth daily. ) 180 tablet 3  . lisinopril (ZESTRIL) 10 MG tablet Take 1/2-1 qd (Patient taking differently: Take 5-10 mg by mouth See admin instructions. Alternate between 5 mg and 10 mg) 90 tablet 3  . Multiple Vitamin (MULTIVITAMIN WITH MINERALS) TABS tablet Take 2 tablets by mouth daily. Juice Plus fruit and vegetables    . norethindrone (MICRONOR) 0.35 MG tablet Take 1 tablet (0.35 mg total) by mouth daily. 28 tablet 12  . omeprazole (PRILOSEC) 40 MG capsule TAKE 1 CAPSULE(40 MG) BY MOUTH EVERY MORNING (Patient taking differently: Take 40 mg by mouth daily. ) 90 capsule 3  . Phenylephrine-APAP-guaiFENesin (TYLENOL SINUS SEVERE PO) Take 2 tablets by  mouth daily as needed (sinus headaches).    . valACYclovir (VALTREX) 500 MG tablet TAKE 2 TABLETS BY MOUTH TODAY, THEN 2 TABLETS TWICE DAILY AS NEEDED. (Patient taking differently: Take 1,000 mg by mouth daily as needed (N/A). TAKE 2 TABLETS BY MOUTH TODAY, THEN 2 TABLETS TWICE DAILY AS NEEDED.) 60 tablet 5   No current facility-administered medications for this visit.      Marland Kitchen  PHYSICAL EXAMINATION: ECOG PERFORMANCE STATUS: 0 - Asymptomatic  Vitals:   04/10/20 1108  BP: 123/86  Pulse: 72  Resp: 20  Temp: 98.5 F (36.9 C)   Filed Weights   04/10/20 1108  Weight: 163 lb 12.8 oz (74.3 kg)    Physical Exam Constitutional:      Comments: Alone.  Ambulating independently.  HENT:     Head: Normocephalic and atraumatic.     Mouth/Throat:     Pharynx: No oropharyngeal exudate.  Eyes:     Pupils: Pupils are equal, round, and reactive to light.  Cardiovascular:     Rate and Rhythm: Normal rate and regular rhythm.  Pulmonary:     Effort: Pulmonary effort is normal. No respiratory distress.     Breath sounds: Normal breath sounds. No wheezing.  Abdominal:     General: Bowel sounds are normal. There is no distension.     Palpations: Abdomen is soft. There is no mass.     Tenderness: There is no abdominal tenderness. There is no guarding or rebound.  Musculoskeletal:        General: No tenderness. Normal range of motion.     Cervical back: Normal range of motion and neck supple.  Skin:    General: Skin is warm.  Neurological:     Mental Status: She is alert and oriented to person, place, and time.  Psychiatric:        Mood and Affect: Affect normal.      LABORATORY DATA:  I have reviewed the data as listed Lab Results  Component Value Date   WBC 11.3 (H) 03/22/2020   HGB 13.0 03/22/2020   HCT 39.0 03/22/2020   MCV  91.9 03/22/2020   PLT 269.0 03/22/2020   Recent Labs    04/13/19 1304 03/22/20 1438  NA 138 139  K 4.8 4.2  CL 102 103  CO2 27 28  GLUCOSE 86 99   BUN 14 11  CREATININE 0.85 0.90  CALCIUM 10.1 9.6  PROT 7.5 6.6  ALBUMIN 4.9 4.6  AST 20 12  ALT 22 12  ALKPHOS 41 34*  BILITOT 0.6 0.6    RADIOGRAPHIC STUDIES: I have personally reviewed the radiological images as listed and agreed with the findings in the report. CT CHEST W CONTRAST  Result Date: 04/06/2020 CLINICAL DATA:  Right lung cancer restaging. Prior right lower lobe wedge resection 03/29/2017. EXAM: CT CHEST WITH CONTRAST TECHNIQUE: Multidetector CT imaging of the chest was performed during intravenous contrast administration. CONTRAST:  74m OMNIPAQUE IOHEXOL 300 MG/ML  SOLN COMPARISON:  04/06/2019 FINDINGS: Cardiovascular: Unremarkable Mediastinum/Nodes: Unremarkable Lungs/Pleura: Stable scarring along the right lower lobe wedge resection line. No contour change or new nodularity. Otherwise unremarkable. Upper Abdomen: Chronically stable prominence of the common bile duct, cannot exclude type 1 choledochal cyst. Musculoskeletal: Unremarkable IMPRESSION: 1. No findings of recurrent malignancy. 2. Stable scarring along the right lower lobe wedge resection line. 3. 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 Electronically Signed   By: WVan ClinesM.D.   On: 04/06/2020 12:18    ASSESSMENT & PLAN:   [place] of right [place] lobe of lung [place] # Stage I adenocarcinoma the lung/lipidic pattern.  Status post wedge resection clear margins.  CT scan October 2021--negative for any recurrence; mild thickening noted along the staple line/resection.  STABLE. Recommend imaging CT scan every 12 months for total of 5 years.   # Quit smoking-congratulated on continued smoking cessation; STABLE.   # vit D def-continue vitamin D.   # DISPOSITION: # 179m follow up with MD-; CT chest prior- Dr.B   # I reviewed the blood work- with the patient in detail; also reviewed the imaging independently  [as summarized above]; and with the patient in detail.   Cc; Dr.Taalia.    All questions were answered. The patient knows to call the clinic with any problems, questions or concerns.   GCammie Sickle MD 04/10/2020 12:27 PM

## 2020-04-19 ENCOUNTER — Ambulatory Visit: Payer: BC Managed Care – PPO | Admitting: Obstetrics and Gynecology

## 2020-04-23 ENCOUNTER — Ambulatory Visit: Payer: BC Managed Care – PPO | Admitting: Obstetrics and Gynecology

## 2020-04-24 DIAGNOSIS — F4323 Adjustment disorder with mixed anxiety and depressed mood: Secondary | ICD-10-CM | POA: Diagnosis not present

## 2020-05-03 ENCOUNTER — Telehealth: Payer: Self-pay

## 2020-05-03 ENCOUNTER — Other Ambulatory Visit: Payer: Self-pay

## 2020-05-03 ENCOUNTER — Encounter: Payer: Self-pay | Admitting: Emergency Medicine

## 2020-05-03 ENCOUNTER — Ambulatory Visit
Admission: EM | Admit: 2020-05-03 | Discharge: 2020-05-03 | Disposition: A | Discharge status: Home / Self Care | Payer: BC Managed Care – PPO | Attending: Physician Assistant | Admitting: Physician Assistant

## 2020-05-03 DIAGNOSIS — Z85118 Personal history of other malignant neoplasm of bronchus and lung: Secondary | ICD-10-CM | POA: Insufficient documentation

## 2020-05-03 DIAGNOSIS — J029 Acute pharyngitis, unspecified: Secondary | ICD-10-CM | POA: Insufficient documentation

## 2020-05-03 DIAGNOSIS — R0981 Nasal congestion: Secondary | ICD-10-CM

## 2020-05-03 DIAGNOSIS — J069 Acute upper respiratory infection, unspecified: Secondary | ICD-10-CM | POA: Diagnosis not present

## 2020-05-03 DIAGNOSIS — Z87891 Personal history of nicotine dependence: Secondary | ICD-10-CM | POA: Insufficient documentation

## 2020-05-03 DIAGNOSIS — Z20822 Contact with and (suspected) exposure to covid-19: Secondary | ICD-10-CM | POA: Diagnosis not present

## 2020-05-03 LAB — GROUP A STREP BY PCR: Group A Strep by PCR: NOT DETECTED

## 2020-05-03 LAB — SARS CORONAVIRUS 2 (TAT 6-24 HRS): SARS Coronavirus 2: NEGATIVE

## 2020-05-03 MED ORDER — IPRATROPIUM BROMIDE 0.06 % NA SOLN: 2.0000 | Freq: Four times a day (QID) | NASAL | 12 refills | Status: DC

## 2020-05-03 NOTE — Telephone Encounter (Signed)
Noted  

## 2020-05-03 NOTE — Telephone Encounter (Signed)
Patient evaluated/treated ED/UC today.   [facility] - Client TELEPHONE ADVICE RECORD [facility] Patient Name: Catherine Gilbert Gender: Female DOB: [birth year] Age: 44 Y 2 D Return Phone Number: [phone] (Primary) Address[place] [address] Client [facility] - Client Client Site [place] - Day Physician Waunita Schooner- MD Contact Type Call Who Is Calling Patient / Member / Family / Caregiver Call Type Triage / Clinical Relationship To Patient Self Return Phone Number [phone] (Primary) Chief Complaint Sore Throat Reason for Call Symptomatic / Request for [name] states that she has a sore throat and head congestion that has been coming and going so she thought it was the weather, this morning her throat is worse and red. Additional Comment No appts avail today at her PCP office Translation No Disp. Time Eilene Ghazi Time) Disposition Final User 05/03/2020 10:26:10 AM Attempt made - message left Chestine Spore, RN, Reynold Bowen 05/03/2020 10:46:03 AM FINAL ATTEMPT MADE - message left Yes Chestine Spore, RN, Reynold Bowen Comments User: Magda Paganini, Ego Date/Time Eilene Ghazi Time): 05/03/2020 10:37:47 AM [facility] User: Magda Paganini, Ego Date/Time (Eastern Time): 05/03/2020 10:40:33 AM

## 2020-05-03 NOTE — ED Provider Notes (Signed)
[facility]    CSN: [id number] Arrival date & time: 05/03/20  1107      History   Chief Complaint Chief Complaint  Patient presents with  . Sore Throat    HPI Catherine Gilbert is a 44 y.o. female presenting for 2-day history of sore throat, nasal congestion and sneezing. Denies fever, cough, headaches, fatigue, chills, chest pain, SOB, n/v/d. She is otherwise healthy but does have history of adenocarcinoma of lung in remission. Denies COVID exposure and is fully vaccinated. She states that she has been taking over-the-counter Advil Cold and Sinus and it has really improved her symptoms.  She does not have any other complaints or concerns today.  HPI  Past Medical History:  Diagnosis Date  . Abnormal Pap smear   . Anemia   . Anxiety   . Anxiety state 04/23/2011  . Cancer ([place]) 02/2018   lung rt  . Depression   . Dysmenorrhea 08/27/2011  . Dyspnea on exertion 10/30/2015  . Essential hypertension 09/15/2012  . Family history of ASCVD (arteriosclerotic cardiovascular disease) 12/31/2016  . Family history of premature CAD 11/11/2016  . GAD (generalized anxiety disorder) 10/30/2015  . GERD (gastroesophageal reflux disease) 05/29/2014  . Headache 01/01/2017  . HSV (herpes simplex virus) infection   . Hypertension   . Initiation of Depo Provera 09/24/2011  . Lung nodule    RLL on coronary calcium score CT  . Migraine headache     Patient Active Problem List   Diagnosis Date Noted  . Abnormal uterine bleeding   . Bilateral carpal tunnel syndrome 10/13/2019  . Menorrhagia with regular cycle 08/09/2019  . Chest pain of uncertain etiology 51/70/0174  . PVC's (premature ventricular contractions) 07/27/2019  . Vitamin D deficiency 04/13/2019  . Primary adenocarcinoma of right lung ([place]) 10/08/2018  . Primary cancer of right lower lobe of lung ([place]) 04/07/2018  . Lung nodule, solitary 02/18/2017  . Family history of ASCVD (arteriosclerotic cardiovascular disease) 12/31/2016    . Family history of premature CAD 11/11/2016  . GAD (generalized anxiety disorder) 10/30/2015  . Dyspnea on exertion 10/30/2015  . GERD (gastroesophageal reflux disease) 05/29/2014  . Essential hypertension 09/15/2012  . Dysmenorrhea 08/27/2011  . Anxiety and depression 04/23/2011    Past Surgical History:  Procedure Laterality Date  . BREAST CYST ASPIRATION Right 02/10/2018   neg  . CESAREAN SECTION     x 3   . COLPOSCOPY  2010  . DILATION AND CURETTAGE OF UTERUS    . FLEXIBLE BRONCHOSCOPY N/A 03/29/2018   Procedure: PREOP  BRONCHOSCOPY;  Surgeon: Nestor Lewandowsky, MD;  Location: [facility];  Service: Thoracic;  Laterality: N/A;  . HYSTEROSCOPY WITH D & C N/A 01/12/2020   Procedure: DILATATION AND CURETTAGE /HYSTEROSCOPY;  Surgeon: Homero Fellers, MD;  Location: [facility];  Service: Gynecology;  Laterality: N/A;  . THORACOTOMY Right 03/29/2018   Procedure: THORACOTOMY MAJOR- POSSIBLE LOBECTOMY;  Surgeon: Nestor Lewandowsky, MD;  Location: [facility];  Service: Thoracic;  Laterality: Right;  . TUBAL LIGATION    . VIDEO ASSISTED THORACOSCOPY (VATS)/WEDGE RESECTION Right 03/29/2018   Procedure: VIDEO ASSISTED THORACOSCOPY (VATS)/WEDGE RESECTION;  Surgeon: Nestor Lewandowsky, MD;  Location: [facility];  Service: Thoracic;  Laterality: Right;    OB History    Gravida  4   Para  3   Term  3   Preterm      AB  1   Living  3     SAB      TAB  1   Ectopic      Multiple      Live Births               Home Medications    Prior to Admission medications   Medication Sig Start Date End Date Taking? Authorizing Provider  ALPRAZolam Duanne Moron) 0.5 MG tablet Take 1 tablet (0.5 mg total) by mouth at bedtime as needed. 03/22/20  Yes Lesleigh Noe, MD  ASHWAGANDHA PO Take 1 tablet by mouth daily.   Yes [provider]  lisinopril (ZESTRIL) 10 MG tablet Take 1/2-1 qd Patient taking differently: Take 5-10 mg by mouth See admin instructions. Alternate between 5 mg and 10 mg 08/24/19   Yes Libby Maw, MD  norethindrone (MICRONOR) 0.35 MG tablet Take 1 tablet (0.35 mg total) by mouth daily. 01/18/20  Yes Schuman, Christanna R, MD  omeprazole (PRILOSEC) 40 MG capsule TAKE 1 CAPSULE(40 MG) BY MOUTH EVERY MORNING Patient taking differently: Take 40 mg by mouth daily.  08/24/19  Yes Libby Maw, MD  valACYclovir (VALTREX) 500 MG tablet TAKE 2 TABLETS BY MOUTH TODAY, THEN 2 TABLETS TWICE DAILY AS NEEDED. Patient taking differently: Take 1,000 mg by mouth daily as needed (N/A). TAKE 2 TABLETS BY MOUTH TODAY, THEN 2 TABLETS TWICE DAILY AS NEEDED. 08/01/19  Yes Lucille Passy, MD  Aspirin-Caffeine [facility] FAST PAIN RELIEF PO) Take 1 packet by mouth daily as needed (headache).    [provider]  ferrous sulfate (FEROSUL) 325 (65 FE) MG tablet TAKE 1 TABLET(325 MG) BY MOUTH TWICE DAILY WITH A MEAL Patient taking differently: Take 325 mg by mouth daily.  08/01/19   Lucille Passy, MD  ipratropium (ATROVENT) 0.06 % nasal spray Place 2 sprays into both nostrils 4 (four) times daily. 05/03/20   Danton Clap, PA-C  Multiple Vitamin (MULTIVITAMIN WITH MINERALS) TABS tablet Take 2 tablets by mouth daily. Juice Plus fruit and vegetables    [provider]  Phenylephrine-APAP-guaiFENesin (TYLENOL SINUS SEVERE PO) Take 2 tablets by mouth daily as needed (sinus headaches).    [provider]    Family History Family History  Problem Relation Age of Onset  . Stroke Mother 18       massive stroke  . Heart disease Father 52       congestive heart failure  . Hypertension Father   . Heart disease Brother 23       Suspected MI and SCD  . Heart disease Sister   . Stroke Sister 53  . Breast cancer Maternal Grandmother 56  . Breast cancer Cousin        x2 paternal, both alive    Social History Social History   Tobacco Use  . Smoking status: Former Smoker    Packs/day: 1.00    Years: 25.00    Pack years: 25.00    Types: Cigarettes    Quit date:  11/22/2015    Years since quitting: 4.4  . Smokeless tobacco: Never Used  Vaping Use  . Vaping Use: Never used  Substance Use Topics  . Alcohol use: Yes    Comment: rare  . Drug use: No     Allergies   Patient has no known allergies.   Review of Systems Review of Systems  Constitutional: Negative for chills, diaphoresis, fatigue and fever.  HENT: Positive for congestion, postnasal drip, rhinorrhea, sneezing and sore throat. Negative for ear pain, sinus pressure and sinus pain.   Respiratory: Negative for cough and shortness  of breath.   Gastrointestinal: Negative for abdominal pain, nausea and vomiting.  Musculoskeletal: Negative for arthralgias and myalgias.  Skin: Negative for rash.  Neurological: Negative for weakness and headaches.  Hematological: Negative for adenopathy.     Physical Exam Triage Vital Signs ED Triage Vitals  Enc Vitals Group     BP 05/03/20 1157 (!) 144/91     Pulse Rate 05/03/20 1157 70     Resp 05/03/20 1157 18     Temp 05/03/20 1157 98.3 F (36.8 C)     Temp Source 05/03/20 1157 Oral     SpO2 05/03/20 1157 100 %     Weight 05/03/20 1154 163 lb 12.8 oz (74.3 kg)     Height 05/03/20 1154 5' 4.5" (1.638 m)     Head Circumference --      Peak Flow --      Pain Score 05/03/20 1154 7     Pain Loc --      Pain Edu? --      Excl. in [place]? --    No data found.  Updated Vital Signs BP (!) 144/91 (BP Location: Left Arm)   Pulse 70   Temp 98.3 F (36.8 C) (Oral)   Resp 18   Ht 5' 4.5" (1.638 m)   Wt 163 lb 12.8 oz (74.3 kg)   LMP 05/02/2020 (Exact Date)   SpO2 100%   BMI 27.68 kg/m       Physical Exam Vitals and nursing note reviewed.  Constitutional:      General: She is not in acute distress.    Appearance: Normal appearance. She is not ill-appearing or toxic-appearing.  HENT:     Head: Normocephalic and atraumatic.     Nose: Congestion and rhinorrhea present.     Mouth/Throat:     Mouth: Mucous membranes are moist.     Pharynx:  Oropharynx is clear. Posterior oropharyngeal erythema (large amount of post nasal drainage) present.  Eyes:     General: No scleral icterus.       Right eye: No discharge.        Left eye: No discharge.     Conjunctiva/sclera: Conjunctivae normal.  Cardiovascular:     Rate and Rhythm: Normal rate and regular rhythm.     Heart sounds: Normal heart sounds.  Pulmonary:     Effort: Pulmonary effort is normal. No respiratory distress.     Breath sounds: Normal breath sounds.  Musculoskeletal:     Cervical back: Neck supple.  Skin:    General: Skin is dry.  Neurological:     General: No focal deficit present.     Mental Status: She is alert. Mental status is at baseline.     Motor: No weakness.     Gait: Gait normal.  Psychiatric:        Mood and Affect: Mood normal.        Behavior: Behavior normal.        Thought Content: Thought content normal.      UC Treatments / Results  Labs (all labs ordered are listed, but only abnormal results are displayed) Labs Reviewed  GROUP A STREP BY PCR  SARS CORONAVIRUS 2 (TAT 6-24 HRS)    EKG   Radiology No results found.  Procedures Procedures (including critical care time)  Medications Ordered in UC Medications - No data to display  Initial Impression / Assessment and Plan / UC Course  I have reviewed the triage vital signs and the nursing notes.  Pertinent labs & imaging results that were available during my care of the patient were reviewed by me and considered in my medical decision making (see chart for details).    Negative strep test. COVID test obtained. CDC guidelines, isolation protocol, and ED precautions. Advised rest, isolation, fluids and continuing OTC cold/sinus medication and nasal spray. F/u with our dept as needed.    Final Clinical Impressions(s) / UC Diagnoses   Final diagnoses:  Acute upper respiratory infection  Sore throat  Nasal congestion     Discharge Instructions     URI/COLD SYMPTOMS: Your  exam today is consistent with a viral illness. Antibiotics are not indicated at this time. Use medications as directed, including cough syrup, nasal saline, and decongestants. Your symptoms should improve over the next few days and resolve within 7-10 days. Increase rest and fluids. F/u if symptoms worsen or predominate such as sore throat, ear pain, productive cough, shortness of breath, or if you develop high fevers or worsening fatigue over the next several days.    You have received COVID testing today either for positive exposure, concerning symptoms that could be related to COVID infection, screening purposes, or re-testing after confirmed positive.  Your test obtained today checks for active viral infection in the last 1-2 weeks. If your test is negative now, you can still test positive later. So, if you do develop symptoms you should either get re-tested and/or isolate x 10 days. Please follow CDC guidelines.  While Rapid antigen tests come back in 15-20 minutes, send out PCR/molecular test results typically come back within 24 hours. In the mean time, if you are symptomatic, assume this could be a positive test and treat/monitor yourself as if you do have COVID.   We will call with test results. Please download the MyChart app and set up a profile to access test results.   If symptomatic, go home and rest. Push fluids. Take Tylenol as needed for discomfort. Gargle warm salt water. Throat lozenges. Take Mucinex DM or Robitussin for cough. Humidifier in bedroom to ease coughing. Warm showers. Also review the COVID handout for more information.  COVID-19 INFECTION: The incubation period of COVID-19 is approximately 14 days after exposure, with most symptoms developing in roughly 4-5 days. Symptoms may range in severity from mild to critically severe. Roughly 80% of those infected will have mild symptoms. People of any age may become infected with COVID-19 and have the ability to transmit the virus.  The most common symptoms include: fever, fatigue, cough, body aches, headaches, sore throat, nasal congestion, shortness of breath, nausea, vomiting, diarrhea, changes in smell and/or taste.    COURSE OF ILLNESS Some patients may begin with mild disease which can progress quickly into critical symptoms. If your symptoms are worsening please call ahead to the Emergency Department and proceed there for further treatment. Recovery time appears to be roughly 1-2 weeks for mild symptoms and 3-6 weeks for severe disease.   GO IMMEDIATELY TO ER FOR FEVER YOU ARE UNABLE TO GET DOWN WITH TYLENOL, BREATHING PROBLEMS, CHEST PAIN, FATIGUE, LETHARGY, INABILITY TO EAT OR DRINK, ETC  QUARANTINE AND ISOLATION: To help decrease the spread of COVID-19 please remain isolated if you have COVID infection or are highly suspected to have COVID infection. This means -stay home and isolate to one room in the home if you live with others. Do not share a bed or bathroom with others while ill, sanitize and wipe down all countertops and keep common areas clean and disinfected.  You may discontinue isolation if you have a mild case and are asymptomatic 10 days after symptom onset as long as you have been fever free >24 hours without having to take Motrin or Tylenol. If your case is more severe (meaning you develop pneumonia or are admitted in the hospital), you may have to isolate longer.   If you have been in close contact (within 6 feet) of someone diagnosed with COVID 19, you are advised to quarantine in your home for 14 days as symptoms can develop anywhere from 2-14 days after exposure to the virus. If you develop symptoms, you  must isolate.  Most current guidelines for COVID after exposure -isolate 10 days if you ARE NOT tested for COVID as long as symptoms do not develop -isolate 7 days if you are tested and remain asymptomatic -You do not necessarily need to be tested for COVID if you have + exposure and        develop    symptoms. Just isolate at home x10 days from symptom onset During this global pandemic, CDC advises to practice social distancing, try to stay at least 68f away from others at all times. Wear a face covering. Wash and sanitize your hands regularly and avoid going anywhere that is not necessary.  KEEP IN MIND THAT THE COVID TEST IS NOT 100% ACCURATE AND YOU SHOULD STILL DO EVERYTHING TO PREVENT POTENTIAL SPREAD OF VIRUS TO OTHERS (WEAR MASK, WEAR GLOVES, [place]S AND SANITIZE REGULARLY). IF INITIAL TEST IS NEGATIVE, THIS MAY NOT MEAN YOU ARE DEFINITELY NEGATIVE. MOST ACCURATE TESTING IS DONE 5-7 DAYS AFTER EXPOSURE.   It is not advised by CDC to get re-tested after receiving a positive COVID test since you can still test positive for weeks to months after you have already cleared the virus.   *If you have not been vaccinated for COVID, I strongly suggest you consider getting vaccinated as long as there are no contraindications.      ED Prescriptions    Medication Sig Dispense Auth. Provider   ipratropium (ATROVENT) 0.06 % nasal spray Place 2 sprays into both nostrils 4 (four) times daily. 15 mL EDanton Clap PA-C     PDMP not reviewed this encounter.   EDanton Clap PA-C 05/03/20 1317

## 2020-05-03 NOTE — Discharge Instructions (Addendum)

## 2020-05-03 NOTE — ED Triage Notes (Signed)
Pt c/o sore throat, nasal congestion, sneezing. Started about 2 days ago. She states this morning her throat was worse. Denies fever. She has had covid vaccine. She declines covid testing.

## 2020-05-08 DIAGNOSIS — F4323 Adjustment disorder with mixed anxiety and depressed mood: Secondary | ICD-10-CM | POA: Diagnosis not present

## 2020-05-22 DIAGNOSIS — F4323 Adjustment disorder with mixed anxiety and depressed mood: Secondary | ICD-10-CM | POA: Diagnosis not present

## 2020-06-12 DIAGNOSIS — F4323 Adjustment disorder with mixed anxiety and depressed mood: Secondary | ICD-10-CM | POA: Diagnosis not present

## 2020-07-10 DIAGNOSIS — F4323 Adjustment disorder with mixed anxiety and depressed mood: Secondary | ICD-10-CM | POA: Diagnosis not present

## 2020-07-11 ENCOUNTER — Other Ambulatory Visit: Payer: Self-pay

## 2020-07-11 ENCOUNTER — Telehealth (INDEPENDENT_AMBULATORY_CARE_PROVIDER_SITE_OTHER): Payer: BC Managed Care – PPO | Admitting: Family Medicine

## 2020-07-11 ENCOUNTER — Encounter: Payer: Self-pay | Admitting: Family Medicine

## 2020-07-11 VITALS — HR 47 | Wt 165.0 lb

## 2020-07-11 DIAGNOSIS — F411 Generalized anxiety disorder: Secondary | ICD-10-CM

## 2020-07-11 DIAGNOSIS — R001 Bradycardia, unspecified: Secondary | ICD-10-CM | POA: Diagnosis not present

## 2020-07-11 NOTE — Patient Instructions (Signed)
Take zoloft 50 mg for 1 week Increase to 100 mg after 1 week   MyChart if a 1-2 weeks if you feel like your symptoms are worse

## 2020-07-11 NOTE — Assessment & Plan Note (Signed)
Asymptomatic and while sleeping. Advised continued monitoring and can reevaluate in the office at next visit.

## 2020-07-11 NOTE — Progress Notes (Signed)
    I connected with [name] on 07/11/20 at 10:40 AM EST by video and verified that I am speaking with the correct person using two identifiers.   I discussed the limitations, risks, security and privacy concerns of performing an evaluation and management service by video and the availability of in person appointments. I also discussed with the patient that there may be a patient responsible charge related to this service. The patient expressed understanding and agreed to proceed.  Patient location: parked car Provider Location: [place] Participants: Lesleigh Noe and [name]   Subjective:     Catherine Gilbert is a 45 y.o. female presenting for Anxiety (Constant X 1 week. Started sertraline 100 mg again on 07/09/20)     HPI   #Anxiety  - no new stress in her life - work is up and down with stress - typically things are stable - restarted zoloft 07/09/20 at 100 mg - had been doing well on ashwagandha - anxiety has come on so quick - has been taking 5200 mg   Failed wellbutrin Has not tried fluoxetine Tried lexapro - thinks this may have helped  #bradycardia - when sleeping - no lightheadedness or dizziness  Review of Systems   Social History   Tobacco Use  Smoking Status Former Smoker  . Packs/day: 1.00  . Years: 25.00  . Pack years: 25.00  . Types: Cigarettes  . Quit date: 11/22/2015  . Years since quitting: 4.6  Smokeless Tobacco Never Used        Objective:   BP Readings from Last 3 Encounters:  05/03/20 (!) 144/91  04/10/20 123/86  03/22/20 122/64   Wt Readings from Last 3 Encounters:  07/11/20 165 lb (74.8 kg)  05/03/20 163 lb 12.8 oz (74.3 kg)  04/10/20 163 lb 12.8 oz (74.3 kg)   Pulse (!) 47   Wt 165 lb (74.8 kg)   BMI 27.88 kg/m    GAD 7 : Generalized Anxiety Score 03/22/2020 04/13/2019 10/19/2018 10/04/2018  Nervous, Anxious, on Edge 0 0 1 3  Control/stop worrying 0 0 0 3  Worry too much - different things 1 0 0 2   Trouble relaxing 0 0 0 0  Restless 0 0 0 0  Easily annoyed or irritable 1 1 1 2   Afraid - awful might happen 0 0 0 3  Total GAD 7 Score 2 1 2 13   Anxiety Difficulty Not difficult at all Not difficult at all Not difficult at all Very difficult          Assessment & Plan:   Problem List Items Addressed This Visit      Other   GAD (generalized anxiety disorder) - Primary    Restart zoloft at 50 mg x 1 week then increase to 100 mg. Break from ashwagandha. Cont prn xanax but limit use. If symptoms worsening can try prozac or lexapro      Relevant Medications   sertraline (ZOLOFT) 100 MG tablet   Bradycardia    Asymptomatic and while sleeping. Advised continued monitoring and can reevaluate in the office at next visit.           Return in about 6 weeks (around 08/22/2020).  Lesleigh Noe, MD

## 2020-07-11 NOTE — Assessment & Plan Note (Signed)
Restart zoloft at 50 mg x 1 week then increase to 100 mg. Break from ashwagandha. Cont prn xanax but limit use. If symptoms worsening can try prozac or lexapro

## 2020-07-16 ENCOUNTER — Ambulatory Visit: Payer: BC Managed Care – PPO | Admitting: Family Medicine

## 2020-07-23 DIAGNOSIS — Z03818 Encounter for observation for suspected exposure to other biological agents ruled out: Secondary | ICD-10-CM | POA: Diagnosis not present

## 2020-07-23 DIAGNOSIS — Z20822 Contact with and (suspected) exposure to covid-19: Secondary | ICD-10-CM | POA: Diagnosis not present

## 2020-07-24 ENCOUNTER — Encounter: Payer: Self-pay | Admitting: Family Medicine

## 2020-07-27 NOTE — Progress Notes (Signed)
Cardiology Office Note    Date:  07/31/2020   ID:  [name], DOB [birth year], MRN [id number]  PCP:  Lesleigh Noe, MD  Cardiologist:  Nelva Bush, MD  Electrophysiologist:  None   Chief Complaint: Follow up  History of Present Illness:   Catherine Gilbert is a 45 y.o. female with history of right lower lobe lung adenocarcinoma status post wedge resection in 2019 with prior tobacco use, HTN, headache disorder, depression, and anxiety who presents for follow-up of chest pain or shortness of breath.  Prior ETT on 01/10/2017 showed good exercise capacity with normal heart rate and blood pressure response without symptoms of angina.  There were no significant ST-T changes during stress or recovery.  There were no significant arrhythmias.  Overall, this was a low risk ETT.  Calcium score on 01/25/2017 of 0.  She was last seen in the office in 07/2019 for follow-up and was doing well with continued occasional right-sided chest discomfort near her thoracotomy site.  She continued to note shortness of breath with activity such as walking her dog.  She also noted occasional skipped beats and had rhythm strips from her Apple Watch that demonstrated occasional PVCs.  It was noted her prior PCP had attempted to titrate her off antihypertensive medication though with this she had a spike in her BP.  Given ongoing exertional shortness of breath, she underwent echo on 08/31/2019 showed an EF of 50 to 55%, normal LV diastolic function parameters, normal RV systolic function and ventricular cavity size, no significant valvular abnormalities, and an estimated right atrial pressure of 3 mmHg.  Weight loss and exercise were encouraged.  She comes in doing overall well from a cardiac perspective since she was last seen.  She indicated she was in her usual state of health up until approximately 1 month ago when she developed symptoms of worsening stress/anxiety with possible associated palpitations.  With this, she  restarted Zoloft and discontinued ashwagandha.  With this she noted an improvement in her overall symptoms though has continued to note a fluttering sensation in her chest that will occur on a daily basis lasting for several minutes.  Prior to reinitiating Zoloft these symptoms would last all day.  No associated chest pain or dyspnea.  Her weight is down 10 pounds today when compared to her last clinic visit in 07/2019.  She does report a history of snoring with possible apneic episodes and indicates her apple watch has demonstrated her heart rate will dip into the upper 30s bpm while sleeping.  No prior sleep study.   Labs independently reviewed: 02/2020 - TSH normal, TC 137, TG 118, HDL 32, LDL 101, Hgb 13.0, PLT 269, potassium 4.2, BUN 11, serum creatinine 0.9, albumin 4.6, AST/ALT normal  Past Medical History:  Diagnosis Date  . Abnormal Pap smear   . Anemia   . Anxiety   . Anxiety state 04/23/2011  . Cancer ([place]) 02/2018   lung rt  . Depression   . Dysmenorrhea 08/27/2011  . Dyspnea on exertion 10/30/2015  . Essential hypertension 09/15/2012  . Family history of ASCVD (arteriosclerotic cardiovascular disease) 12/31/2016  . Family history of premature CAD 11/11/2016  . GAD (generalized anxiety disorder) 10/30/2015  . GERD (gastroesophageal reflux disease) 05/29/2014  . Headache 01/01/2017  . HSV (herpes simplex virus) infection   . Hypertension   . Initiation of Depo Provera 09/24/2011  . Lung nodule    RLL on coronary calcium score CT  . Migraine headache  Past Surgical History:  Procedure Laterality Date  . BREAST CYST ASPIRATION Right 02/10/2018   neg  . CESAREAN SECTION     x 3   . COLPOSCOPY  2010  . DILATION AND CURETTAGE OF UTERUS    . FLEXIBLE BRONCHOSCOPY N/A 03/29/2018   Procedure: PREOP  BRONCHOSCOPY;  Surgeon: Nestor Lewandowsky, MD;  Location: [facility] ORS;  Service: Thoracic;  Laterality: N/A;  . HYSTEROSCOPY WITH D & C N/A 01/12/2020   Procedure: DILATATION AND CURETTAGE  /HYSTEROSCOPY;  Surgeon: Homero Fellers, MD;  Location: [facility] OR[facility];  Service: Gynecology;  Laterality: N/A;  . THORACOTOMY Right 03/29/2018   Procedure: THORACOTOMY MAJOR- POSSIBLE LOBECTOMY;  Surgeon: Nestor Lewandowsky, MD;  Location: [facility] ORS;  Service: Thoracic;  Laterality: Right;  . TUBAL LIGATION    . VIDEO ASSISTED THORACOSCOPY (VATS)/WEDGE RESECTION Right 03/29/2018   Procedure: VIDEO ASSISTED THORACOSCOPY (VATS)/WEDGE RESECTION;  Surgeon: Nestor Lewandowsky, MD;  Location: [facility] OR[facility];  Service: Thoracic;  Laterality: Right;    Current Medications: Current Meds  Medication Sig  . ALPRAZolam (XANAX) 0.5 MG tablet Take 1 tablet (0.5 mg total) by mouth at bedtime as needed.  . Aspirin-Caffeine (BC FAST PAIN RELIEF PO) Take 1 packet by mouth daily as needed (headache).  . lisinopril (ZESTRIL) 10 MG tablet Take 1/2-1 qd (Patient taking differently: Take 5-10 mg by mouth See admin instructions. Alternate between 5 mg and 10 mg)  . Multiple Vitamin (MULTIVITAMIN WITH MINERALS) TABS tablet Take 2 tablets by mouth daily. Juice Plus fruit and vegetables  . omeprazole (PRILOSEC) 40 MG capsule TAKE 1 CAPSULE(40 MG) BY MOUTH EVERY MORNING (Patient taking differently: Take 40 mg by mouth daily.)  . sertraline (ZOLOFT) 100 MG tablet Take 100 mg by mouth daily.  . valACYclovir (VALTREX) 500 MG tablet TAKE 2 TABLETS BY MOUTH TODAY, THEN 2 TABLETS TWICE DAILY AS NEEDED. (Patient taking differently: Take 1,000 mg by mouth daily as needed (N/A). TAKE 2 TABLETS BY MOUTH TODAY, THEN 2 TABLETS TWICE DAILY AS NEEDED.)    Allergies:   Patient has no known allergies.   Social History   Socioeconomic History  . Marital status: Married    Spouse name: Francee Piccolo  . Number of children: 3  . Years of education: High school  . Highest education level: Not on file  Occupational History  . Not on file  Tobacco Use  . Smoking status: Former Smoker    Packs/day: 1.00    Years: 25.00    Pack years: 25.00    Types:  Cigarettes    Quit date: 11/22/2015    Years since quitting: 4.6  . Smokeless tobacco: Never Used  Vaping Use  . Vaping Use: Never used  Substance and Sexual Activity  . Alcohol use: Yes    Comment: rare  . Drug use: No  . Sexual activity: Yes    Birth control/protection: Surgical    Comment: tubalization  Other Topics Concern  . 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      Family: not close with family that are living, but good relationship with children      Enjoys: binge watch TV, crystals - metaphysical studies      Exercise: not currently   Diet: pretty good, lunch - salads, home cooking most of the time      Land  belts: Yes    Guns: Yes  and secure   Safe in relationships: Yes    Social Determinants of Health   Financial Resource Strain: Not on file  Food Insecurity: Not on file  Transportation Needs: Not on file  Physical Activity: Not on file  Stress: Not on file  Social Connections: Not on file     Family History:  The patient's family history includes Breast cancer in her cousin; Breast cancer (age of onset: 56) in her maternal grandmother; Heart disease in her sister; Heart disease (age of onset: 42) in her brother; Heart disease (age of onset: 52) in her father; Hypertension in her father; Stroke (age of onset: 29) in her sister; Stroke (age of onset: 56) in her mother.  ROS:   Review of Systems  Constitutional: Positive for malaise/fatigue. Negative for chills, diaphoresis, fever and weight loss.  HENT: Negative for congestion.   Eyes: Negative for discharge and redness.  Respiratory: Negative for cough, hemoptysis, sputum production, shortness of breath and wheezing.   Cardiovascular: Positive for palpitations. Negative for chest pain, orthopnea, claudication, leg swelling and PND.  Gastrointestinal:  Negative for abdominal pain, heartburn, nausea and vomiting.  Musculoskeletal: Negative for falls and myalgias.  Skin: Negative for rash.  Neurological: Negative for dizziness, tingling, tremors, sensory change, speech change, focal weakness, loss of consciousness and weakness.  Endo/Heme/Allergies: Does not bruise/bleed easily.  Psychiatric/Behavioral: Negative for substance abuse. The patient is nervous/anxious.   All other systems reviewed and are negative.    EKGs/Labs/Other Studies Reviewed:    Studies reviewed were summarized above. The additional studies were reviewed today:  ETT 12/2016:  Good exercise capacity with normal heart rate and blood pressure response. No angina reported.  No significant ST-segment or T-wave abnormalities during stress or recovery; baseline EKG shows non-specific T-wave changes.  No significant arrhythmias.  Low risk exercise tolerance test (D[facility] Treadmill Score = 9) __________  Calcium score 12/2016: IMPRESSION: Coronary calcium score of 0. See radiology note regarding f/u lung Nodule __________  2D echo 08/2019: 1. Left ventricular ejection fraction, by estimation, is 50 to 55%. The  left ventricle has low normal function. Left ventricular endocardial  border not optimally defined to evaluate regional wall motion. Left  ventricular diastolic parameters were  normal.  2. Right ventricular systolic function is normal. The right ventricular  size is normal.  3. The mitral valve is grossly normal. No evidence of mitral valve  regurgitation.  4. The aortic valve is normal in structure and function. Aortic valve  regurgitation is not visualized.  5. Pulmonic valve regurgitation not assessed.  6. The inferior vena cava is normal in size with greater than 50%  respiratory variability, suggesting right atrial pressure of 3 mmHg.   EKG:  EKG is ordered today.  The EKG ordered today demonstrates NSR, 73 bpm, normal axis, nonspecific ST-T  changes, no significant changes when compared to prior tracing  Recent Labs: 03/22/2020: ALT 12; BUN 11; Creatinine, Ser 0.90; Hemoglobin 13.0; Platelets 269.0; Potassium 4.2; Sodium 139; TSH 1.45  Recent Lipid Panel    Component Value Date/Time   CHOL 157 03/22/2020 1438   TRIG 118.0 03/22/2020 1438   HDL 32.50 (L) 03/22/2020 1438   CHOLHDL 5 03/22/2020 1438   VLDL 23.6 03/22/2020 1438   LDLCALC 101 (H) 03/22/2020 1438   LDLCALC 129 (H) 02/14/2019 1352    PHYSICAL EXAM:    VS:  BP 140/80 (BP Location: Left Arm, Patient Position: Sitting, Cuff Size: Normal)  Pulse 73   Ht 5' 4.5" (1.638 m)   Wt 160 lb (72.6 kg)   SpO2 98%   BMI 27.04 kg/m   BMI: Body mass index is 27.04 kg/m.  Physical Exam Vitals reviewed.  Constitutional:      Appearance: She is well-developed and well-nourished.  HENT:     Head: Normocephalic and atraumatic.  Eyes:     General:        Right eye: No discharge.        Left eye: No discharge.  Neck:     Vascular: No JVD.  Cardiovascular:     Rate and Rhythm: Normal rate and regular rhythm.     Pulses: No midsystolic click and no opening snap.          Posterior tibial pulses are 2+ on the right side and 2+ on the left side.     Heart sounds: Normal heart sounds, S1 normal and S2 normal. Heart sounds not distant. No murmur heard. No friction rub.  Pulmonary:     Effort: Pulmonary effort is normal. No respiratory distress.     Breath sounds: Normal breath sounds. No decreased breath sounds, wheezing or rales.  Chest:     Chest wall: No tenderness.  Abdominal:     General: There is no distension.     Palpations: Abdomen is soft.     Tenderness: There is no abdominal tenderness.  Musculoskeletal:        General: No edema.     Cervical back: Normal range of motion.  Skin:    General: Skin is warm and dry.     Nails: There is no clubbing or cyanosis.  Neurological:     Mental Status: She is alert and oriented to person, place, and time.   Psychiatric:        Mood and Affect: Mood and affect normal.        Speech: Speech normal.        Behavior: Behavior normal.        Thought Content: Thought content normal.        Judgment: Judgment normal.     Wt Readings from Last 3 Encounters:  07/31/20 160 lb (72.6 kg)  07/11/20 165 lb (74.8 kg)  05/03/20 163 lb 12.8 oz (74.3 kg)     ASSESSMENT & PLAN:   1. Exertional dyspnea/chest pain: Not currently an issue.  Cardiac work-up including ETT, calcium score, and echo have been normal.  Chest discomfort has been atypical and felt to be related to her prior right-sided thoracotomy site.  Recommend continue weight loss and increase in activity level/functional status.  2. HTN: Blood pressure is mildly elevated in the office today though review of epic indicates her blood pressures have recently been controlled.  She does indicate this is a good reading for her.  Deferred titration of lisinopril at this time given isolated reading and with the recommendation ahe undergo a sleep study initially.  3. Palpitations/PVCs: Nonischemic ETT.  Echo with an EF of 55%.  Symptoms of palpitations possibly related to stress/anxiety.  Place Zio patch.    4. Sleep disordered breathing: Refer to pulmonology for consideration of sleep study.  Disposition: F/u with Dr. Saunders Revel or an APP in 2 months.   Medication Adjustments/Labs and Tests Ordered: Current medicines are reviewed at length with the patient today.  Concerns regarding medicines are outlined above. Medication changes, Labs and Tests ordered today are summarized above and listed in the Patient Instructions accessible in  Encounters.   Signed, Christell Faith, PA-C 07/31/2020 12:26 PM     [facility] [address] [place] [address] [place], [place] [address] [phone]

## 2020-07-31 ENCOUNTER — Ambulatory Visit: Payer: BC Managed Care – PPO | Admitting: Physician Assistant

## 2020-07-31 ENCOUNTER — Encounter: Payer: Self-pay | Admitting: Physician Assistant

## 2020-07-31 ENCOUNTER — Other Ambulatory Visit: Payer: Self-pay

## 2020-07-31 ENCOUNTER — Ambulatory Visit (INDEPENDENT_AMBULATORY_CARE_PROVIDER_SITE_OTHER): Payer: BC Managed Care – PPO

## 2020-07-31 VITALS — BP 140/80 | HR 73 | Ht 64.5 in | Wt 160.0 lb

## 2020-07-31 DIAGNOSIS — R0609 Other forms of dyspnea: Secondary | ICD-10-CM

## 2020-07-31 DIAGNOSIS — G4730 Sleep apnea, unspecified: Secondary | ICD-10-CM

## 2020-07-31 DIAGNOSIS — R079 Chest pain, unspecified: Secondary | ICD-10-CM | POA: Diagnosis not present

## 2020-07-31 DIAGNOSIS — I493 Ventricular premature depolarization: Secondary | ICD-10-CM

## 2020-07-31 DIAGNOSIS — I10 Essential (primary) hypertension: Secondary | ICD-10-CM

## 2020-07-31 DIAGNOSIS — R002 Palpitations: Secondary | ICD-10-CM | POA: Diagnosis not present

## 2020-07-31 DIAGNOSIS — R0600 Dyspnea, unspecified: Secondary | ICD-10-CM

## 2020-07-31 NOTE — Patient Instructions (Addendum)
Medication Instructions:  No changes  *If you need a refill on your cardiac medications before your next appointment, please call your pharmacy*   Lab Work: None  If you have labs (blood work) drawn today and your tests are completely normal, you will receive your results only by: Marland Kitchen MyChart Message (if you have MyChart) OR . A paper copy in the mail If you have any lab test that is abnormal or we need to change your treatment, we will call you to review the results.   Testing/Procedures: Your physician has recommended that you wear a Zio monitor. This monitor is a medical device that records the heart's electrical activity. Doctors most often use these monitors to diagnose arrhythmias. Arrhythmias are problems with the speed or rhythm of the heartbeat. The monitor is a small device applied to your chest. You can wear one while you do your normal daily activities. While wearing this monitor if you have any symptoms to push the button and record what you felt. Once you have worn this monitor for the period of time provider prescribed (Usually 14 days), you will return the monitor device in the postage paid box. Once it is returned they will download the data collected and provide [place] with a report which the provider will then review and we will call you with those results. Important tips:  1. Avoid showering during the first 24 hours of wearing the monitor. 2. Avoid excessive sweating to help maximize wear time. 3. Do not submerge the device, no hot tubs, and no swimming pools. 4. Keep any lotions or oils away from the patch. 5. After 24 hours you may shower with the patch on. Take brief showers with your back facing the shower head.  6. Do not remove patch once it has been placed because that will interrupt data and decrease adhesive wear time. 7. Push the button when you have any symptoms and write down what you were feeling. 8. Once you have completed wearing your monitor, remove and place  into box which has postage paid and place in your outgoing mailbox.  9. If for some reason you have misplaced your box then call our office and we can provide another box and/or mail it off for you.         Follow-Up: At [facility], you and your health needs are our priority.  As part of our continuing mission to provide you with exceptional heart care, we have created designated Provider Care Teams.  These Care Teams include your primary Cardiologist (physician) and Advanced Practice Providers (APPs -  Physician Assistants and Nurse Practitioners) who all work together to provide you with the care you need, when you need it.   Your next appointment:   2 month(s)  The format for your next appointment:   In Person  Provider:   Nelva Bush, MD or Christell Faith, PA-C   Referral placed for sleep disorder breathing for sleep study.

## 2020-08-07 DIAGNOSIS — F4323 Adjustment disorder with mixed anxiety and depressed mood: Secondary | ICD-10-CM | POA: Diagnosis not present

## 2020-08-16 ENCOUNTER — Ambulatory Visit (INDEPENDENT_AMBULATORY_CARE_PROVIDER_SITE_OTHER): Payer: BC Managed Care – PPO | Admitting: Pulmonary Disease

## 2020-08-16 ENCOUNTER — Encounter: Payer: Self-pay | Admitting: Pulmonary Disease

## 2020-08-16 VITALS — BP 124/78 | HR 88 | Temp 97.3°F | Ht 64.5 in | Wt 162.0 lb

## 2020-08-16 DIAGNOSIS — R0600 Dyspnea, unspecified: Secondary | ICD-10-CM

## 2020-08-16 DIAGNOSIS — F32A Depression, unspecified: Secondary | ICD-10-CM

## 2020-08-16 DIAGNOSIS — R0609 Other forms of dyspnea: Secondary | ICD-10-CM

## 2020-08-16 DIAGNOSIS — Z87891 Personal history of nicotine dependence: Secondary | ICD-10-CM | POA: Insufficient documentation

## 2020-08-16 DIAGNOSIS — F419 Anxiety disorder, unspecified: Secondary | ICD-10-CM | POA: Diagnosis not present

## 2020-08-16 DIAGNOSIS — G4733 Obstructive sleep apnea (adult) (pediatric): Secondary | ICD-10-CM | POA: Insufficient documentation

## 2020-08-16 DIAGNOSIS — Z9189 Other specified personal risk factors, not elsewhere classified: Secondary | ICD-10-CM | POA: Diagnosis not present

## 2020-08-16 NOTE — Assessment & Plan Note (Signed)
Former smoker Twenty-five-pack-year smoking history Status post wedge resection  Plan: May need to consider pulmonary function testing in the future We will continue to clinically monitor at this point in time

## 2020-08-16 NOTE — Progress Notes (Signed)
Reviewed and agree with assessment/plan.   Chesley Mires, MD [facility] Pulmonary/Critical Care 08/16/2020, 6:26 PM Pager:  [phone]

## 2020-08-16 NOTE — Assessment & Plan Note (Signed)
Twenty-five-pack-year smoking history  Plan: May need to consider pulmonary function testing the future

## 2020-08-16 NOTE — Assessment & Plan Note (Signed)
Plan: Continue to work with primary care for management of anxiety

## 2020-08-16 NOTE — Patient Instructions (Addendum)
You were seen today by Lauraine Rinne, NP  for:   1. At risk for obstructive sleep apnea  - Home sleep test; Future - Split night study; Future  We will order home sleep study to rule out obstructive sleep apnea  I will go ahead and order a split-night sleep study just in case your insurance denies the home sleep study   We recommend today:  Orders Placed This Encounter  Procedures  . Home sleep test    Standing Status:   Future    Standing Expiration Date:   08/16/2021    Order Specific Question:   Where should this test be performed:    Answer:   [facility]  . Split night study    Standing Status:   Future    Standing Expiration Date:   02/13/2021    Order Specific Question:   Where should this test be performed:    Answer:   [facility]   Orders Placed This Encounter  Procedures  . Home sleep test  . Split night study   No orders of the defined types were placed in this encounter.   Follow Up:    Return in about 2 months (around 10/14/2020), or if symptoms worsen or fail to improve, for [facility].   Notification of test results are managed in the following manner: If there are  any recommendations or changes to the  plan of care discussed in office today,  we will contact you and let you know what they are. If you do not hear from [place], then your results are normal and you can view them through your  MyChart account , or a letter will be sent to you. Thank you again for trusting [place] with your care  - Thank you, [facility]    It is flu season:   >>> Best ways to protect herself from the flu: Receive the yearly flu vaccine, practice good hand hygiene washing with soap and also using hand sanitizer when available, eat a nutritious meals, get adequate rest, hydrate appropriately       Please contact the office if your symptoms worsen or you have concerns that you are not improving.   Thank you for choosing [facility] for your  healthcare, and for allowing [place] to partner with you on your healthcare journey. I am thankful to be able to provide care to you today.   Wyn Quaker FNP-C

## 2020-08-16 NOTE — Assessment & Plan Note (Signed)
Plan: We will order home sleep study  Discussed case with [facility].  Patient's insurance may deny home sleep study.  We will also place order for split-night sleep study just in case.  Would prefer patient to have home sleep study.

## 2020-08-16 NOTE — Progress Notes (Signed)
@Patient  ID: Catherine Gilbert, female    DOB: [birth year], 45 y.o.   MRN: [id number]  Chief Complaint  Patient presents with  . sleep consult    Per Thurmond Butts Dunn--no prior sleep study. C/o loud snoring, stops breathing during sleep, restless sleep and restless sleep.     Referring provider: Sindy Messing  HPI:  45 year old female former smoker referred to our office on 08/16/2020 for evaluation of sleep disordered breathing  PMH: Anxiety, depression, hypertension, GERD, PVCs Smoker/ Smoking History: Former smoker. 25 pack year smoker. Quit 2017.  Maintenance: None Pt of: Needs outpatient pulmonologist/sleep doc  08/16/2020  - Visit   45 year old female presenting to office today as a sleep consult.  Referred by cardiology on 07/31/2020.  Assessment and plan from that cardiology office visit is listed below:  ASSESSMENT & PLAN:   1. Exertional dyspnea/chest pain: Not currently an issue.  Cardiac work-up including ETT, calcium score, and echo have been normal.  Chest discomfort has been atypical and felt to be related to her prior right-sided thoracotomy site.  Recommend continue weight loss and increase in activity level/functional status.  2. HTN: Blood pressure is mildly elevated in the office today though review of epic indicates her blood pressures have recently been controlled.  She does indicate this is a good reading for her.  Deferred titration of lisinopril at this time given isolated reading and with the recommendation ahe undergo a sleep study initially.  3. Palpitations/PVCs: Nonischemic ETT.  Echo with an EF of 55%.  Symptoms of palpitations possibly related to stress/anxiety.  Place Zio patch.    4. Sleep disordered breathing: Refer to pulmonology for consideration of sleep study.  SLEEP ROS   Epworth score today: 2   Do you feel you have non-refreshing sleep?: no  Daytime sleepiness?: no  Witnessed apneas?: yes Loud snoring?: yes Choking or gasping episodes  that wake pt up from sleep?: no  Does patient experienced sleepiness passenger in a car, lying down to rest in the afternoons, sitting and reading, watching TV or other social situations?:no  Bedtime is typically around: 10-11pm Sleep latency?: 33min  Which position does the patient sleep in: sides usually, wind up on back  Nocturnal awakenings?: sometimes, toss and turn some  Nightmares?: no  Sleep talking?: yes Restless Legs?: rare, but sometimes When this patient out of bed in the morning?: 630am  When wake up to you feel tired, treated any dryness in the mouth, morning headaches?: no   Patient denies any formal diagnosis of obstructive sleep apnea for her family members.  She reports that her mom and dad do both snore but no formal diagnosis of OSA.   Questionaires / Pulmonary Flowsheets:   ACT:  No flowsheet data found.  MMRC: No flowsheet data found.  Epworth:  Results of the Epworth flowsheet 08/16/2020  Sitting and reading 0  Watching TV 1  Sitting, inactive in a public place (e.g. a theatre or a meeting) 0  As a passenger in a car for an hour without a break 0  Lying down to rest in the afternoon when circumstances permit 1  Sitting and talking to someone 0  Sitting quietly after a lunch without alcohol 0  In a car, while stopped for a few minutes in traffic 0  Total score 2    Tests:   03/22/2020-TSH-1.45  04/06/2020-CT chest with contrast-no findings of recurrent malignancy, stable scarring along right lower lobe wedge resection line, chronically stable prominence of common  bile duct  08/31/2019-echocardiogram-LV ejection fraction 50 to 55%, right ventricular systolic function normal, right ventricular size is normal  FENO:  No results found for: NITRICOXIDE  PFT: No flowsheet data found.  WALK:  No flowsheet data found.  Imaging: No results found.  Lab Results:  CBC    Component Value Date/Time   WBC 11.3 (H) 03/22/2020 1438   RBC 4.24 03/22/2020  1438   HGB 13.0 03/22/2020 1438   HCT 39.0 03/22/2020 1438   PLT 269.0 03/22/2020 1438   MCV 91.9 03/22/2020 1438   MCH 31.9 01/10/2020 0905   MCHC 33.4 03/22/2020 1438   RDW 12.1 03/22/2020 1438   LYMPHSABS 2.4 04/13/2019 1304   MONOABS 0.9 04/13/2019 1304   EOSABS 0.1 04/13/2019 1304   BASOSABS 0.0 04/13/2019 1304    BMET    Component Value Date/Time   NA 139 03/22/2020 1438   K 4.2 03/22/2020 1438   CL 103 03/22/2020 1438   CO2 28 03/22/2020 1438   GLUCOSE 99 03/22/2020 1438   BUN 11 03/22/2020 1438   CREATININE 0.90 03/22/2020 1438   CREATININE 0.74 02/14/2019 1352   CALCIUM 9.6 03/22/2020 1438   GFRNONAA >60 03/30/2018 0438   GFRAA >60 03/30/2018 0438    BNP No results found for: BNP  ProBNP No results found for: PROBNP  Specialty Problems      Pulmonary Problems   Dyspnea on exertion   Lung nodule, solitary   Primary cancer of right lower lobe of lung ([place])   Primary adenocarcinoma of right lung ([facility])      No Known Allergies  Immunization History  Administered Date(s) Administered  . Influenza-Unspecified 08/26/2017, 04/03/2018  . Moderna Sars-Covid-2 Vaccination 02/29/2020, 03/29/2020  . Tdap 11/17/2017    Past Medical History:  Diagnosis Date  . Abnormal Pap smear   . Anemia   . Anxiety   . Anxiety state 04/23/2011  . Cancer ([place]) 02/2018   lung rt  . Depression   . Dysmenorrhea 08/27/2011  . Dyspnea on exertion 10/30/2015  . Essential hypertension 09/15/2012  . Family history of ASCVD (arteriosclerotic cardiovascular disease) 12/31/2016  . Family history of premature CAD 11/11/2016  . GAD (generalized anxiety disorder) 10/30/2015  . GERD (gastroesophageal reflux disease) 05/29/2014  . Headache 01/01/2017  . HSV (herpes simplex virus) infection   . Hypertension   . Initiation of Depo Provera 09/24/2011  . Lung nodule    RLL on coronary calcium score CT  . Migraine headache     Tobacco History: Social History   Tobacco Use  Smoking Status  Former Smoker  . Packs/day: 1.00  . Years: 25.00  . Pack years: 25.00  . Types: Cigarettes  . Quit date: 11/22/2015  . Years since quitting: 4.7  Smokeless Tobacco Never Used   Counseling given: Not Answered   Continue to not smoke  Outpatient Encounter Medications as of 08/16/2020  Medication Sig  . ALPRAZolam (XANAX) 0.5 MG tablet Take 1 tablet (0.5 mg total) by mouth at bedtime as needed.  . Aspirin-Caffeine (BC FAST PAIN RELIEF PO) Take 1 packet by mouth daily as needed (headache).  . lisinopril (ZESTRIL) 10 MG tablet Take 1/2-1 qd (Patient taking differently: Take 5-10 mg by mouth See admin instructions. Alternate between 5 mg and 10 mg)  . Multiple Vitamin (MULTIVITAMIN WITH MINERALS) TABS tablet Take 2 tablets by mouth daily. Juice Plus fruit and vegetables  . omeprazole (PRILOSEC) 40 MG capsule TAKE 1 CAPSULE(40 MG) BY MOUTH EVERY MORNING (Patient taking  differently: Take 40 mg by mouth daily.)  . sertraline (ZOLOFT) 100 MG tablet Take 100 mg by mouth daily.  . valACYclovir (VALTREX) 500 MG tablet TAKE 2 TABLETS BY MOUTH TODAY, THEN 2 TABLETS TWICE DAILY AS NEEDED. (Patient taking differently: Take 1,000 mg by mouth daily as needed (N/A). TAKE 2 TABLETS BY MOUTH TODAY, THEN 2 TABLETS TWICE DAILY AS NEEDED.)   No facility-administered encounter medications on file as of 08/16/2020.     Review of Systems  Review of Systems  Constitutional: Negative for activity change, fatigue and fever.  HENT: Negative for sinus pressure, sinus pain and sore throat.   Respiratory: Positive for shortness of breath. Negative for cough and wheezing.   Cardiovascular: Negative for chest pain and palpitations.  Gastrointestinal: Negative for diarrhea, nausea and vomiting.  Musculoskeletal: Negative for arthralgias.  Neurological: Negative for dizziness.  Psychiatric/Behavioral: Positive for sleep disturbance. The patient is not nervous/anxious.      Physical Exam  BP 124/78 (BP Location:  Left Arm, Cuff Size: Normal)   Pulse 88   Temp (!) 97.3 F (36.3 C) (Temporal)   Ht 5' 4.5" (1.638 m)   Wt 162 lb (73.5 kg)   SpO2 98%   BMI 27.38 kg/m   Wt Readings from Last 5 Encounters:  08/16/20 162 lb (73.5 kg)  07/31/20 160 lb (72.6 kg)  07/11/20 165 lb (74.8 kg)  05/03/20 163 lb 12.8 oz (74.3 kg)  04/10/20 163 lb 12.8 oz (74.3 kg)    BMI Readings from Last 5 Encounters:  08/16/20 27.38 kg/m  07/31/20 27.04 kg/m  07/11/20 27.88 kg/m  05/03/20 27.68 kg/m  04/10/20 27.68 kg/m     Physical Exam Vitals and nursing note reviewed.  Constitutional:      General: She is not in acute distress.    Appearance: Normal appearance. She is normal weight.  HENT:     Head: Normocephalic and atraumatic.     Right Ear: External ear normal.     Left Ear: External ear normal.     Nose: Nose normal. No congestion.     Mouth/Throat:     Mouth: Mucous membranes are moist.     Pharynx: Oropharynx is clear.  Eyes:     Pupils: Pupils are equal, round, and reactive to light.  Cardiovascular:     Rate and Rhythm: Normal rate and regular rhythm.     Pulses: Normal pulses.     Heart sounds: Normal heart sounds. No murmur heard.   Pulmonary:     Breath sounds: Normal breath sounds. No decreased air movement. No decreased breath sounds, wheezing or rales.  Musculoskeletal:     Cervical back: Normal range of motion.  Skin:    General: Skin is warm and dry.     Capillary Refill: Capillary refill takes less than 2 seconds.  Neurological:     General: No focal deficit present.     Mental Status: She is alert and oriented to person, place, and time. Mental status is at baseline.     Gait: Gait normal.  Psychiatric:        Mood and Affect: Mood normal.        Behavior: Behavior normal.        Thought Content: Thought content normal.        Judgment: Judgment normal.       Assessment & Plan:   At risk for obstructive sleep apnea Plan: We will order home sleep  study  Discussed case with [facility].  Patient's insurance may deny home sleep study.  We will also place order for split-night sleep study just in case.  Would prefer patient to have home sleep study.  Anxiety and depression Plan: Continue to work with primary care for management of anxiety  Dyspnea on exertion Former smoker Twenty-five-pack-year smoking history Status post wedge resection  Plan: May need to consider pulmonary function testing in the future We will continue to clinically monitor at this point in time  Former smoker Twenty-five-pack-year smoking history  Plan: May need to consider pulmonary function testing the future    Return in about 2 months (around 10/14/2020), or if symptoms worsen or fail to improve, for [facility].   Lauraine Rinne, NP 08/16/2020   This appointment required 32 minutes of patient care (this includes precharting, chart review, review of results, face-to-face care, etc.).

## 2020-08-17 DIAGNOSIS — R002 Palpitations: Secondary | ICD-10-CM | POA: Diagnosis not present

## 2020-08-24 ENCOUNTER — Telehealth: Payer: Self-pay

## 2020-08-24 NOTE — Telephone Encounter (Signed)
Received secure epic message from Romualdo Bolk, NP.  Patient would like update on sleep study.  She has not been contacted to schedule.  Order placed 08/16/2020.  Rodena Piety, can you help with this? Thanks

## 2020-08-27 NOTE — Telephone Encounter (Signed)
I have spoke with Mrs.  Rought and she has been scheduled to pick up HST machine on 08/29/20 @ 4:30pm

## 2020-08-29 ENCOUNTER — Other Ambulatory Visit: Payer: Self-pay

## 2020-08-29 ENCOUNTER — Ambulatory Visit: Payer: BC Managed Care – PPO

## 2020-08-29 DIAGNOSIS — Z9189 Other specified personal risk factors, not elsewhere classified: Secondary | ICD-10-CM

## 2020-08-29 DIAGNOSIS — G4733 Obstructive sleep apnea (adult) (pediatric): Secondary | ICD-10-CM | POA: Diagnosis not present

## 2020-08-30 ENCOUNTER — Other Ambulatory Visit: Payer: Self-pay | Admitting: Family Medicine

## 2020-08-30 DIAGNOSIS — K219 Gastro-esophageal reflux disease without esophagitis: Secondary | ICD-10-CM

## 2020-08-31 DIAGNOSIS — G4733 Obstructive sleep apnea (adult) (pediatric): Secondary | ICD-10-CM | POA: Diagnosis not present

## 2020-09-07 ENCOUNTER — Telehealth: Payer: Self-pay | Admitting: Primary Care

## 2020-09-07 ENCOUNTER — Other Ambulatory Visit: Payer: Self-pay

## 2020-09-07 ENCOUNTER — Ambulatory Visit (INDEPENDENT_AMBULATORY_CARE_PROVIDER_SITE_OTHER): Payer: BC Managed Care – PPO | Admitting: Primary Care

## 2020-09-07 ENCOUNTER — Encounter: Payer: Self-pay | Admitting: Primary Care

## 2020-09-07 DIAGNOSIS — G4733 Obstructive sleep apnea (adult) (pediatric): Secondary | ICD-10-CM

## 2020-09-07 NOTE — Progress Notes (Signed)
Reviewed and agree with assessment/plan.   Chesley Mires, MD [facility] Pulmonary/Critical Care 09/07/2020, 11:33 AM Pager:  [phone]

## 2020-09-07 NOTE — Telephone Encounter (Signed)
I called and spoke with Catherine Gilbert and if we can place an order for Dr. Augustina Mood for oral appliance. Dr. Toy Cookey has office in [place]

## 2020-09-07 NOTE — Patient Instructions (Addendum)
Home sleep study showed that you have moderate obstructive sleep apnea. On average you had 16 apnea/hyponea events an hours. Lowest your oxygen went was 81%, baseline it was 92%   Treatment options including weight loss, side sleeping position, oral appliance, CPAP therapy or referral to ENT for possible surgical options  Untreated sleep apnea puts patient at higher risk for cardiovascular disease, cardiac arrhythmias, stroke, pulmonary HTN, diabetes and daytime accidents  We will look into referral for [place] area, otherwise, would recommend Dr. Augustina Mood or Dr. Oneal Grout in [place]   Attaching patient education about sleep apnea and CPAP for your reference   Follow-up 6 months with our office or sooner if needed     Sleep Apnea Sleep apnea affects breathing during sleep. It causes breathing to stop for a short time or to become shallow. It can also increase the risk of:  Heart attack.  Stroke.  Being very overweight (obese).  Diabetes.  Heart failure.  Irregular heartbeat. The goal of treatment is to help you breathe normally again. What are the causes? There are three kinds of sleep apnea:  Obstructive sleep apnea. This is caused by a blocked or collapsed airway.  Central sleep apnea. This happens when the brain does not send the right signals to the muscles that control breathing.  Mixed sleep apnea. This is a combination of obstructive and central sleep apnea. The most common cause of this condition is a collapsed or blocked airway. This can happen if:  Your throat muscles are too relaxed.  Your tongue and tonsils are too large.  You are overweight.  Your airway is too small.   What increases the risk?  Being overweight.  Smoking.  Having a small airway.  Being older.  Being female.  Drinking alcohol.  Taking medicines to calm yourself (sedatives or tranquilizers).  Having family members with the condition. What are the signs or  symptoms?  Trouble staying asleep.  Being sleepy or tired during the day.  Getting angry a lot.  Loud snoring.  Headaches in the morning.  Not being able to focus your mind (concentrate).  Forgetting things.  Less interest in sex.  Mood swings.  Personality changes.  Feelings of sadness (depression).  Waking up a lot during the night to pee (urinate).  Dry mouth.  Sore throat. How is this diagnosed?  Your medical history.  A physical exam.  A test that is done when you are sleeping (sleep study). The test is most often done in a sleep lab but may also be done at home. How is this treated?  Sleeping on your side.  Using a medicine to get rid of mucus in your nose (decongestant).  Avoiding the use of alcohol, medicines to help you relax, or certain pain medicines (narcotics).  Losing weight, if needed.  Changing your diet.  Not smoking.  Using a machine to open your airway while you sleep, such as: ? An oral appliance. This is a mouthpiece that shifts your lower jaw forward. ? A CPAP device. This device blows air through a mask when you breathe out (exhale). ? An EPAP device. This has valves that you put in each nostril. ? A BPAP device. This device blows air through a mask when you breathe in (inhale) and breathe out.  Having surgery if other treatments do not work. It is important to get treatment for sleep apnea. Without treatment, it can lead to:  High blood pressure.  Coronary artery disease.  In men,  not being able to have an erection (impotence).  Reduced thinking ability.   Follow these instructions at home: Lifestyle  Make changes that your doctor recommends.  Eat a healthy diet.  Lose weight if needed.  Avoid alcohol, medicines to help you relax, and some pain medicines.  Do not use any products that contain nicotine or tobacco, such as cigarettes, e-cigarettes, and chewing tobacco. If you need help quitting, ask your  doctor. General instructions  Take over-the-counter and prescription medicines only as told by your doctor.  If you were given a machine to use while you sleep, use it only as told by your doctor.  If you are having surgery, make sure to tell your doctor you have sleep apnea. You may need to bring your device with you.  Keep all follow-up visits as told by your doctor. This is important. Contact a doctor if:  The machine that you were given to use during sleep bothers you or does not seem to be working.  You do not get better.  You get worse. Get help right away if:  Your chest hurts.  You have trouble breathing in enough air.  You have an uncomfortable feeling in your back, arms, or stomach.  You have trouble talking.  One side of your body feels weak.  A part of your face is hanging down. These symptoms may be an emergency. Do not wait to see if the symptoms will go away. Get medical help right away. Call your local emergency services (911 in the [place] Do not drive yourself to the hospital. Summary  This condition affects breathing during sleep.  The most common cause is a collapsed or blocked airway.  The goal of treatment is to help you breathe normally while you sleep. This information is not intended to replace advice given to you by your health care provider. Make sure you discuss any questions you have with your health care provider. Document Revised: 04/02/2018 Document Reviewed: 02/09/2018 Elsevier Patient Education  2021 [employer].   CPAP and BPAP Information CPAP and BPAP are methods that use air pressure to keep your airways open and to help you breathe well. CPAP and BPAP use different amounts of pressure. Your health care provider will tell you whether CPAP or BPAP would be more helpful for you.  CPAP stands for "continuous positive airway pressure." With CPAP, the amount of pressure stays the same while you breathe in and out.  BPAP stands for  "bi-level positive airway pressure." With BPAP, the amount of pressure will be higher when you breathe in (inhale) and lower when you breathe out(exhale). This allows you to take larger breaths. CPAP or BPAP may be used in the hospital, or your health care provider may want you to use it at home. You may need to have a sleep study before your health care provider can order a machine for you to use at home. Why are CPAP and BPAP treatments used? CPAP or BPAP can be helpful if you have:  Sleep apnea.  Chronic obstructive pulmonary disease (COPD).  Heart failure.  Medical conditions that cause muscle weakness, including muscular dystrophy or amyotrophic lateral sclerosis (ALS).  Other problems that cause breathing to be shallow, weak, abnormal, or difficult. CPAP and BPAP are most commonly used for obstructive sleep apnea (OSA) to keep the airways from collapsing when the muscles relax during sleep. How is CPAP or BPAP administered? Both CPAP and BPAP are provided by a small machine with a flexible  plastic tube that attaches to a plastic mask that you wear. Air is blown through the mask into your nose or mouth. The amount of pressure that is used to blow the air can be adjusted on the machine. Your health care provider will set the pressure setting and help you find the best mask for you. When should CPAP or BPAP be used? In most cases, the mask only needs to be worn during sleep. Generally, the mask needs to be worn throughout the night and during any daytime naps. People with certain medical conditions may also need to wear the mask at other times when they are awake. Follow instructions from your health care provider about when to use the machine. What are some tips for using the mask?  Because the mask needs to be snug, some people feel trapped or closed-in (claustrophobic) when first using the mask. If you feel this way, you may need to get used to the mask. One way to do this is to hold the  mask loosely over your nose or mouth and then gradually apply the mask more snugly. You can also gradually increase the amount of time that you use the mask.  Masks are available in various types and sizes. If your mask does not fit well, talk with your health care provider about getting a different one. Some common types of masks include: ? Full face masks, which fit over the mouth and nose. ? Nasal masks, which fit over the nose. ? Nasal pillow or prong masks, which fit into the nostrils.  If you are using a mask that fits over your nose and you tend to breathe through your mouth, a chin strap may be applied to help keep your mouth closed.  Some CPAP and BPAP machines have alarms that may sound if the mask comes off or develops a leak.  If you have trouble with the mask, it is very important that you talk with your health care provider about finding a way to make the mask easier to tolerate. Do not stop using the mask. There could be a negative impact to your health if you stop using the mask.   What are some tips for using the machine?  Place your CPAP or BPAP machine on a secure table or stand near an electrical outlet.  Know where the on/off switch is on the machine.  Follow instructions from your health care provider about how to set the pressure on your machine and when you should use it.  Do not eat or drink while the CPAP or BPAP machine is on. Food or fluids could get pushed into your lungs by the pressure of the CPAP or BPAP.  For home use, CPAP and BPAP machines can be rented or purchased through home health care companies. Many different brands of machines are available. Renting a machine before purchasing may help you find out which particular machine works well for you. Your insurance may also decide which machine you may get.  Keep the CPAP or BPAP machine and attachments clean. Ask your health care provider for specific instructions. Follow these instructions at home:  Do  not use any products that contain nicotine or tobacco, such as cigarettes, e-cigarettes, and chewing tobacco. If you need help quitting, ask your health care provider.  Keep all follow-up visits as told by your health care provider. This is important. Contact a health care provider if:  You have redness or pressure sores on your head, face, mouth, or  nose from the mask or head gear.  You have trouble using the CPAP or BPAP machine.  You cannot tolerate wearing the CPAP or BPAP mask.  Someone tells you that you snore even when wearing your CPAP or BPAP. Get help right away if:  You have trouble breathing.  You feel confused. Summary  CPAP and BPAP are methods that use air pressure to keep your airways open and to help you breathe well.  You may need to have a sleep study before your health care provider can order a machine for home use.  If you have trouble with the mask, it is very important that you talk with your health care provider about finding a way to make the mask easier to tolerate. Do not stop using the mask. There could be a negative impact to your health if you stop using the mask.  Follow instructions from your health care provider about when to use the machine. This information is not intended to replace advice given to you by your health care provider. Make sure you discuss any questions you have with your health care provider. Document Revised: 07/08/2019 Document Reviewed: 07/11/2019 Elsevier Patient Education  2021 [employer].

## 2020-09-07 NOTE — Progress Notes (Signed)
Virtual Visit via Telephone Note  I connected with Catherine Gilbert on 09/07/20 at 10:30 AM EST by telephone and verified that I am speaking with the correct person using two identifiers.  Location: Patient: Home  Provider: Office    I discussed the limitations, risks, security and privacy concerns of performing an evaluation and management service by telephone and the availability of in person appointments. I also discussed with the patient that there may be a patient responsible charge related to this service. The patient expressed understanding and agreed to proceed.   History of Present Illness: 45 year old female, former smoker. PMH significant for HTN, cardiac arrhythmia, sleep disordered breathing, adenocarcinoma right lung, GERD, anxiety/depression. Patient referred by cardiology to [facility] for sleep consult in February 2022.   09/07/2020 Patient contacted today to review sleep study results. Symptoms include loud snoring, witnessed apnea, restless sleep and fatigue. HST 08/29/20 showed moderate obstructive sleep apnea; AHI 16.6/hr with SpO2 low 81%. Discussed treatment options including weight loss, side sleeping position, oral appliance, CPAP therapy or referral to ENT for possible surgical options. Reviewed how untreated sleep apnea puts patient at higher risk for cardiovascular disease, cardiac arrhythmias, stroke, pulmonary HTN, diabetes and daytime accidents. She is not significantly overweight. She is interested in oral appliance. Would like referral to orthodontists in [place] area.    Observations/Objective:  - Able to speak in full sentences; no overt shortness of breath/wheezing  Assessment and Plan:  Moderate sleep apnea: - HST 08/29/20- AHI 16.6/hr with SpO2 low 83% - Reviewed treatment options and risk untreated sleep apnea. Patient is interested in oral appliance for management of OSA. We will look into referral for [place] area, otherwise, would recommend Dr.  Augustina Mood or Dr. Oneal Grout  - Encouraged patient to continue to maintain health weight, sleep on side as much as possible and not drive if experiencing excessive daytime fatigue   Follow Up Instructions:   - 6 months  I discussed the assessment and treatment plan with the patient. The patient was provided an opportunity to ask questions and all were answered. The patient agreed with the plan and demonstrated an understanding of the instructions.   The patient was advised to call back or seek an in-person evaluation if the symptoms worsen or if the condition fails to improve as anticipated.  I provided 25 minutes of non-face-to-face time during this encounter.   Martyn Ehrich, NP

## 2020-09-07 NOTE — Telephone Encounter (Signed)
Is there an orthodontic dentist in the [place] area that we refer patients to for OSA???

## 2020-09-07 NOTE — Telephone Encounter (Signed)
Beth please advise if you are ok with [place] placing order for Dr. Toy Cookey

## 2020-09-10 NOTE — Telephone Encounter (Signed)
Referral for Dr Toy Cookey was made.

## 2020-09-10 NOTE — Addendum Note (Signed)
Addended by: Rosana Berger on: 09/10/2020 09:45 AM   Modules accepted: Orders

## 2020-09-10 NOTE — Telephone Encounter (Signed)
Yes I am ok with that

## 2020-09-20 ENCOUNTER — Other Ambulatory Visit: Payer: Self-pay | Admitting: Family Medicine

## 2020-09-20 ENCOUNTER — Encounter: Payer: Self-pay | Admitting: Family Medicine

## 2020-09-20 ENCOUNTER — Other Ambulatory Visit: Payer: Self-pay

## 2020-09-20 DIAGNOSIS — K219 Gastro-esophageal reflux disease without esophagitis: Secondary | ICD-10-CM

## 2020-09-20 MED ORDER — SERTRALINE HCL 100 MG PO TABS: 150.0000 mg | ORAL_TABLET | Freq: Every day | ORAL | 1 refills | Status: DC

## 2020-09-20 MED ORDER — OMEPRAZOLE 40 MG PO CPDR: 40.0000 mg | DELAYED_RELEASE_CAPSULE | Freq: Every day | ORAL | 3 refills | Status: DC

## 2020-09-20 MED ORDER — LISINOPRIL 10 MG PO TABS: ORAL_TABLET | ORAL | 3 refills | Status: DC

## 2020-09-28 ENCOUNTER — Ambulatory Visit
Admission: RE | Admit: 2020-09-28 | Discharge: 2020-09-28 | Disposition: A | Discharge status: Home / Self Care | Payer: BC Managed Care – PPO | Source: Ambulatory Visit | Attending: Family Medicine | Admitting: Family Medicine

## 2020-09-28 ENCOUNTER — Other Ambulatory Visit: Payer: Self-pay

## 2020-09-28 DIAGNOSIS — Z1231 Encounter for screening mammogram for malignant neoplasm of breast: Secondary | ICD-10-CM | POA: Diagnosis not present

## 2020-10-02 ENCOUNTER — Other Ambulatory Visit: Payer: Self-pay | Admitting: Family Medicine

## 2020-10-02 DIAGNOSIS — N6320 Unspecified lump in the left breast, unspecified quadrant: Secondary | ICD-10-CM

## 2020-10-02 DIAGNOSIS — R928 Other abnormal and inconclusive findings on diagnostic imaging of breast: Secondary | ICD-10-CM

## 2020-10-03 ENCOUNTER — Ambulatory Visit: Payer: BC Managed Care – PPO | Admitting: Physician Assistant

## 2020-10-10 NOTE — Progress Notes (Signed)
Cardiology Office Note    Date:  10/12/2020   ID:  LAMEKA DISLA, DOB Nov 06, 1975, MRN [id number]  PCP:  Lesleigh Noe, MD  Cardiologist:  Nelva Bush, MD  Electrophysiologist:  None   Chief Complaint: Follow-up  History of Present Illness:   PAMLEA FINDER is a 45 y.o. female with history of right lower lobe lung adenocarcinoma status post wedge resection in 2019 with prior tobacco use, HTN, recently diagnosed OSA, headache disorder, depression, and anxiety who presents for follow-up of Zio patch.  Prior ETT on 01/10/2017 showed good exercise capacity with normal heart rate and blood pressure response without symptoms of angina.  There were no significant ST-T changes during stress or recovery.  There were no significant arrhythmias.  Overall, this was a low risk ETT.  Calcium score on 01/25/2017 of 0.  She was seen in the office in 07/2019 for follow-up and was doing well with continued occasional right-sided chest discomfort near her thoracotomy site.  She continued to note shortness of breath with activity such as walking her dog.  She also noted occasional skipped beats and had rhythm strips from her Apple Watch that demonstrated occasional PVCs.  It was noted her prior PCP had attempted to titrate her off antihypertensive medication though with this she had a spike in her BP.  Given ongoing exertional shortness of breath, she underwent echo on 08/31/2019 showed an EF of 50 to 55%, normal LV diastolic function parameters, normal RV systolic function and ventricular cavity size, no significant valvular abnormalities, and an estimated right atrial pressure of 3 mmHg.  Weight loss and exercise were encouraged.  She was last seen in the office on 07/31/2020 and was doing reasonably well.  She did note a 1 month history of worsening stress/anxiety with possible associated palpitations.  Upon restarting Zoloft, her symptoms were overall improved though she did continue to note palpitations.  In this  setting, she underwent outpatient cardiac monitoring which demonstrated a predominant rhythm of sinus with an average rate of 84 bpm (range 54 to 154 bpm), rare PACs and PVCs, episodes of ventricular bigeminy and trigeminy were noted, and no sustained arrhythmias or prolonged pauses.  Patient triggered events corresponded to sinus rhythm with PVCs.  She was referred to pulmonology with a sleep study showing moderate OSA.  She is interested in being evaluated for an oral appliance for management of this.  She comes in doing well from a cardiac perspective.  She did note while she was wearing the above outpatient cardiac monitor her palpitations were improved.  Since she was last seen she has continued to note these palpitations, particularly at nighttime when laying down to rest.  No associated chest pain, dyspnea, dizziness, presyncope, or syncope.  She does try and limit her caffeine to one Monster or Bang drink per day.   Labs independently reviewed: 02/2020 - TSH normal, TC 137, TG 118, HDL 32, LDL 101, Hgb 13.0, PLT 269, potassium 4.2, BUN 11, serum creatinine 0.9, albumin 4.6, AST/ALT normal    Past Medical History:  Diagnosis Date  . Abnormal Pap smear   . Anemia   . Anxiety   . Anxiety state 04/23/2011  . Cancer ([facility]) 02/2018   lung rt  . Depression   . Dysmenorrhea 08/27/2011  . Dyspnea on exertion 10/30/2015  . Essential hypertension 09/15/2012  . Family history of ASCVD (arteriosclerotic cardiovascular disease) 12/31/2016  . Family history of premature CAD 11/11/2016  . GAD (generalized anxiety disorder) 10/30/2015  .  GERD (gastroesophageal reflux disease) 05/29/2014  . Headache 01/01/2017  . HSV (herpes simplex virus) infection   . Hypertension   . Initiation of Depo Provera 09/24/2011  . Lung nodule    RLL on coronary calcium score CT  . Migraine headache     Past Surgical History:  Procedure Laterality Date  . BREAST CYST ASPIRATION Right 02/10/2018   neg  . CESAREAN SECTION      x 3   . COLPOSCOPY  2010  . DILATION AND CURETTAGE OF UTERUS    . FLEXIBLE BRONCHOSCOPY N/A 03/29/2018   Procedure: PREOP  BRONCHOSCOPY;  Surgeon: Nestor Lewandowsky, MD;  Location: [facility];  Service: Thoracic;  Laterality: N/A;  . HYSTEROSCOPY WITH D & C N/A 01/12/2020   Procedure: DILATATION AND CURETTAGE /HYSTEROSCOPY;  Surgeon: Homero Fellers, MD;  Location: [facility];  Service: Gynecology;  Laterality: N/A;  . THORACOTOMY Right 03/29/2018   Procedure: THORACOTOMY MAJOR- POSSIBLE LOBECTOMY;  Surgeon: Nestor Lewandowsky, MD;  Location: [facility];  Service: Thoracic;  Laterality: Right;  . TUBAL LIGATION    . VIDEO ASSISTED THORACOSCOPY (VATS)/WEDGE RESECTION Right 03/29/2018   Procedure: VIDEO ASSISTED THORACOSCOPY (VATS)/WEDGE RESECTION;  Surgeon: Nestor Lewandowsky, MD;  Location: [facility];  Service: Thoracic;  Laterality: Right;    Current Medications: Current Meds  Medication Sig  . ALPRAZolam (XANAX) 0.5 MG tablet Take 1 tablet (0.5 mg total) by mouth at bedtime as needed.  . Aspirin-Caffeine (BC FAST PAIN RELIEF PO) Take 1 packet by mouth daily as needed (headache).  . lisinopril (ZESTRIL) 10 MG tablet Take 1/2-1 qd  . Multiple Vitamin (MULTIVITAMIN WITH MINERALS) TABS tablet Take 2 tablets by mouth daily. Juice Plus fruit and vegetables  . omeprazole (PRILOSEC) 40 MG capsule Take 1 capsule (40 mg total) by mouth daily.  . sertraline (ZOLOFT) 100 MG tablet Take 1.5 tablets (150 mg total) by mouth daily.  . valACYclovir (VALTREX) 500 MG tablet TAKE 2 TABLETS BY MOUTH TODAY, THEN 2 TABLETS TWICE DAILY AS NEEDED.    Allergies:   Patient has no known allergies.   Social History   Socioeconomic History  . Marital status: Married    Spouse name: Francee Piccolo  . Number of children: 3  . Years of education: High school  . Highest education level: Not on file  Occupational History  . Not on file  Tobacco Use  . Smoking status: Former Smoker    Packs/day: 1.00    Years: 25.00    Pack years:  25.00    Types: Cigarettes    Quit date: 11/22/2015    Years since quitting: 4.8  . Smokeless tobacco: Never Used  Vaping Use  . Vaping Use: Never used  Substance and Sexual Activity  . Alcohol use: Yes    Comment: rare  . Drug use: No  . Sexual activity: Yes    Birth control/protection: Surgical    Comment: tubalization  Other Topics Concern  . 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      Family: not close with family that are living, but good relationship with children      Enjoys: binge watch TV, crystals - metaphysical studies      Exercise: not currently   Diet: pretty good, lunch - salads, home cooking most of the time      Safety  Seat belts: Yes    Guns: Yes  and secure   Safe in relationships: Yes    Social Determinants of Health   Financial Resource Strain: Not on file  Food Insecurity: Not on file  Transportation Needs: Not on file  Physical Activity: Not on file  Stress: Not on file  Social Connections: Not on file     Family History:  The patient's family history includes Breast cancer in her cousin; Breast cancer (age of onset: 32) in her maternal grandmother; Heart disease in her sister; Heart disease (age of onset: 71) in her brother; Heart disease (age of onset: 23) in her father; Hypertension in her father; Stroke (age of onset: 70) in her sister; Stroke (age of onset: 61) in her mother.  ROS:   Review of Systems  Constitutional: Negative for chills, diaphoresis, fever, malaise/fatigue and weight loss.  HENT: Negative for congestion.   Eyes: Negative for discharge and redness.  Respiratory: Negative for cough, sputum production, shortness of breath and wheezing.   Cardiovascular: Positive for palpitations. Negative for chest pain, orthopnea, claudication, leg swelling and PND.  Gastrointestinal: Negative for  abdominal pain, heartburn, nausea and vomiting.  Musculoskeletal: Negative for falls and myalgias.  Skin: Negative for rash.  Neurological: Negative for dizziness, tingling, tremors, sensory change, speech change, focal weakness, loss of consciousness and weakness.  Endo/Heme/Allergies: Does not bruise/bleed easily.  Psychiatric/Behavioral: Negative for substance abuse. The patient is not nervous/anxious.   All other systems reviewed and are negative.    EKGs/Labs/Other Studies Reviewed:    Studies reviewed were summarized above. The additional studies were reviewed today:  ETT 12/2016:  Good exercise capacity with normal heart rate and blood pressure response. No angina reported.  No significant ST-segment or T-wave abnormalities during stress or recovery; baseline EKG shows non-specific T-wave changes.  No significant arrhythmias.  Low risk exercise tolerance test ([facility] Treadmill Score = 9) __________  Calcium score 12/2016: IMPRESSION: Coronary calcium score of 0. See radiology note regarding f/u lung Nodule __________  2D echo 08/2019: 1. Left ventricular ejection fraction, by estimation, is 50 to 55%. The  left ventricle has low normal function. Left ventricular endocardial  border not optimally defined to evaluate regional wall motion. Left  ventricular diastolic parameters were  normal.  2. Right ventricular systolic function is normal. The right ventricular  size is normal.  3. The mitral valve is grossly normal. No evidence of mitral valve  regurgitation.  4. The aortic valve is normal in structure and function. Aortic valve  regurgitation is not visualized.  5. Pulmonic valve regurgitation not assessed.  6. The inferior vena cava is normal in size with greater than 50%  respiratory variability, suggesting right atrial pressure of 3 mmHg. __________  Elwyn Reach patch 07/2020:  The patient was monitored for 14 days.  The predominant rhythm was sinus with an  average rate of 84 bpm (range 54-154 bpm).  There were rare PACs and PVCs. Episodes of ventricular bigeminy and trigeminy were noted.  There was no sustained arrhythmia or prolonged pause.  Patient triggered events correspond to sinus rhythm with PVCs.   Predominately sinus rhythm with rare PACs and PVCs.    EKG:  EKG is not ordered today.    Recent Labs: 03/22/2020: ALT 12; BUN 11; Creatinine, Ser 0.90; Hemoglobin 13.0; Platelets 269.0; Potassium 4.2; Sodium 139; TSH 1.45  Recent Lipid Panel    Component Value Date/Time   CHOL 157 03/22/2020 1438   TRIG 118.0 03/22/2020 1438  HDL 32.50 (L) 03/22/2020 1438   CHOLHDL 5 03/22/2020 1438   VLDL 23.6 03/22/2020 1438   LDLCALC 101 (H) 03/22/2020 1438   LDLCALC 129 (H) 02/14/2019 1352    PHYSICAL EXAM:    VS:  BP 110/70 (BP Location: Left Arm, Patient Position: Sitting, Cuff Size: Normal)   Pulse 77   Ht _0  (1.626 m)   Wt 163 lb 8 oz (74.2 kg)   LMP 09/13/2020   SpO2 99%   BMI 28.06 kg/m   BMI: Body mass index is 28.06 kg/m.  Physical Exam Vitals reviewed.  Constitutional:      Appearance: She is well-developed.  HENT:     Head: Normocephalic and atraumatic.  Eyes:     General:        Right eye: No discharge.        Left eye: No discharge.  Neck:     Vascular: No JVD.  Cardiovascular:     Rate and Rhythm: Normal rate and regular rhythm.     Pulses: No midsystolic click and no opening snap.          Dorsalis pedis pulses are 2+ on the right side and 2+ on the left side.       Posterior tibial pulses are 2+ on the right side and 2+ on the left side.     Heart sounds: Normal heart sounds, S1 normal and S2 normal. Heart sounds not distant. No murmur heard. No friction rub.  Pulmonary:     Effort: Pulmonary effort is normal. No respiratory distress.     Breath sounds: Normal breath sounds. No decreased breath sounds, wheezing or rales.  Chest:     Chest wall: No tenderness.  Abdominal:     General: There is no  distension.     Palpations: Abdomen is soft.     Tenderness: There is no abdominal tenderness.  Musculoskeletal:     Cervical back: Normal range of motion.  Skin:    General: Skin is warm and dry.     Nails: There is no clubbing.  Neurological:     Mental Status: She is alert and oriented to person, place, and time.  Psychiatric:        Speech: Speech normal.        Behavior: Behavior normal.        Thought Content: Thought content normal.        Judgment: Judgment normal.     Wt Readings from Last 3 Encounters:  10/12/20 163 lb 8 oz (74.2 kg)  08/16/20 162 lb (73.5 kg)  07/31/20 160 lb (72.6 kg)     ASSESSMENT & PLAN:   1. Exertional dyspnea/chest pain: Not currently active.  Cardiac work-up including ETT, calcium score, and echo have been unrevealing.  Prior chest discomfort has been felt to be atypical related to prior right-sided thoracotomy site.  Continue to encourage weight loss and increase activity level.  2. HTN: Blood pressure is well controlled in the office today.  She remains on lisinopril.  3. Palpitations/PVCs: Palpitations continue to be symptomatic though PVC burden is rare.  She will let us know if symptoms become frequent enough that she would want symptomatic treatment.  If symptoms do become more frequent we could consider transitioning her lisinopril to metoprolol or carvedilol in an effort to manage blood pressure and symptomatic palpitation burden.  Limit caffeine use.  No further work-up indicated at this time.  4. OSA: Noted on recent sleep study.  She has  been referred to ENT by pulmonology for consideration of oral appliance to assist in management of this.  Disposition: F/u with Dr. Saunders Revel or an APP in 12 months, sooner if needed.   Medication Adjustments/Labs and Tests Ordered: Current medicines are reviewed at length with the patient today.  Concerns regarding medicines are outlined above. Medication changes, Labs and Tests ordered today are  summarized above and listed in the Patient Instructions accessible in Encounters.   Signed, Christell Faith, PA-C 10/12/2020 4:03 PM     [address] [place], [place] [address] [phone]

## 2020-10-12 ENCOUNTER — Encounter: Payer: Self-pay | Admitting: Physician Assistant

## 2020-10-12 ENCOUNTER — Ambulatory Visit
Admission: RE | Admit: 2020-10-12 | Discharge: 2020-10-12 | Disposition: A | Discharge status: Home / Self Care | Payer: BC Managed Care – PPO | Source: Ambulatory Visit | Attending: Family Medicine | Admitting: Family Medicine

## 2020-10-12 ENCOUNTER — Other Ambulatory Visit: Payer: Self-pay

## 2020-10-12 ENCOUNTER — Ambulatory Visit (INDEPENDENT_AMBULATORY_CARE_PROVIDER_SITE_OTHER): Payer: BC Managed Care – PPO | Admitting: Physician Assistant

## 2020-10-12 VITALS — BP 110/70 | HR 77 | Ht 64.0 in | Wt 163.5 lb

## 2020-10-12 DIAGNOSIS — R002 Palpitations: Secondary | ICD-10-CM | POA: Diagnosis not present

## 2020-10-12 DIAGNOSIS — R0789 Other chest pain: Secondary | ICD-10-CM | POA: Diagnosis not present

## 2020-10-12 DIAGNOSIS — I493 Ventricular premature depolarization: Secondary | ICD-10-CM

## 2020-10-12 DIAGNOSIS — R928 Other abnormal and inconclusive findings on diagnostic imaging of breast: Secondary | ICD-10-CM | POA: Diagnosis not present

## 2020-10-12 DIAGNOSIS — R922 Inconclusive mammogram: Secondary | ICD-10-CM | POA: Diagnosis not present

## 2020-10-12 DIAGNOSIS — N6320 Unspecified lump in the left breast, unspecified quadrant: Secondary | ICD-10-CM | POA: Insufficient documentation

## 2020-10-12 DIAGNOSIS — I10 Essential (primary) hypertension: Secondary | ICD-10-CM

## 2020-10-12 DIAGNOSIS — N6012 Diffuse cystic mastopathy of left breast: Secondary | ICD-10-CM | POA: Diagnosis not present

## 2020-10-12 DIAGNOSIS — G4733 Obstructive sleep apnea (adult) (pediatric): Secondary | ICD-10-CM

## 2020-10-12 NOTE — Patient Instructions (Signed)
Medication Instructions:  - Your physician recommends that you continue on your current medications as directed. Please refer to the Current Medication list given to you today.  *If you need a refill on your cardiac medications before your next appointment, please call your pharmacy*   Lab Work: - none ordered  If you have labs (blood work) drawn today and your tests are completely normal, you will receive your results only by: Marland Kitchen MyChart Message (if you have MyChart) OR . A paper copy in the mail If you have any lab test that is abnormal or we need to change your treatment, we will call you to review the results.   Testing/Procedures: - none ordered   Follow-Up: At [facility], you and your health needs are our priority.  As part of our continuing mission to provide you with exceptional heart care, we have created designated Provider Care Teams.  These Care Teams include your primary Cardiologist (physician) and Advanced Practice Providers (APPs -  Physician Assistants and Nurse Practitioners) who all work together to provide you with the care you need, when you need it.  We recommend signing up for the patient portal called "MyChart".  Sign up information is provided on this After Visit Summary.  MyChart is used to connect with patients for Virtual Visits (Telemedicine).  Patients are able to view lab/test results, encounter notes, upcoming appointments, etc.  Non-urgent messages can be sent to your provider as well.   To learn more about what you can do with MyChart, go to [email or web address].    Your next appointment:   1 year(s)  The format for your next appointment:   In Person  Provider:   You may see Nelva Bush, MD or one of the following Advanced Practice Providers on your designated Care Team:    Murray Hodgkins, NP  Christell Faith, PA-C  Marrianne Mood, PA-C  [place], [place]  Laurann Montana, NP    Other Instructions - n/a

## 2020-12-03 DIAGNOSIS — G4733 Obstructive sleep apnea (adult) (pediatric): Secondary | ICD-10-CM | POA: Diagnosis not present

## 2020-12-25 ENCOUNTER — Telehealth: Payer: Self-pay | Admitting: Family Medicine

## 2020-12-25 NOTE — Telephone Encounter (Signed)
Please see message and advise 

## 2020-12-25 NOTE — Telephone Encounter (Signed)
Pt c/o nausea x 4-5 days, not able to eat since friday, slight headache. Pt states that she will vomit while brushing her teeth due to her gag reflex being very sensitive right now; outside of that no other vomiting spells. Denies abdominal pain. Pt states that she has not been able to have a BM since Friday. Last BM was very small.  Pt stopped vaping (delta 8) on Wed last week 12/19/20 Concerned of possible withdrawals from that  Patient also concerned with heart attack - no chest pain, arm pain Brother dies a few years back at young age of sudden heart attack - similar symptoms.   Please advise given her symptoms (N/V), thanks.   We have no openings here today unless someone can work her in.

## 2020-12-25 NOTE — Telephone Encounter (Signed)
It looks like she follows with cardiology, last visit was in April 2022, prior cardiac test "unrevealing" per cardiology at this visit. This is very reassuring.   Agree that she needs evaluation, can we get her in tomorrow or later in the week with someone? She needs to work on hydration with water to prevent kidney damage. If she's worse and has not been able to hydrate with water then needs ED evaluation.

## 2020-12-25 NOTE — Telephone Encounter (Signed)
Patient scheduled with Dr Diona Browner Thursday Dec 27, 2020  Arrive by 9:05 AMAppt at 9:20 AM (20 min)   Aware of ED precautions - will send to Dr Diona Browner to make aware of upcoming appt.   Nothing further needed.

## 2020-12-27 ENCOUNTER — Ambulatory Visit: Payer: BC Managed Care – PPO | Admitting: Family Medicine

## 2020-12-27 ENCOUNTER — Other Ambulatory Visit: Payer: Self-pay

## 2020-12-27 VITALS — BP 118/70 | HR 90 | Temp 98.2°F | Resp 16 | Ht 64.0 in | Wt 160.0 lb

## 2020-12-27 DIAGNOSIS — R519 Headache, unspecified: Secondary | ICD-10-CM

## 2020-12-27 DIAGNOSIS — R110 Nausea: Secondary | ICD-10-CM | POA: Diagnosis not present

## 2020-12-27 DIAGNOSIS — R0789 Other chest pain: Secondary | ICD-10-CM | POA: Insufficient documentation

## 2020-12-27 NOTE — Progress Notes (Signed)
Patient ID: Catherine Gilbert, female    DOB: [birth year], 45 y.o.   MRN: [id number]  This visit was conducted in person.  BP 118/70   Pulse 90   Temp 98.2 F (36.8 C)   Resp 16   Ht 5\' 4"  (1.626 m)   Wt 160 lb (72.6 kg)   SpO2 98%   BMI 27.46 kg/m    CC: Chief Complaint  Patient presents with   Nausea    Subjective:   HPI: Catherine Gilbert is a 45 y.o. female  former smoker of Dr. Verda Cumins with history of  right lung cancer s/p wedge resection, HTN, GAD, GERD, presenting on 12/27/2020 for Nausea    She reports new onset nausea/indigestion in last 4-5 days.  She has been able to drink liquids.  No emesis.Marland Kitchen one episode of diarrhea 2 days ago after eating.  Mild headache and feeling weak. No heartburn or sour taste in throat... she is on daily omeprazole 40 mg daily. No fever. No abdominal pain or cramping. She was having some chest pressure ( in upper chest mainly around throat) with  and without exertion, has chronic mild SOB since lung resection,no change.  She does have anxiety and felt panicky when feeling ill given brother's history. Prior to felling ill she had described anxiety as well controlled on sertraline.   No sick contacts,  No new meds.  Brother died 26 years ago age 26 with MI. She is anxious about this because he presented with indigestion. Had stress test2-3 years ago.. low risk.   She started vaping delta 8 in last 6 months, has increased dose... now in last week she has quit. Has also stopped energy drinks in last week.   Today she is feeling much better.. mildly weak, and tired but no longer nausea.  The further she gets from quitting vaping.. the better she feels.    Reviewed last stress test/ECHO from 2018    Normal exercise   Relevant past medical, surgical, family and social history reviewed and updated as indicated. Interim medical history since our last visit reviewed. Allergies and medications reviewed and updated. Outpatient Medications Prior  to Visit  Medication Sig Dispense Refill   ALPRAZolam (XANAX) 0.5 MG tablet Take 1 tablet (0.5 mg total) by mouth at bedtime as needed. 30 tablet 5   Aspirin-Caffeine (BC FAST PAIN RELIEF PO) Take 1 packet by mouth daily as needed (headache).     lisinopril (ZESTRIL) 10 MG tablet Take 1/2-1 qd 90 tablet 3   Multiple Vitamin (MULTIVITAMIN WITH MINERALS) TABS tablet Take 2 tablets by mouth daily. Juice Plus fruit and vegetables     omeprazole (PRILOSEC) 40 MG capsule Take 1 capsule (40 mg total) by mouth daily. 90 capsule 3   sertraline (ZOLOFT) 100 MG tablet Take 1.5 tablets (150 mg total) by mouth daily. 135 tablet 1   valACYclovir (VALTREX) 500 MG tablet TAKE 2 TABLETS BY MOUTH TODAY, THEN 2 TABLETS TWICE DAILY AS NEEDED. 60 tablet 5   No facility-administered medications prior to visit.     Per HPI unless specifically indicated in ROS section below Review of Systems Objective:  BP 118/70   Pulse 90   Temp 98.2 F (36.8 C)   Resp 16   Ht 5\' 4"  (1.626 m)   Wt 160 lb (72.6 kg)   SpO2 98%   BMI 27.46 kg/m   Wt Readings from Last 3 Encounters:  12/27/20 160 lb (72.6 kg)  10/12/20 163 lb  8 oz (74.2 kg)  08/16/20 162 lb (73.5 kg)      Physical Exam    Results for orders placed or performed during the hospital encounter of 05/03/20  Group A Strep by PCR   Specimen: Throat; Sterile Swab  Result Value Ref Range   Group A Strep by PCR NOT DETECTED NOT DETECTED  SARS CORONAVIRUS 2 (TAT 6-24 HRS) Nasopharyngeal Nasopharyngeal Swab   Specimen: Nasopharyngeal Swab  Result Value Ref Range   SARS Coronavirus 2 NEGATIVE NEGATIVE    This visit occurred during the SARS-CoV-2 public health emergency.  Safety protocols were in place, including screening questions prior to the visit, additional usage of staff PPE, and extensive cleaning of exam room while observing appropriate contact time as indicated for disinfecting solutions.   COVID 19 screen:  No recent travel or known exposure to  COVID19 The patient denies respiratory symptoms of COVID 19 at this time. The importance of social distancing was discussed today.   Assessment and Plan EKG: normal EKG, normal sinus rhythm, unchanged from previous tracings.    Problem List Items Addressed This Visit     Acute nonintractable headache   Chest pressure     EKG unchanged and unremarkable, neg cardiac eval in 2018. Symptoms most likely caused by anxiety triggered by symptoms similar to what brother had prior to passing.   if worsening follow up with cardiology.       Relevant Orders   EKG 12-Lead (Completed)   Nausea - Primary    Appears to be related to stopping vaping with Delta 8 ( listed as common SE to stopping this) Symptoms have resolved further away from cessation.  Avoid acidic foods and continue PPI  As GERd flare may be ongoing.  no abd pain, so pancreatitis, gastritis, PUD gallbladder less likely.         Eliezer Lofts, MD

## 2020-12-27 NOTE — Assessment & Plan Note (Signed)
EKG unchanged and unremarkable, neg cardiac eval in 2018. Symptoms most likely caused by anxiety triggered by symptoms similar to what brother had prior to passing.   if worsening follow up with cardiology.

## 2020-12-27 NOTE — Assessment & Plan Note (Signed)
Appears to be related to stopping vaping with Delta 8 ( listed as common SE to stopping this) Symptoms have resolved further away from cessation.  Avoid acidic foods and continue PPI  As GERd flare may be ongoing.  no abd pain, so pancreatitis, gastritis, PUD gallbladder less likely.

## 2020-12-27 NOTE — Patient Instructions (Signed)
Keep up with water, remain off vaping and energy drinks.  Symptoms should improve with time.  Avoid acidic foods, alcohol, caffeine, citrus etc. Call if nausea/indigestion returns.

## 2021-03-01 ENCOUNTER — Other Ambulatory Visit: Payer: Self-pay | Admitting: Family Medicine

## 2021-03-20 ENCOUNTER — Other Ambulatory Visit: Payer: Self-pay

## 2021-03-20 ENCOUNTER — Ambulatory Visit: Payer: BC Managed Care – PPO | Admitting: Internal Medicine

## 2021-03-20 ENCOUNTER — Encounter: Payer: Self-pay | Admitting: Internal Medicine

## 2021-03-20 VITALS — BP 130/68 | HR 84 | Temp 98.1°F | Ht 64.0 in | Wt 153.8 lb

## 2021-03-20 DIAGNOSIS — G4733 Obstructive sleep apnea (adult) (pediatric): Secondary | ICD-10-CM | POA: Diagnosis not present

## 2021-03-20 DIAGNOSIS — R9389 Abnormal findings on diagnostic imaging of other specified body structures: Secondary | ICD-10-CM | POA: Diagnosis not present

## 2021-03-20 NOTE — Progress Notes (Signed)
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  HISTORY OF PRESENT ILLNESS:   Patient underwent lung infection and had curative surgery for primary lung cancer with adenocarcinoma Patient subsequently had follow-up assessment for sleep apnea Patient did not undergo traditional CPAP therapy however was referred to [facility] dentistry for oral device Follow-up sleep study is pending  At this time there is no significant issues at this time No significant respiratory issues  Patient follows up with oncology as surveillance Patient follows up with [facility]istry for repeat sleep study pending    PAST MEDICAL HISTORY :   has a past medical history of Abnormal Pap smear, Anemia, Anxiety, Anxiety state (04/23/2011), Cancer ([place]) (02/2018), Depression, Dysmenorrhea (08/27/2011), Dyspnea on exertion (10/30/2015), Essential hypertension (09/15/2012), Family history of ASCVD (arteriosclerotic cardiovascular disease) (12/31/2016), Family history of premature CAD (11/11/2016), GAD (generalized anxiety disorder) (10/30/2015), GERD (gastroesophageal reflux disease) (05/29/2014), Headache (01/01/2017), HSV (herpes simplex virus) infection, Hypertension, Initiation of Depo Provera (09/24/2011), Lung nodule, and Migraine headache.  has a past surgical history that includes Cesarean section; Tubal ligation; Dilation and curettage of uterus; Colposcopy (2010); Flexible bronchoscopy (N/A, 03/29/2018); Video assisted thoracoscopy (vats)/wedge  resection (Right, 03/29/2018); Thoracotomy (Right, 03/29/2018); Breast cyst aspiration (Right, 02/10/2018); and Hysteroscopy with D & C (N/A, 01/12/2020). Prior to Admission medications   Medication Sig Start Date End Date Taking? Authorizing Provider  ALPRAZolam Duanne Moron) 0.5 MG tablet Take 1 tablet (0.5 mg total) by mouth at bedtime as needed. 06/29/17  Yes Lucille Passy, MD  amoxicillin-clavulanate (AUGMENTIN) 875-125 MG tablet Take 1 tablet by mouth every 12 (twelve) hours. 05/26/17  Yes [provider]  FEROSUL 325 (65 Fe) MG tablet TAKE 1 TABLET(325 MG) BY MOUTH TWICE DAILY WITH A MEAL 03/24/17  Yes Lucille Passy, MD  lisinopril (PRINIVIL,ZESTRIL) 10 MG tablet TAKE 1 TABLET DAILY 11/25/16  Yes Lucille Passy, MD  omeprazole (PRILOSEC) 40 MG capsule Take 40 mg by mouth daily. 07/17/17  Yes [provider]  sertraline (ZOLOFT) 100 MG tablet TAKE 1 TABLET(100 MG) BY MOUTH DAILY 05/11/17  Yes Lucille Passy, MD  SUMAtriptan (IMITREX) 50 MG tablet Take 1 tablet (50 mg total) by mouth every 2 (two) hours as needed for migraine. May repeat in 2 hours if headache persists or recurs. 01/01/17  Yes Lucille Passy, MD  valACYclovir (VALTREX) 500 MG tablet TAKE 2 TABLETS BY MOUTH TODAY, THEN 2 TABLETS TWICE DAILY AS NEEDED. 06/29/17  Yes Lucille Passy, MD    Review of Systems:  Gen:  Denies  fever, sweats, chills weight loss  HEENT: Denies blurred vision, double vision, ear pain, eye pain, hearing loss, nose bleeds, sore throat Cardiac:  No dizziness, chest pain or heaviness, chest tightness,edema, No JVD Resp:   No cough, -sputum production, -shortness of breath,-wheezing, -hemoptysis,  Other:  All other systems negative   ALL OTHER ROS ARE NEGATIVE   BP 130/68 (BP Location: Left Arm, Patient Position: Sitting, Cuff Size: Normal)   Pulse 84   Temp 98.1 F (36.7 C) (  Oral)   Ht _0  (1.626 m)   Wt 153 lb 12.8 oz (69.8 kg)   SpO2 98%   BMI 26.40 kg/m   Physical Examination:    General Appearance: No distress  EYES PERRLA, EOM intact.   NECK Supple, No JVD Pulmonary: normal breath sounds, No wheezing.  ALL OTHER ROS ARE NEGATIVE     ASSESSMENT / PLAN: 45 year old pleasant white female seen for groundglass opacification several years ago found to have primary lung cancer with adenocarcinoma with underlying sleep apnea that is being treated with oral device   At this time no indication for CT chest as she is being surveillance by oncology  No indication for CPAP therapy as she is undergoing oral device therapy   Patient is to follow-up as needed   Patient  satisfied with Plan of action and management. All questions answered  TOTAL TIME SPENT 21 mins  Quinto Tippy Patricia Pesa, M.D.  [facility]  Medical Director [place] Director [facility] Cardio-Pulmonary Department

## 2021-03-20 NOTE — Patient Instructions (Signed)
Follow up as needed

## 2021-03-25 ENCOUNTER — Other Ambulatory Visit: Payer: Self-pay

## 2021-03-25 ENCOUNTER — Ambulatory Visit (INDEPENDENT_AMBULATORY_CARE_PROVIDER_SITE_OTHER): Payer: BC Managed Care – PPO | Admitting: Family Medicine

## 2021-03-25 ENCOUNTER — Encounter: Payer: Self-pay | Admitting: Family Medicine

## 2021-03-25 VITALS — BP 132/78 | HR 75 | Temp 97.0°F | Ht 64.75 in | Wt 154.0 lb

## 2021-03-25 DIAGNOSIS — F409 Phobic anxiety disorder, unspecified: Secondary | ICD-10-CM

## 2021-03-25 DIAGNOSIS — F5105 Insomnia due to other mental disorder: Secondary | ICD-10-CM

## 2021-03-25 DIAGNOSIS — Z0000 Encounter for general adult medical examination without abnormal findings: Secondary | ICD-10-CM

## 2021-03-25 DIAGNOSIS — Z8249 Family history of ischemic heart disease and other diseases of the circulatory system: Secondary | ICD-10-CM

## 2021-03-25 DIAGNOSIS — I10 Essential (primary) hypertension: Secondary | ICD-10-CM

## 2021-03-25 DIAGNOSIS — F411 Generalized anxiety disorder: Secondary | ICD-10-CM

## 2021-03-25 DIAGNOSIS — Z1211 Encounter for screening for malignant neoplasm of colon: Secondary | ICD-10-CM

## 2021-03-25 MED ORDER — SERTRALINE HCL 100 MG PO TABS: 150.0000 mg | ORAL_TABLET | Freq: Every day | ORAL | 3 refills | Status: DC

## 2021-03-25 MED ORDER — ALPRAZOLAM 0.5 MG PO TABS: 0.5000 mg | ORAL_TABLET | Freq: Every evening | ORAL | 3 refills | Status: DC | PRN

## 2021-03-25 NOTE — Patient Instructions (Addendum)
Lyn Henri, MD - [facility] [place], [place]  Hormone Therapy - Lyn Henri MD   Continue medications Reach out if needed

## 2021-03-25 NOTE — Progress Notes (Signed)
Annual Exam   Chief Complaint:  Chief Complaint  Patient presents with   Annual Exam    History of Present Illness:  [name] is a 45 y.o. [phone] who LMP was Patient's last menstrual period was 03/17/2021 (exact date)., presents today for her annual examination.     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   Social History   Tobacco Use  Smoking Status Former   Packs/day: 1.00   Years: 25.00   Pack years: 25.00   Types: Cigarettes   Quit date: 11/22/2015   Years since quitting: 5.3  Smokeless Tobacco Never   Social History   Substance and Sexual Activity  Alcohol Use Yes   Comment: rare   Social History   Substance and Sexual Activity  Drug Use No    Safety The patient wears seatbelts: yes.     The patient feels safe at home and in their relationships: yes.  General Health Dentist in the last year: Yes Eye doctor: yes  Menstrual Her menses are starting to be irregular Mood swings and irritability - but more towards periods  GYN She is single partner, contraception - none.    Cervical Cancer Screening:   Last Pap:   May 2019 Results were: no abnormalities /neg HPV DNA   Breast Cancer Screening There is no FH of breast cancer. There is no FH of ovarian cancer. BRCA screening Not Indicated.  Discussed that for average risk women between age 54-49 screening may reduce the risk of breast cancer death, however, at a lower rate than those over age 49. And that the the false-positive rates resulting in unnecessary biopsies with more screening is higher. The balance of benefits vs harms likely improves as you progress through your 40s. The patient does want a mammogram this year.   Colon Cancer Screening:  Age 64-75 yo - benefits outweigh the risk. Adults 72-85 yo who have never been screened benefit.  Benefits: 134000 people in  2016 will be diagnosed and 49,000 will die - early detection helps Harms: Complications 2/2 to colonoscopy High Risk (Colonoscopy): genetic disorder (Lynch syndrome or familial adenomatous polyposis), personal hx of IBD, previous adenomatous polyp, or previous colorectal cancer, FamHx start 10 years before the age at diagnosis, increased in males and black race  Options:  FIT - looks for hemoglobin (blood in the stool) - specific and fairly sensitive - must be done annually Cologuard - looks for DNA and blood - more sensitive - therefore can have more false positives, every 3 years Colonoscopy - every 10 years if normal - sedation, bowl prep, must have someone drive you  Shared decision making and the patient had decided to do colonoscopy.  Weight Wt Readings from Last 3 Encounters:  03/25/21 154 lb (69.9 kg)  03/20/21 153 lb 12.8 oz (69.8 kg)  12/27/20 160 lb (72.6 kg)   Patient has normal BMI  BMI Readings from Last 1 Encounters:  03/25/21 25.83 kg/m     Chronic disease screening Blood pressure monitoring:  BP Readings from Last 3 Encounters:  03/25/21 132/78  03/20/21 130/68  12/27/20 118/70    Lipid Monitoring: Indication for screening: age >50, obesity, diabetes, family hx, CV risk factors.  Lipid screening: Yes  Lab Results  Component Value Date   CHOL 157 03/22/2020   HDL 32.50 (L) 03/22/2020   LDLCALC 101 (H)  03/22/2020   TRIG 118.0 03/22/2020   CHOLHDL 5 03/22/2020     Diabetes Screening: age >27, overweight, family hx, PCOS, hx of gestational diabetes, at risk ethnicity Diabetes Screening screening: Yes  No results found for: HGBA1C   Past Medical History:  Diagnosis Date   Abnormal Pap smear    Anemia    Anxiety    Anxiety state 04/23/2011   Cancer ([place]) 02/2018   lung rt   Depression    Dysmenorrhea 08/27/2011   Dyspnea on exertion 10/30/2015   Essential hypertension 09/15/2012   Family history of ASCVD (arteriosclerotic cardiovascular disease)  12/31/2016   Family history of premature CAD 11/11/2016   GAD (generalized anxiety disorder) 10/30/2015   GERD (gastroesophageal reflux disease) 05/29/2014   Headache 01/01/2017   HSV (herpes simplex virus) infection    Hypertension    Initiation of Depo Provera 09/24/2011   Lung nodule    RLL on coronary calcium score CT   Migraine headache     Past Surgical History:  Procedure Laterality Date   BREAST CYST ASPIRATION Right 02/10/2018   neg   CESAREAN SECTION     x 3    COLPOSCOPY  2010   DILATION AND CURETTAGE OF UTERUS     FLEXIBLE BRONCHOSCOPY N/A 03/29/2018   Procedure: PREOP  BRONCHOSCOPY;  Surgeon: Nestor Lewandowsky, MD;  Location: [facility] ORS;  Service: Thoracic;  Laterality: N/A;   HYSTEROSCOPY WITH D & C N/A 01/12/2020   Procedure: DILATATION AND CURETTAGE /HYSTEROSCOPY;  Surgeon: Homero Fellers, MD;  Location: [facility] ORS;  Service: Gynecology;  Laterality: N/A;   THORACOTOMY Right 03/29/2018   Procedure: THORACOTOMY MAJOR- POSSIBLE LOBECTOMY;  Surgeon: Nestor Lewandowsky, MD;  Location: [facility] ORS;  Service: Thoracic;  Laterality: Right;   TUBAL LIGATION     VIDEO ASSISTED THORACOSCOPY (VATS)/WEDGE RESECTION Right 03/29/2018   Procedure: VIDEO ASSISTED THORACOSCOPY (VATS)/WEDGE RESECTION;  Surgeon: Nestor Lewandowsky, MD;  Location: [facility] ORS;  Service: Thoracic;  Laterality: Right;    Prior to Admission medications   Medication Sig Start Date End Date Taking? Authorizing Provider  ALPRAZolam Duanne Moron) 0.5 MG tablet Take 1 tablet (0.5 mg total) by mouth at bedtime as needed. 03/22/20  Yes Lesleigh Noe, MD  Aspirin-Caffeine (BC FAST PAIN RELIEF PO) Take 1 packet by mouth daily as needed (headache).   Yes [provider]  lisinopril (ZESTRIL) 10 MG tablet Take 1/2-1 qd 09/20/20  Yes Lesleigh Noe, MD  Multiple Vitamin (MULTIVITAMIN WITH MINERALS) TABS tablet Take 2 tablets by mouth daily. Juice Plus fruit and vegetables   Yes [provider]  omeprazole (PRILOSEC) 40 MG capsule  Take 1 capsule (40 mg total) by mouth daily. 09/20/20  Yes Lesleigh Noe, MD  sertraline (ZOLOFT) 100 MG tablet TAKE 1 AND 1/2 TABLETS BY  MOUTH DAILY 03/01/21  Yes Lesleigh Noe, MD  valACYclovir (VALTREX) 500 MG tablet TAKE 2 TABLETS BY MOUTH TODAY, THEN 2 TABLETS TWICE DAILY AS NEEDED. 08/01/19  Yes Lucille Passy, MD    No Known Allergies  Gynecologic History: Patient's last menstrual period was 03/17/2021 (exact date).  Obstetric History: [id number]  Social History   Socioeconomic History   Marital status: Married    Spouse name: Francee Piccolo   Number of children: 3   Years of education: High school   Highest education level: Not on file  Occupational History   Not on file  Tobacco Use   Smoking status: Former    Packs/day: 1.00    Years:  25.00    Pack years: 25.00    Types: Cigarettes    Quit date: 11/22/2015    Years since quitting: 5.3   Smokeless tobacco: Never  Vaping Use   Vaping Use: Never used  Substance and Sexual Activity   Alcohol use: Yes    Comment: rare   Drug use: No   Sexual activity: Yes    Birth control/protection: Surgical    Comment: tubalization  Other Topics Concern   Not on file  Social History Narrative   10/13/19   From: the area   Living: Husband, Francee Piccolo (2000) and 3 children   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      Family: not close with family that are living, but good relationship with children      Enjoys: binge watch TV, crystals - metaphysical studies      Exercise: not currently   Diet: pretty good, lunch - salads, home cooking most of the time      Safety   Seat belts: Yes    Guns: Yes  and secure   Safe in relationships: Yes    Social Determinants of Health   Financial Resource Strain: Not on file  Food Insecurity: Not on file  Transportation Needs: Not on file  Physical Activity: Not on file  Stress: Not on file  Social Connections: Not on file  Intimate Partner Violence: Not on file    Family  History  Problem Relation Age of Onset   Stroke Mother 70       massive stroke   Heart disease Father 27       congestive heart failure   Hypertension Father    Heart disease Brother 81       Suspected MI and SCD   Heart disease Sister    Stroke Sister 34   Breast cancer Maternal Grandmother 62   Breast cancer Cousin        x2 paternal, both alive    Review of Systems  Constitutional:  Negative for chills and fever.  HENT:  Negative for congestion and sore throat.   Eyes:  Negative for blurred vision and double vision.  Respiratory:  Negative for shortness of breath.   Cardiovascular:  Negative for chest pain.  Gastrointestinal:  Negative for heartburn, nausea and vomiting.  Genitourinary: Negative.   Musculoskeletal: Negative.  Negative for myalgias.  Skin:  Negative for rash.  Neurological:  Negative for dizziness and headaches.  Endo/Heme/Allergies:  Does not bruise/bleed easily.  Psychiatric/Behavioral:  Negative for depression. The patient is not nervous/anxious.     Physical Exam BP 132/78   Pulse 75   Temp (!) 97 F (36.1 C) (Temporal)   Ht 5' 4.75" (1.645 m)   Wt 154 lb (69.9 kg)   LMP 03/17/2021 (Exact Date)   SpO2 97%   BMI 25.83 kg/m    BP Readings from Last 3 Encounters:  03/25/21 132/78  03/20/21 130/68  12/27/20 118/70      Physical Exam Constitutional:      General: She is not in acute distress.    Appearance: She is well-developed. She is not diaphoretic.  HENT:     Head: Normocephalic and atraumatic.     Right Ear: External ear normal.     Left Ear: External ear normal.     Nose: Nose normal.  Eyes:     General: No scleral icterus.    Extraocular Movements: Extraocular movements intact.     Conjunctiva/sclera: Conjunctivae  normal.  Cardiovascular:     Rate and Rhythm: Normal rate and regular rhythm.     Heart sounds: No murmur heard. Pulmonary:     Effort: Pulmonary effort is normal. No respiratory distress.     Breath sounds:  Normal breath sounds. No wheezing.  Abdominal:     General: Bowel sounds are normal. There is no distension.     Palpations: Abdomen is soft. There is no mass.     Tenderness: There is no abdominal tenderness. There is no guarding or rebound.  Musculoskeletal:        General: Normal range of motion.     Cervical back: Neck supple.  Lymphadenopathy:     Cervical: No cervical adenopathy.  Skin:    General: Skin is warm and dry.     Capillary Refill: Capillary refill takes less than 2 seconds.  Neurological:     Mental Status: She is alert and oriented to person, place, and time.     Deep Tendon Reflexes: Reflexes normal.  Psychiatric:        Mood and Affect: Mood normal.        Behavior: Behavior normal.     Results:  PHQ-9:  [place] Office Visit from 03/25/2021 in [place] [facility] [place]  PHQ-9 Total Score 6         Assessment: 45 y.o. [phone] female here for routine annual physical examination.  Plan: Problem List Items Addressed This Visit       Cardiovascular and Mediastinum   Essential hypertension   Relevant Orders   Comprehensive metabolic panel   CBC   TSH     Other   GAD (generalized anxiety disorder) - Primary   Relevant Medications   sertraline (ZOLOFT) 100 MG tablet   ALPRAZolam (XANAX) 0.5 MG tablet   Family history of premature CAD   Relevant Orders   Lipid panel   Other Visit Diagnoses     Insomnia due to anxiety and fear       Relevant Medications   ALPRAZolam (XANAX) 0.5 MG tablet   Annual physical exam       Screening for colon cancer       Relevant Orders   Ambulatory referral to Gastroenterology       Screening: -- Blood pressure screen normal -- cholesterol screening: will obtain -- Weight screening: normal -- Diabetes Screening: will obtain -- Nutrition: Encouraged healthy diet  The 10-year ASCVD risk score (Arnett DK, et al., 2019) is: 1.8%   Values used to calculate the score:     Age: 8 years      Sex: Female     Is Non-Hispanic African American: No     Diabetic: No     Tobacco smoker: No     Systolic Blood Pressure: 478 mmHg     Is BP treated: Yes     HDL Cholesterol: 32.5 mg/dL     Total Cholesterol: 157 mg/dL  -- Statin therapy for Age 66-75 with CVD risk >7.5%  Psych -- Depression screening (PHQ-9):  [place] Visit from 03/25/2021 in [place] at [place]  PHQ-9 Total Score 6        Safety -- tobacco screening: not using -- alcohol screening:  low-risk usage. -- no evidence of domestic violence or intimate partner violence.   Cancer Screening -- pap smear not collected per ASCCP guidelines -- family history of breast cancer screening: done. not at high risk. -- Mammogram -  up to date --  Colon cancer (age 22+)--  referral placed  Immunizations Immunization History  Administered Date(s) Administered   Influenza-Unspecified 08/26/2017, 04/03/2018   Moderna Sars-Covid-2 Vaccination 02/29/2020, 03/29/2020   Tdap 11/17/2017    -- flu vaccine not up to date - declined -- TDAP q10 years up to date -- Covid-19 Vaccine up to date   Encouraged healthy diet and exercise. Encouraged regular vision and dental care.   Lesleigh Noe, MD

## 2021-03-26 ENCOUNTER — Other Ambulatory Visit: Payer: Self-pay

## 2021-03-26 ENCOUNTER — Encounter: Payer: Self-pay | Admitting: Family Medicine

## 2021-03-26 DIAGNOSIS — Z1211 Encounter for screening for malignant neoplasm of colon: Secondary | ICD-10-CM

## 2021-03-26 LAB — COMPREHENSIVE METABOLIC PANEL
ALT: 11 U/L (ref 0–35)
AST: 13 U/L (ref 0–37)
Albumin: 4.4 g/dL (ref 3.5–5.2)
Alkaline Phosphatase: 38 U/L — ABNORMAL LOW (ref 39–117)
BUN: 13 mg/dL (ref 6–23)
CO2: 26 mEq/L (ref 19–32)
Calcium: 9.1 mg/dL (ref 8.4–10.5)
Chloride: 102 mEq/L (ref 96–112)
Creatinine, Ser: 0.88 mg/dL (ref 0.40–1.20)
GFR: 79.1 mL/min (ref >60.00)
Glucose, Bld: 100 mg/dL — ABNORMAL HIGH (ref 70–99)
Potassium: 3.5 mEq/L (ref 3.5–5.1)
Sodium: 138 mEq/L (ref 135–145)
Total Bilirubin: 0.3 mg/dL (ref 0.2–1.2)
Total Protein: 6.6 g/dL (ref 6.0–8.3)

## 2021-03-26 LAB — LIPID PANEL
Cholesterol: 185 mg/dL (ref 0–200)
HDL: 39.3 mg/dL (ref >39.00)
NonHDL: 145.86
Total CHOL/HDL Ratio: 5
Triglycerides: 238 mg/dL — ABNORMAL HIGH (ref 0.0–149.0)
VLDL: 47.6 mg/dL — ABNORMAL HIGH (ref 0.0–40.0)

## 2021-03-26 LAB — CBC
HCT: 34.1 % — ABNORMAL LOW (ref 36.0–46.0)
Hemoglobin: 11.8 g/dL — ABNORMAL LOW (ref 12.0–15.0)
MCHC: 34.7 g/dL (ref 30.0–36.0)
MCV: 88.7 fl (ref 78.0–100.0)
Platelets: 258 10*3/uL (ref 150.0–400.0)
RBC: 3.84 Mil/uL — ABNORMAL LOW (ref 3.87–5.11)
RDW: 13.8 % (ref 11.5–15.5)
WBC: 10.2 10*3/uL (ref 4.0–10.5)

## 2021-03-26 LAB — TSH: TSH: 1.35 u[IU]/mL (ref 0.35–5.50)

## 2021-03-26 LAB — LDL CHOLESTEROL, DIRECT: Direct LDL: 117 mg/dL

## 2021-03-26 MED ORDER — SUTAB 1479-225-188 MG PO TABS: 12.0000 | ORAL_TABLET | Freq: Once | ORAL | 0 refills | Status: AC

## 2021-03-26 NOTE — Progress Notes (Signed)
Gastroenterology Pre-Procedure Review  Request Date: 04/25/21 Requesting Physician: Dr. Marius Ditch  PATIENT REVIEW QUESTIONS: The patient responded to the following health history questions as indicated:    1. Are you having any GI issues? no 2. Do you have a personal history of Polyps? no 3. Do you have a family history of Colon Cancer or Polyps? no 4. Diabetes Mellitus? no 5. Joint replacements in the past 12 months?no 6. Major health problems in the past 3 months?no 7. Any artificial heart valves, MVP, or defibrillator?no    MEDICATIONS & ALLERGIES:    Patient reports the following regarding taking any anticoagulation/antiplatelet therapy:   Plavix, Coumadin, Eliquis, Xarelto, Lovenox, Pradaxa, Brilinta, or Effient? no Aspirin? no  Patient confirms/reports the following medications:  Current Outpatient Medications  Medication Sig Dispense Refill   Sodium Sulfate-Mag Sulfate-KCl (SUTAB) [phone] MG TABS Take 12 tablets by mouth once for 1 dose. 24 tablet 0   ALPRAZolam (XANAX) 0.5 MG tablet Take 1 tablet (0.5 mg total) by mouth at bedtime as needed. 30 tablet 3   Aspirin-Caffeine (BC FAST PAIN RELIEF PO) Take 1 packet by mouth daily as needed (headache).     lisinopril (ZESTRIL) 10 MG tablet Take 1/2-1 qd 90 tablet 3   Multiple Vitamin (MULTIVITAMIN WITH MINERALS) TABS tablet Take 2 tablets by mouth daily. Juice Plus fruit and vegetables     omeprazole (PRILOSEC) 40 MG capsule Take 1 capsule (40 mg total) by mouth daily. 90 capsule 3   sertraline (ZOLOFT) 100 MG tablet Take 1.5 tablets (150 mg total) by mouth daily. 135 tablet 3   valACYclovir (VALTREX) 500 MG tablet TAKE 2 TABLETS BY MOUTH TODAY, THEN 2 TABLETS TWICE DAILY AS NEEDED. 60 tablet 5   No current facility-administered medications for this visit.    Patient confirms/reports the following allergies:  No Known Allergies  No orders of the defined types were placed in this encounter.   AUTHORIZATION  INFORMATION Primary Insurance: 1D#: Group #:  Secondary Insurance: 1D#: Group #:  SCHEDULE INFORMATION: Date: 04/25/21 Time: Location: [place]

## 2021-04-02 ENCOUNTER — Telehealth: Payer: Self-pay

## 2021-04-02 NOTE — Telephone Encounter (Signed)
Pt. Calling to see if she can get the information again for a coupon. She said she didn't write it down and she forgot. Requesting a call back

## 2021-04-03 NOTE — Telephone Encounter (Signed)
Returned patients call. Left message informing patient coupon was mailed to her. If she needs one sooner she may stop by the office.

## 2021-04-08 ENCOUNTER — Encounter: Payer: Self-pay | Admitting: Gastroenterology

## 2021-04-10 ENCOUNTER — Ambulatory Visit
Admission: RE | Admit: 2021-04-10 | Discharge: 2021-04-10 | Disposition: A | Discharge status: Home / Self Care | Payer: BC Managed Care – PPO | Source: Ambulatory Visit | Attending: Internal Medicine | Admitting: Internal Medicine

## 2021-04-10 ENCOUNTER — Other Ambulatory Visit: Payer: Self-pay

## 2021-04-10 DIAGNOSIS — C3490 Malignant neoplasm of unspecified part of unspecified bronchus or lung: Secondary | ICD-10-CM | POA: Diagnosis not present

## 2021-04-10 DIAGNOSIS — C3431 Malignant neoplasm of lower lobe, right bronchus or lung: Secondary | ICD-10-CM | POA: Diagnosis not present

## 2021-04-10 MED ORDER — IOHEXOL 300 MG/ML  SOLN
75.0000 mL | Freq: Once | INTRAMUSCULAR | Status: AC | PRN
  Administered 2021-04-10: 75 mL via INTRAVENOUS

## 2021-04-25 ENCOUNTER — Telehealth: Payer: Self-pay

## 2021-04-25 ENCOUNTER — Ambulatory Visit: Payer: BC Managed Care – PPO | Admitting: Anesthesiology

## 2021-04-25 ENCOUNTER — Encounter: Payer: Self-pay | Admitting: Gastroenterology

## 2021-04-25 ENCOUNTER — Ambulatory Visit
Admission: RE | Admit: 2021-04-25 | Discharge: 2021-04-25 | Disposition: A | Discharge status: Home / Self Care | Payer: BC Managed Care – PPO | Source: Ambulatory Visit | Attending: Gastroenterology | Admitting: Gastroenterology

## 2021-04-25 ENCOUNTER — Other Ambulatory Visit: Payer: Self-pay

## 2021-04-25 ENCOUNTER — Encounter
Admission: RE | Disposition: A | Discharge status: Home / Self Care | Payer: Self-pay | Source: Ambulatory Visit | Attending: Gastroenterology

## 2021-04-25 DIAGNOSIS — Z87891 Personal history of nicotine dependence: Secondary | ICD-10-CM | POA: Diagnosis not present

## 2021-04-25 DIAGNOSIS — K635 Polyp of colon: Secondary | ICD-10-CM | POA: Diagnosis not present

## 2021-04-25 DIAGNOSIS — Z1211 Encounter for screening for malignant neoplasm of colon: Secondary | ICD-10-CM | POA: Insufficient documentation

## 2021-04-25 DIAGNOSIS — Z79899 Other long term (current) drug therapy: Secondary | ICD-10-CM | POA: Diagnosis not present

## 2021-04-25 DIAGNOSIS — Z7982 Long term (current) use of aspirin: Secondary | ICD-10-CM | POA: Diagnosis not present

## 2021-04-25 HISTORY — PX: POLYPECTOMY: SHX5525

## 2021-04-25 HISTORY — PX: COLONOSCOPY WITH PROPOFOL: SHX5780

## 2021-04-25 LAB — POCT PREGNANCY, URINE: Preg Test, Ur: NEGATIVE

## 2021-04-25 SURGERY — COLONOSCOPY WITH PROPOFOL
Anesthesia: General

## 2021-04-25 MED ORDER — PROPOFOL 10 MG/ML IV BOLUS
INTRAVENOUS | Status: DC | PRN
  Administered 2021-04-25: 50 mg via INTRAVENOUS
  Administered 2021-04-25: 20 mg via INTRAVENOUS
  Administered 2021-04-25: 40 mg via INTRAVENOUS
  Administered 2021-04-25: 150 mg via INTRAVENOUS
  Administered 2021-04-25: 20 mg via INTRAVENOUS
  Administered 2021-04-25 (×2): 30 mg via INTRAVENOUS

## 2021-04-25 MED ORDER — ACETAMINOPHEN 160 MG/5ML PO SOLN: 325.0000 mg | Freq: Once | ORAL | Status: DC

## 2021-04-25 MED ORDER — ACETAMINOPHEN 325 MG PO TABS: 325.0000 mg | ORAL_TABLET | Freq: Once | ORAL | Status: DC

## 2021-04-25 MED ORDER — SODIUM CHLORIDE 0.9 % IV SOLN: INTRAVENOUS | Status: DC

## 2021-04-25 MED ORDER — LIDOCAINE HCL (CARDIAC) PF 100 MG/5ML IV SOSY
PREFILLED_SYRINGE | INTRAVENOUS | Status: DC | PRN
  Administered 2021-04-25: 30 mg via INTRAVENOUS

## 2021-04-25 MED ORDER — LACTATED RINGERS IV SOLN: INTRAVENOUS | Status: DC

## 2021-04-25 SURGICAL SUPPLY — 26 items
CLIP HMST 235XBRD CATH ROT (MISCELLANEOUS) IMPLANT
CLIP RESOLUTION 360 11X235 (MISCELLANEOUS)
ELECT REM PT RETURN 9FT ADLT (ELECTROSURGICAL)
ELECTRODE REM PT RTRN 9FT ADLT (ELECTROSURGICAL) IMPLANT
FCP ESCP3.2XJMB 240X2.8X (MISCELLANEOUS)
FORCEPS BIOP RAD 4 LRG CAP 4 (CUTTING FORCEPS) IMPLANT
FORCEPS BIOP RJ4 240 W/NDL (MISCELLANEOUS)
FORCEPS ESCP3.2XJMB 240X2.8X (MISCELLANEOUS) IMPLANT
GOWN CVR UNV OPN BCK APRN NK (MISCELLANEOUS) ×4 IMPLANT
GOWN ISOL THUMB LOOP REG UNIV (MISCELLANEOUS) ×6
INJECTOR VARIJECT VIN23 (MISCELLANEOUS) IMPLANT
KIT DEFENDO VALVE AND CONN (KITS) IMPLANT
KIT PRC NS LF DISP ENDO (KITS) ×2 IMPLANT
KIT PROCEDURE OLYMPUS (KITS) ×3
MANIFOLD NEPTUNE II (INSTRUMENTS) ×3 IMPLANT
MARKER SPOT ENDO TATTOO 5ML (MISCELLANEOUS) IMPLANT
PROBE APC STR FIRE (PROBE) IMPLANT
RETRIEVER NET ROTH 2.5X230 LF (MISCELLANEOUS) IMPLANT
SNARE COLD EXACTO (MISCELLANEOUS) ×1 IMPLANT
SNARE SHORT THROW 13M SML OVAL (MISCELLANEOUS) IMPLANT
SNARE SHORT THROW 30M LRG OVAL (MISCELLANEOUS) IMPLANT
SNARE SNG USE RND 15MM (INSTRUMENTS) IMPLANT
SPOT EX ENDOSCOPIC TATTOO (MISCELLANEOUS)
TRAP ETRAP POLY (MISCELLANEOUS) ×1 IMPLANT
VARIJECT INJECTOR VIN23 (MISCELLANEOUS)
WATER STERILE IRR 250ML POUR (IV SOLUTION) ×3 IMPLANT

## 2021-04-25 NOTE — Anesthesia Postprocedure Evaluation (Signed)
Anesthesia Post Note  Patient: Catherine Gilbert  Procedure(s) Performed: COLONOSCOPY WITH PROPOFOL POLYPECTOMY     Patient location during evaluation: PACU Anesthesia Type: General Level of consciousness: awake and alert and oriented Pain management: satisfactory to patient Vital Signs Assessment: post-procedure vital signs reviewed and stable Respiratory status: spontaneous breathing, nonlabored ventilation and respiratory function stable Cardiovascular status: blood pressure returned to baseline and stable Postop Assessment: Adequate PO intake and No signs of nausea or vomiting Anesthetic complications: no   No notable events documented.  Raliegh Ip

## 2021-04-25 NOTE — H&P (Signed)
Cephas Darby, MD [address]  [place]  [place], [place] [address]  Main: [phone]  Fax: [phone] Pager: [phone]  Primary Care Physician:  Lesleigh Noe, MD Primary Gastroenterologist:  Dr. Cephas Darby  Pre-Procedure History & Physical: HPI:  Catherine Gilbert is a 45 y.o. female is here for an colonoscopy.   Past Medical History:  Diagnosis Date   Abnormal Pap smear    Anemia    Anxiety    Anxiety state 04/23/2011   Cancer ([place]) 02/2018   lung rt   Depression    Dysmenorrhea 08/27/2011   Dyspnea on exertion 10/30/2015   Essential hypertension 09/15/2012   Family history of ASCVD (arteriosclerotic cardiovascular disease) 12/31/2016   Family history of premature CAD 11/11/2016   GAD (generalized anxiety disorder) 10/30/2015   GERD (gastroesophageal reflux disease) 05/29/2014   Headache 01/01/2017   HSV (herpes simplex virus) infection    Hypertension    Initiation of Depo Provera 09/24/2011   Lung nodule    RLL on coronary calcium score CT   Migraine headache     Past Surgical History:  Procedure Laterality Date   BREAST CYST ASPIRATION Right 02/10/2018   neg   CESAREAN SECTION     x 3    COLPOSCOPY  2010   DILATION AND CURETTAGE OF UTERUS     FLEXIBLE BRONCHOSCOPY N/A 03/29/2018   Procedure: PREOP  BRONCHOSCOPY;  Surgeon: Nestor Lewandowsky, MD;  Location: [facility] ORS;  Service: Thoracic;  Laterality: N/A;   HYSTEROSCOPY WITH D & C N/A 01/12/2020   Procedure: DILATATION AND CURETTAGE /HYSTEROSCOPY;  Surgeon: Homero Fellers, MD;  Location: [facility] ORS;  Service: Gynecology;  Laterality: N/A;   THORACOTOMY Right 03/29/2018   Procedure: THORACOTOMY MAJOR- POSSIBLE LOBECTOMY;  Surgeon: Nestor Lewandowsky, MD;  Location: [facility] ORS;  Service: Thoracic;  Laterality: Right;   TUBAL LIGATION     VIDEO ASSISTED THORACOSCOPY (VATS)/WEDGE RESECTION Right 03/29/2018   Procedure: VIDEO ASSISTED THORACOSCOPY (VATS)/WEDGE RESECTION;  Surgeon: Nestor Lewandowsky, MD;  Location: [facility]  ORS;  Service: Thoracic;  Laterality: Right;    Prior to Admission medications   Medication Sig Start Date End Date Taking? Authorizing Provider  ALPRAZolam Duanne Moron) 0.5 MG tablet Take 1 tablet (0.5 mg total) by mouth at bedtime as needed. 03/25/21  Yes Lesleigh Noe, MD  Aspirin-Caffeine (BC FAST PAIN RELIEF PO) Take 1 packet by mouth daily as needed (headache).   Yes [provider]  lisinopril (ZESTRIL) 10 MG tablet Take 1/2-1 qd 09/20/20  Yes Lesleigh Noe, MD  omeprazole (PRILOSEC) 40 MG capsule Take 1 capsule (40 mg total) by mouth daily. 09/20/20  Yes Lesleigh Noe, MD  sertraline (ZOLOFT) 100 MG tablet Take 1.5 tablets (150 mg total) by mouth daily. 03/25/21  Yes Lesleigh Noe, MD  valACYclovir (VALTREX) 500 MG tablet TAKE 2 TABLETS BY MOUTH TODAY, THEN 2 TABLETS TWICE DAILY AS NEEDED. 08/01/19  Yes Lucille Passy, MD    Allergies as of 03/26/2021   (No Known Allergies)    Family History  Problem Relation Age of Onset   Stroke Mother 61       massive stroke   Heart disease Father 85       congestive heart failure   Hypertension Father    Heart disease Brother 51       Suspected MI and SCD   Heart disease Sister    Stroke Sister 47   Breast cancer Maternal Grandmother 65  Breast cancer Cousin        x2 paternal, both alive    Social History   Socioeconomic History   Marital status: Married    Spouse name: Francee Piccolo   Number of children: 3   Years of education: High school   Highest education level: Not on file  Occupational History   Not on file  Tobacco Use   Smoking status: Former    Packs/day: 1.00    Years: 25.00    Pack years: 25.00    Types: Cigarettes    Quit date: 11/22/2015    Years since quitting: 5.4   Smokeless tobacco: Never  Vaping Use   Vaping Use: Never used  Substance and Sexual Activity   Alcohol use: Yes    Comment: rare   Drug use: No   Sexual activity: Yes    Birth control/protection: Surgical    Comment: tubal ligation   Other Topics Concern   Not on file  Social History Narrative   10/13/19   From: the area   Living: Husband, Francee Piccolo (2000) and 3 children   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      Family: not close with family that are living, but good relationship with children      Enjoys: binge watch TV, crystals - metaphysical studies      Exercise: not currently   Diet: pretty good, lunch - salads, home cooking most of the time      Safety   Seat belts: Yes    Guns: Yes  and secure   Safe in relationships: Yes    Social Determinants of Health   Financial Resource Strain: Not on file  Food Insecurity: Not on file  Transportation Needs: Not on file  Physical Activity: Not on file  Stress: Not on file  Social Connections: Not on file  Intimate Partner Violence: Not on file    Review of Systems: See HPI, otherwise negative ROS  Physical Exam: BP (!) 142/85   Pulse 64   Temp 98.1 F (36.7 C) (Temporal)   Resp 20   Ht 5' 4.75" (1.645 m)   Wt 65.3 kg   LMP 03/27/2021 (Exact Date)   SpO2 97%   BMI 24.15 kg/m  General:   Alert,  pleasant and cooperative in NAD Head:  Normocephalic and atraumatic. Neck:  Supple; no masses or thyromegaly. Lungs:  Clear throughout to auscultation.    Heart:  Regular rate and rhythm. Abdomen:  Soft, nontender and nondistended. Normal bowel sounds, without guarding, and without rebound.   Neurologic:  Alert and  oriented x4;  grossly normal neurologically.  Impression/Plan: KLEA NALL is here for an colonoscopy to be performed for colon cancer screening  Risks, benefits, limitations, and alternatives regarding  colonoscopy have been reviewed with the patient.  Questions have been answered.  All parties agreeable.   Sherri Sear, MD  04/25/2021, 8:16 AM

## 2021-04-25 NOTE — Op Note (Signed)
[facility] Gastroenterology Patient Name: Catherine Gilbert Procedure Date: 04/25/2021 8:47 AM MRN: [id number] Account #: [id number] Date of Birth: 10-May-1976 Admit Type: Outpatient Age: 45 Room: [facility] OR ROOM 01 Gender: Female Note Status: Finalized Instrument Name: [id number] Procedure:             Colonoscopy Indications:           Screening for colorectal malignant neoplasm, This is                         the patient's first colonoscopy Providers:             Lin Landsman MD, MD Referring MD:          Jobe Marker. Einar Pheasant (Referring MD) Medicines:             General Anesthesia Complications:         No immediate complications. Estimated blood loss: None. Procedure:             Pre-Anesthesia Assessment:                        - Prior to the procedure, a History and Physical was                         performed, and patient medications and allergies were                         reviewed. The patient is competent. The risks and                         benefits of the procedure and the sedation options and                         risks were discussed with the patient. All questions                         were answered and informed consent was obtained.                         Patient identification and proposed procedure were                         verified by the physician, the nurse, the                         anesthesiologist, the anesthetist and the technician                         in the pre-procedure area in the procedure room in the                         endoscopy suite. Mental Status Examination: alert and                         oriented. Airway Examination: normal oropharyngeal                         airway and neck mobility. Respiratory Examination:  clear to auscultation. CV Examination: normal.                         Prophylactic Antibiotics: The patient does not require                         prophylactic antibiotics.  Prior Anticoagulants: The                         patient has taken no previous anticoagulant or                         antiplatelet agents. ASA Grade Assessment: II - A                         patient with mild systemic disease. After reviewing                         the risks and benefits, the patient was deemed in                         satisfactory condition to undergo the procedure. The                         anesthesia plan was to use general anesthesia.                         Immediately prior to administration of medications,                         the patient was re-assessed for adequacy to receive                         sedatives. The heart rate, respiratory rate, oxygen                         saturations, blood pressure, adequacy of pulmonary                         ventilation, and response to care were monitored                         throughout the procedure. The physical status of the                         patient was re-assessed after the procedure.                        After obtaining informed consent, the colonoscope was                         passed under direct vision. Throughout the procedure,                         the patient's blood pressure, pulse, and oxygen                         saturations were monitored continuously. The  Colonoscope was introduced through the anus and                         advanced to the the terminal ileum, with                         identification of the appendiceal orifice and IC                         valve. The colonoscopy was performed without                         difficulty. The patient tolerated the procedure well.                         The quality of the bowel preparation was evaluated                         using the BBPS [facility] Bowel Preparation Scale) with                         scores of: Right Colon = 3, Transverse Colon = 3 and                         Left Colon = 3 (entire  mucosa seen well with no                         residual staining, small fragments of stool or opaque                         liquid). The total BBPS score equals 9. Findings:      The perianal and digital rectal examinations were normal. Pertinent       negatives include normal sphincter tone and no palpable rectal lesions.      Two sessile polyps were found in the recto-sigmoid colon and descending       colon. The polyps were 3 to 4 mm in size. These polyps were removed with       a cold snare. Resection and retrieval were complete. Estimated blood       loss: none.      Unable to perform retroflexion      The exam was otherwise without abnormality.      The terminal ileum appeared normal. Impression:            - Two 3 to 4 mm polyps at the recto-sigmoid colon and                         in the descending colon, removed with a cold snare.                         Resected and retrieved.                        - The examination was otherwise normal.                        - The examined portion of the ileum was normal. Recommendation:        -  Discharge patient to home (with escort).                        - Resume regular diet today.                        - Continue present medications.                        - Await pathology results.                        - Repeat colonoscopy in 7-10 years for surveillance                         based on pathology results. Procedure Code(s):     --- Professional ---                        [phone], Colonoscopy, flexible; with removal of                         tumor(s), polyp(s), or other lesion(s) by snare                         technique Diagnosis Code(s):     --- Professional ---                        Z12.11, Encounter for screening for malignant neoplasm                         of colon                        K63.5, Polyp of colon CPT copyright 2019 American Medical Association. All rights reserved. The codes documented in this report are  preliminary and upon coder review may  be revised to meet current compliance requirements. Dr. Ulyess Mort Lin Landsman MD, MD 04/25/2021 9:14:33 AM This report has been signed electronically. Number of Addenda: 0 Note Initiated On: 04/25/2021 8:47 AM Scope Withdrawal Time: 0 hours 10 minutes 10 seconds  Total Procedure Duration: 0 hours 12 minutes 55 seconds  Estimated Blood Loss:  Estimated blood loss: none.      [facility]

## 2021-04-25 NOTE — Anesthesia Preprocedure Evaluation (Signed)
Anesthesia Evaluation  Patient identified by MRN, date of birth, ID band Patient awake    Reviewed: Allergy & Precautions, H&P , NPO status , Patient's Chart, lab work & pertinent test results  Airway Mallampati: II  TM Distance: >3 FB Neck ROM: full    Dental no notable dental hx.    Pulmonary former smoker,    Pulmonary exam normal breath sounds clear to auscultation       Cardiovascular hypertension, Normal cardiovascular exam Rhythm:regular Rate:Normal     Neuro/Psych  Headaches, Anxiety Depression    GI/Hepatic GERD  ,  Endo/Other    Renal/GU      Musculoskeletal   Abdominal   Peds  Hematology   Anesthesia Other Findings   Reproductive/Obstetrics                             Anesthesia Physical Anesthesia Plan  ASA: 2  Anesthesia Plan: General   Post-op Pain Management:    Induction: Intravenous  PONV Risk Score and Plan: 3 and Treatment may vary due to age or medical condition, Propofol infusion and TIVA  Airway Management Planned: Natural Airway  Additional Equipment:   Intra-op Plan:   Post-operative Plan:   Informed Consent: I have reviewed the patients History and Physical, chart, labs and discussed the procedure including the risks, benefits and alternatives for the proposed anesthesia with the patient or authorized representative who has indicated his/her understanding and acceptance.     Dental Advisory Given  Plan Discussed with: CRNA  Anesthesia Plan Comments:         Anesthesia Quick Evaluation

## 2021-04-25 NOTE — Telephone Encounter (Signed)
Called patient to schedule a 2 -3 week follow uo appointment current avalaible appointment is nov 15-16 at 1pm both days also have nov 17 at 1:15 or 3:00 pm double book

## 2021-04-25 NOTE — Transfer of Care (Signed)
Immediate Anesthesia Transfer of Care Note  Patient: Catherine Gilbert  Procedure(s) Performed: COLONOSCOPY WITH PROPOFOL POLYPECTOMY  Patient Location: PACU  Anesthesia Type: General  Level of Consciousness: awake, alert  and patient cooperative  Airway and Oxygen Therapy: Patient Spontanous Breathing and Patient connected to supplemental oxygen  Post-op Assessment: Post-op Vital signs reviewed, Patient's Cardiovascular Status Stable, Respiratory Function Stable, Patent Airway and No signs of Nausea or vomiting  Post-op Vital Signs: Reviewed and stable  Complications: No notable events documented.

## 2021-04-26 LAB — SURGICAL PATHOLOGY

## 2021-04-29 ENCOUNTER — Encounter: Payer: Self-pay | Admitting: Gastroenterology

## 2021-05-01 ENCOUNTER — Telehealth: Payer: Self-pay | Admitting: *Deleted

## 2021-05-01 ENCOUNTER — Other Ambulatory Visit: Payer: Self-pay | Admitting: *Deleted

## 2021-05-01 DIAGNOSIS — C3431 Malignant neoplasm of lower lobe, right bronchus or lung: Secondary | ICD-10-CM

## 2021-05-01 NOTE — Telephone Encounter (Signed)
Colette- pls arrange for 1 year follow-up in the cancer.

## 2021-05-01 NOTE — Telephone Encounter (Signed)
spoke with patient. Pt agreeable to plan of care. Will follow-up with Dr. B in 1 year.

## 2021-05-01 NOTE — Telephone Encounter (Signed)
I asked Catherine Gilbert for help scheduling this patient I dont have clearance to overbook

## 2021-05-01 NOTE — Telephone Encounter (Signed)
-----   Message from Cammie Sickle, MD sent at 04/30/2021  9:31 PM EDT ----- Regarding: Ct scan results H/ Please inform patient that CT scan does not show any evidence of cancer. Recommend follow up in one year. MD labs CBCCMP. Thanks,  GB

## 2021-05-15 ENCOUNTER — Ambulatory Visit (INDEPENDENT_AMBULATORY_CARE_PROVIDER_SITE_OTHER): Payer: BC Managed Care – PPO | Admitting: Gastroenterology

## 2021-05-15 ENCOUNTER — Encounter: Payer: Self-pay | Admitting: Gastroenterology

## 2021-05-15 VITALS — BP 135/86 | HR 84 | Temp 98.6°F | Ht 64.0 in | Wt 151.8 lb

## 2021-05-15 DIAGNOSIS — D509 Iron deficiency anemia, unspecified: Secondary | ICD-10-CM | POA: Diagnosis not present

## 2021-05-15 DIAGNOSIS — K219 Gastro-esophageal reflux disease without esophagitis: Secondary | ICD-10-CM

## 2021-05-15 MED ORDER — OMEPRAZOLE 20 MG PO CPDR: 20.0000 mg | DELAYED_RELEASE_CAPSULE | Freq: Two times a day (BID) | ORAL | 0 refills | Status: DC

## 2021-05-15 NOTE — Progress Notes (Signed)
Catherine Darby, MD [address]  [place], [place] [address]  Main: [phone]  Fax: [phone]    Gastroenterology Consultation  Referring Provider:     Lesleigh Noe, MD Primary Care Physician:  Catherine Noe, MD Primary Gastroenterologist:  Dr. Cephas Gilbert Reason for Consultation:     Chronic GERD, iron deficiency anemia        HPI:   Catherine Gilbert is a 45 y.o. female referred by Dr. Einar Gilbert, Catherine Marker, MD  for consultation & management of chronic GERD, iron deficiency anemia.  Patient is here to discuss about long-term usage of PPI.  Patient reports that she has several years history of burning in her chest associated with severe regurgitation which is postprandial.  This is approximately 15 years ago when she had an upper endoscopy for reflux and since then she has been on omeprazole 40 mg daily before breakfast.  She reports that her symptoms return the next day if she misses the dose.  Prior to being on PPI, patient was experiencing flareups of acid reflux with severe nausea and vomiting, poor p.o. intake that would last for 1 week.  Patient is concerned about long-term usage of PPI.  Patient has iron deficiency anemia, most recent hemoglobin 11.8, serum ferritin levels were below 50 in 2020.  She was just told by her PCP to start oral iron which she has not started yet.  She does report irregular bowel habits and abdominal bloating.  She does acknowledge that she may not be incorporating enough fiber in her diet.  She does admit to smoking [place].  Patient reports that she has been experiencing chronic headaches and takes Catherine Gilbert fast pain relief occasionally  She does not smoke tobacco, denies alcohol use  NSAIDs: None  Antiplts/Anticoagulants/Anti thrombotics: None  GI Procedures:  Colonoscopy 04/25/2021 for screening - Two 3 to 4 mm polyps at the recto-sigmoid colon and in the descending colon, removed with a cold snare. Resected and retrieved. - The  examination was otherwise normal. - The examined portion of the ileum was normal.  DIAGNOSIS:  A. COLON POLYP, DESCENDING; COLD SNARE:  - FEATURES SUGGESTIVE OF HYPERPLASTIC POLYP.  - NEGATIVE FOR DYSPLASIA AND MALIGNANCY.   Comment:  Deeper levels were examined.   B. COLON POLYP, RECTOSIGMOID; COLD SNARE:  - HYPERPLASTIC POLYP.  - NEGATIVE FOR DYSPLASIA AND MALIGNANCY. Past Medical History:  Diagnosis Date   Abnormal Pap smear    Anemia    Anxiety    Anxiety state 04/23/2011   Cancer ([place]) 02/2018   lung rt   Depression    Dysmenorrhea 08/27/2011   Dyspnea on exertion 10/30/2015   Essential hypertension 09/15/2012   Family history of ASCVD (arteriosclerotic cardiovascular disease) 12/31/2016   Family history of premature CAD 11/11/2016   GAD (generalized anxiety disorder) 10/30/2015   GERD (gastroesophageal reflux disease) 05/29/2014   Headache 01/01/2017   HSV (herpes simplex virus) infection    Hypertension    Initiation of Depo Provera 09/24/2011   Lung nodule    RLL on coronary calcium score CT   Migraine headache     Past Surgical History:  Procedure Laterality Date   BREAST CYST ASPIRATION Right 02/10/2018   neg   CESAREAN SECTION     x 3    COLONOSCOPY WITH PROPOFOL N/A 04/25/2021   Procedure: COLONOSCOPY WITH PROPOFOL;  Surgeon: Catherine Landsman, MD;  Location: [facility];  Service: Endoscopy;  Laterality: N/A;  COLPOSCOPY  2010   DILATION AND CURETTAGE OF UTERUS     FLEXIBLE BRONCHOSCOPY N/A 03/29/2018   Procedure: PREOP  BRONCHOSCOPY;  Surgeon: Nestor Lewandowsky, MD;  Location: [facility];  Service: Thoracic;  Laterality: N/A;   HYSTEROSCOPY WITH D & C N/A 01/12/2020   Procedure: DILATATION AND CURETTAGE /HYSTEROSCOPY;  Surgeon: Homero Fellers, MD;  Location: [facility];  Service: Gynecology;  Laterality: N/A;   POLYPECTOMY  04/25/2021   Procedure: POLYPECTOMY;  Surgeon: Catherine Landsman, MD;  Location: [facility];  Service: Endoscopy;;    THORACOTOMY Right 03/29/2018   Procedure: THORACOTOMY MAJOR- POSSIBLE LOBECTOMY;  Surgeon: Nestor Lewandowsky, MD;  Location: [facility];  Service: Thoracic;  Laterality: Right;   TUBAL LIGATION     VIDEO ASSISTED THORACOSCOPY (VATS)/WEDGE RESECTION Right 03/29/2018   Procedure: VIDEO ASSISTED THORACOSCOPY (VATS)/WEDGE RESECTION;  Surgeon: Nestor Lewandowsky, MD;  Location: [facility];  Service: Thoracic;  Laterality: Right;    Current Outpatient Medications:    ALPRAZolam (XANAX) 0.5 MG tablet, Take 1 tablet (0.5 mg total) by mouth at bedtime as needed., Disp: 30 tablet, Rfl: 3   Aspirin-Caffeine (BC FAST PAIN RELIEF PO), Take 1 packet by mouth daily as needed (headache)., Disp: , Rfl:    lisinopril (ZESTRIL) 10 MG tablet, Take 1/2-1 qd, Disp: 90 tablet, Rfl: 3   omeprazole (PRILOSEC) 20 MG capsule, Take 1 capsule (20 mg total) by mouth 2 (two) times daily before a meal., Disp: 60 capsule, Rfl: 0   sertraline (ZOLOFT) 100 MG tablet, Take 1.5 tablets (150 mg total) by mouth daily., Disp: 135 tablet, Rfl: 3   valACYclovir (VALTREX) 500 MG tablet, TAKE 2 TABLETS BY MOUTH TODAY, THEN 2 TABLETS TWICE DAILY AS NEEDED., Disp: 60 tablet, Rfl: 5    Family History  Problem Relation Age of Onset   Stroke Mother 58       massive stroke   Heart disease Father 71       congestive heart failure   Hypertension Father    Heart disease Brother 35       Suspected MI and SCD   Heart disease Sister    Stroke Sister 7   Breast cancer Maternal Grandmother 67   Breast cancer Cousin        x2 paternal, both alive     Social History   Tobacco Use   Smoking status: Former    Packs/day: 1.00    Years: 25.00    Pack years: 25.00    Types: Cigarettes    Quit date: 11/22/2015    Years since quitting: 5.4   Smokeless tobacco: Never  Vaping Use   Vaping Use: Never used  Substance Use Topics   Alcohol use: Yes    Comment: rare   Drug use: No    Allergies as of 05/15/2021   (No Known Allergies)    Review of  Systems:    All systems reviewed and negative except where noted in HPI.   Physical Exam:  BP 135/86 (BP Location: Left Arm, Patient Position: Sitting, Cuff Size: Normal)   Pulse 84   Temp 98.6 F (37 C) (Oral)   Ht _0  (1.626 m)   Wt 151 lb 12.8 oz (68.9 kg)   BMI 26.06 kg/m  No LMP recorded.  General:   Alert,  Well-developed, well-nourished, pleasant and cooperative in NAD Head:  Normocephalic and atraumatic. Eyes:  Sclera clear, no icterus.   Conjunctiva pink. Ears:  Normal auditory acuity. Nose:  No deformity, discharge,  or lesions. Mouth:  No deformity or lesions,oropharynx pink & moist. Neck:  Supple; no masses or thyromegaly. Lungs:  Respirations even and unlabored.  Clear throughout to auscultation.   No wheezes, crackles, or rhonchi. No acute distress. Heart:  Regular rate and rhythm; no murmurs, clicks, rubs, or gallops. Abdomen:  Normal bowel sounds. Soft, non-tender and mildly distended secondary to bloating without masses, hepatosplenomegaly or hernias noted.  No guarding or rebound tenderness.   Rectal: Not performed Msk:  Symmetrical without gross deformities. Good, equal movement & strength bilaterally. Pulses:  Normal pulses noted. Extremities:  No clubbing or edema.  No cyanosis. Neurologic:  Alert and oriented x3;  grossly normal neurologically. Skin:  Intact without significant lesions or rashes. No jaundice. Psych:  Alert and cooperative. Normal mood and affect.  Imaging Studies: None  Assessment and Plan:   Catherine Gilbert is a 45 y.o. female with history of right lung cancer stage I, s/p wedge resection in remission, history of iron deficiency anemia, chronic GERD on long-term PPI use is seen in consultation for chronic GERD  Chronic GERD Discussed with patient about antireflux lifestyle, information provided Discussed about the side effects of long-term acid suppression such as B12, iron, vitamin D deficiencies, bone loss Discussed about slow  weaning of current dose of PPI.  Suggested her to try omeprazole 20 mg daily before breakfast Recommend EGD for Barrett's screening due to history of chronic GERD  Iron deficiency anemia Recheck CBC, iron panel, B12 and folate levels Check celiac disease panel EGD with gastric and duodenal biopsies   Follow up in 4 months   Catherine Darby, MD

## 2021-05-15 NOTE — Patient Instructions (Signed)
Gastroesophageal Reflux Disease, Adult Gastroesophageal reflux (GER) happens when acid from the stomach flows up into the tube that connects the mouth and the stomach (esophagus). Normally, food travels down the esophagus and stays in the stomach to be digested. With GER, food and stomach acid sometimes move back up into the esophagus. You may have a disease called gastroesophageal reflux disease (GERD) if the reflux: Happens often. Causes frequent or very bad symptoms. Causes problems such as damage to the esophagus. When this happens, the esophagus becomes sore and swollen. Over time, GERD can make small holes (ulcers) in the lining of the esophagus. What are the causes? This condition is caused by a problem with the muscle between the esophagus and the stomach. When this muscle is weak or not normal, it does not close properly to keep food and acid from coming back up from the stomach. The muscle can be weak because of: Tobacco use. Pregnancy. Having a certain type of hernia (hiatal hernia). Alcohol use. Certain foods and drinks, such as coffee, chocolate, onions, and peppermint. What increases the risk? Being overweight. Having a disease that affects your connective tissue. Taking NSAIDs, such a ibuprofen. What are the signs or symptoms? Heartburn. Difficult or painful swallowing. The feeling of having a lump in the throat. A bitter taste in the mouth. Bad breath. Having a lot of saliva. Having an upset or bloated stomach. Burping. Chest pain. Different conditions can cause chest pain. Make sure you see your doctor if you have chest pain. Shortness of breath or wheezing. A long-term cough or a cough at night. Wearing away of the surface of teeth (tooth enamel). Weight loss. How is this treated? Making changes to your diet. Taking medicine. Having surgery. Treatment will depend on how bad your symptoms are. Follow these instructions at home: Eating and drinking  Follow a  diet as told by your doctor. You may need to avoid foods and drinks such as: Coffee and tea, with or without caffeine. Drinks that contain alcohol. Energy drinks and sports drinks. Bubbly (carbonated) drinks or sodas. Chocolate and cocoa. Peppermint and mint flavorings. Garlic and onions. Horseradish. Spicy and acidic foods. These include peppers, chili powder, curry powder, vinegar, hot sauces, and BBQ sauce. Citrus fruit juices and citrus fruits, such as oranges, lemons, and limes. Tomato-based foods. These include red sauce, chili, salsa, and pizza with red sauce. Fried and fatty foods. These include donuts, french fries, potato chips, and high-fat dressings. High-fat meats. These include hot dogs, rib eye steak, sausage, ham, and bacon. High-fat dairy items, such as whole milk, butter, and cream cheese. Eat small meals often. Avoid eating large meals. Avoid drinking large amounts of liquid with your meals. Avoid eating meals during the 2-3 hours before bedtime. Avoid lying down right after you eat. Do not exercise right after you eat. Lifestyle  Do not smoke or use any products that contain nicotine or tobacco. If you need help quitting, ask your doctor. Try to lower your stress. If you need help doing this, ask your doctor. If you are overweight, lose an amount of weight that is healthy for you. Ask your doctor about a safe weight loss goal. General instructions Pay attention to any changes in your symptoms. Take over-the-counter and prescription medicines only as told by your doctor. Do not take aspirin, ibuprofen, or other NSAIDs unless your doctor says it is okay. Wear loose clothes. Do not wear anything tight around your waist. Raise (elevate) the head of your bed about 6  inches (15 cm). You may need to use a wedge to do this. Avoid bending over if this makes your symptoms worse. Keep all follow-up visits. Contact a doctor if: You have new symptoms. You lose weight and you  do not know why. You have trouble swallowing or it hurts to swallow. You have wheezing or a cough that keeps happening. You have a hoarse voice. Your symptoms do not get better with treatment. Get help right away if: You have sudden pain in your arms, neck, jaw, teeth, or back. You suddenly feel sweaty, dizzy, or light-headed. You have chest pain or shortness of breath. You vomit and the vomit is green, yellow, or black, or it looks like blood or coffee grounds. You faint. Your poop (stool) is red, bloody, or black. You cannot swallow, drink, or eat. These symptoms may represent a serious problem that is an emergency. Do not wait to see if the symptoms will go away. Get medical help right away. Call your local emergency services (911 in the [place] Do not drive yourself to the hospital. Summary If a person has gastroesophageal reflux disease (GERD), food and stomach acid move back up into the esophagus and cause symptoms or problems such as damage to the esophagus. Treatment will depend on how bad your symptoms are. Follow a diet as told by your doctor. Take all medicines only as told by your doctor. This information is not intended to replace advice given to you by your health care provider. Make sure you discuss any questions you have with your health care provider. Document Revised: 12/26/2019 Document Reviewed: 12/26/2019 Elsevier Patient Education  [place]. High-Fiber Eating Plan Fiber, also called dietary fiber, is a type of carbohydrate. It is found foods such as fruits, vegetables, whole grains, and beans. A high-fiber diet can have many health benefits. Your health care provider may recommend a high-fiber diet to help: Prevent constipation. Fiber can make your bowel movements more regular. Lower your cholesterol. Relieve the following conditions: Inflammation of veins in the anus (hemorrhoids). Inflammation of specific areas of the digestive tract (uncomplicated  diverticulosis). A problem of the large intestine, also called the colon, that sometimes causes pain and diarrhea (irritable bowel syndrome, or IBS). Prevent overeating as part of a weight-loss plan. Prevent heart disease, type 2 diabetes, and certain cancers. What are tips for following this plan? Reading food labels  Check the nutrition facts label on food products for the amount of dietary fiber. Choose foods that have 5 grams of fiber or more per serving. The goals for recommended daily fiber intake include: Men (age 69 or younger): 34-38 g. Men (over age 78): 28-34 g. Women (age 63 or younger): 25-28 g. Women (over age 32): 22-25 g. Your daily fiber goal is _____________ g. Shopping Choose whole fruits and vegetables instead of processed forms, such as apple juice or applesauce. Choose a wide variety of high-fiber foods such as avocados, lentils, oats, and kidney beans. Read the nutrition facts label of the foods you choose. Be aware of foods with added fiber. These foods often have high sugar and sodium amounts per serving. Cooking Use whole-grain flour for baking and cooking. Cook with brown rice instead of white rice. Meal planning Start the day with a breakfast that is high in fiber, such as a cereal that contains 5 g of fiber or more per serving. Eat breads and cereals that are made with whole-grain flour instead of refined flour or white flour. Eat brown rice, bulgur  wheat, or millet instead of white rice. Use beans in place of meat in soups, salads, and pasta dishes. Be sure that half of the grains you eat each day are whole grains. General information You can get the recommended daily intake of dietary fiber by: Eating a variety of fruits, vegetables, grains, nuts, and beans. Taking a fiber supplement if you are not able to take in enough fiber in your diet. It is better to get fiber through food than from a supplement. Gradually increase how much fiber you consume. If you  increase your intake of dietary fiber too quickly, you may have bloating, cramping, or gas. Drink plenty of water to help you digest fiber. Choose high-fiber snacks, such as berries, raw vegetables, nuts, and popcorn. What foods should I eat? Fruits Berries. Pears. Apples. Oranges. Avocado. Prunes and raisins. Dried figs. Vegetables Sweet potatoes. Spinach. Kale. Artichokes. Cabbage. Broccoli. Cauliflower. Green peas. Carrots. Squash. Grains Whole-grain breads. Multigrain cereal. Oats and oatmeal. Brown rice. Barley. Bulgur wheat. [place]. Quinoa. Bran muffins. Popcorn. Rye wafer crackers. Meats and other proteins Navy beans, kidney beans, and pinto beans. Soybeans. Split peas. Lentils. Nuts and seeds. Dairy Fiber-fortified yogurt. Beverages Fiber-fortified soy milk. Fiber-fortified orange juice. Other foods Fiber bars. The items listed above may not be a complete list of recommended foods and beverages. Contact a dietitian for more information. What foods should I avoid? Fruits Fruit juice. Cooked, strained fruit. Vegetables Fried potatoes. Canned vegetables. Well-cooked vegetables. Grains White bread. Pasta made with refined flour. White rice. Meats and other proteins Fatty cuts of meat. Fried chicken or fried fish. Dairy Milk. Yogurt. Cream cheese. Sour cream. Fats and oils Butters. Beverages Soft drinks. Other foods Cakes and pastries. The items listed above may not be a complete list of foods and beverages to avoid. Talk with your dietitian about what choices are best for you. Summary Fiber is a type of carbohydrate. It is found in foods such as fruits, vegetables, whole grains, and beans. A high-fiber diet has many benefits. It can help to prevent constipation, lower blood cholesterol, aid weight loss, and reduce your risk of heart disease, diabetes, and certain cancers. Increase your intake of fiber gradually. Increasing fiber too quickly may cause cramping, bloating,  and gas. Drink plenty of water while you increase the amount of fiber you consume. The best sources of fiber include whole fruits and vegetables, whole grains, nuts, seeds, and beans. This information is not intended to replace advice given to you by your health care provider. Make sure you discuss any questions you have with your health care provider. Document Revised: 10/20/2019 Document Reviewed: 10/20/2019 Elsevier Patient Education  2022 [employer].

## 2021-05-16 ENCOUNTER — Encounter: Payer: Self-pay | Admitting: Gastroenterology

## 2021-05-17 LAB — CBC
Hematocrit: 36.5 % (ref 34.0–46.6)
Hemoglobin: 12.5 g/dL (ref 11.1–15.9)
MCH: 30.5 pg (ref 26.6–33.0)
MCHC: 34.2 g/dL (ref 31.5–35.7)
MCV: 89 fL (ref 79–97)
Platelets: 281 10*3/uL (ref 150–450)
RBC: 4.1 x10E6/uL (ref 3.77–5.28)
RDW: 12.9 % (ref 11.7–15.4)
WBC: 9.4 10*3/uL (ref 3.4–10.8)

## 2021-05-17 LAB — CELIAC DISEASE PANEL
Endomysial IgA: NEGATIVE
IgA/Immunoglobulin A, Serum: 162 mg/dL (ref 87–352)
Transglutaminase IgA: <2 U/mL (ref 0–3)

## 2021-05-17 LAB — IRON,TIBC AND FERRITIN PANEL
Ferritin: 18 ng/mL (ref 15–150)
Iron Saturation: 14 % — ABNORMAL LOW (ref 15–55)
Iron: 43 ug/dL (ref 27–159)
Total Iron Binding Capacity: 318 ug/dL (ref 250–450)
UIBC: 275 ug/dL (ref 131–425)

## 2021-05-17 LAB — B12 AND FOLATE PANEL
Folate: 9.2 ng/mL (ref >3.0)
Vitamin B-12: 962 pg/mL (ref 232–1245)

## 2021-06-01 IMAGING — MG MM DIGITAL DIAGNOSTIC UNILAT*L* W/ TOMO W/ CAD
4 series · 4 of 12 positions shown · non-contrast
Comparison: Previous exam(s).

CLINICAL DATA: Recall from screening mammogram for multiple masses
in the left breast.

EXAM:
DIGITAL DIAGNOSTIC UNILATERAL LEFT MAMMOGRAM WITH TOMOSYNTHESIS AND
CAD; ULTRASOUND LEFT BREAST LIMITED
TECHNIQUE: Left digital diagnostic mammography and breast tomosynthesis was
performed. The images were evaluated with computer-aided detection.;
Targeted ultrasound examination of the left breast was performed

[L MLO synth-2D]
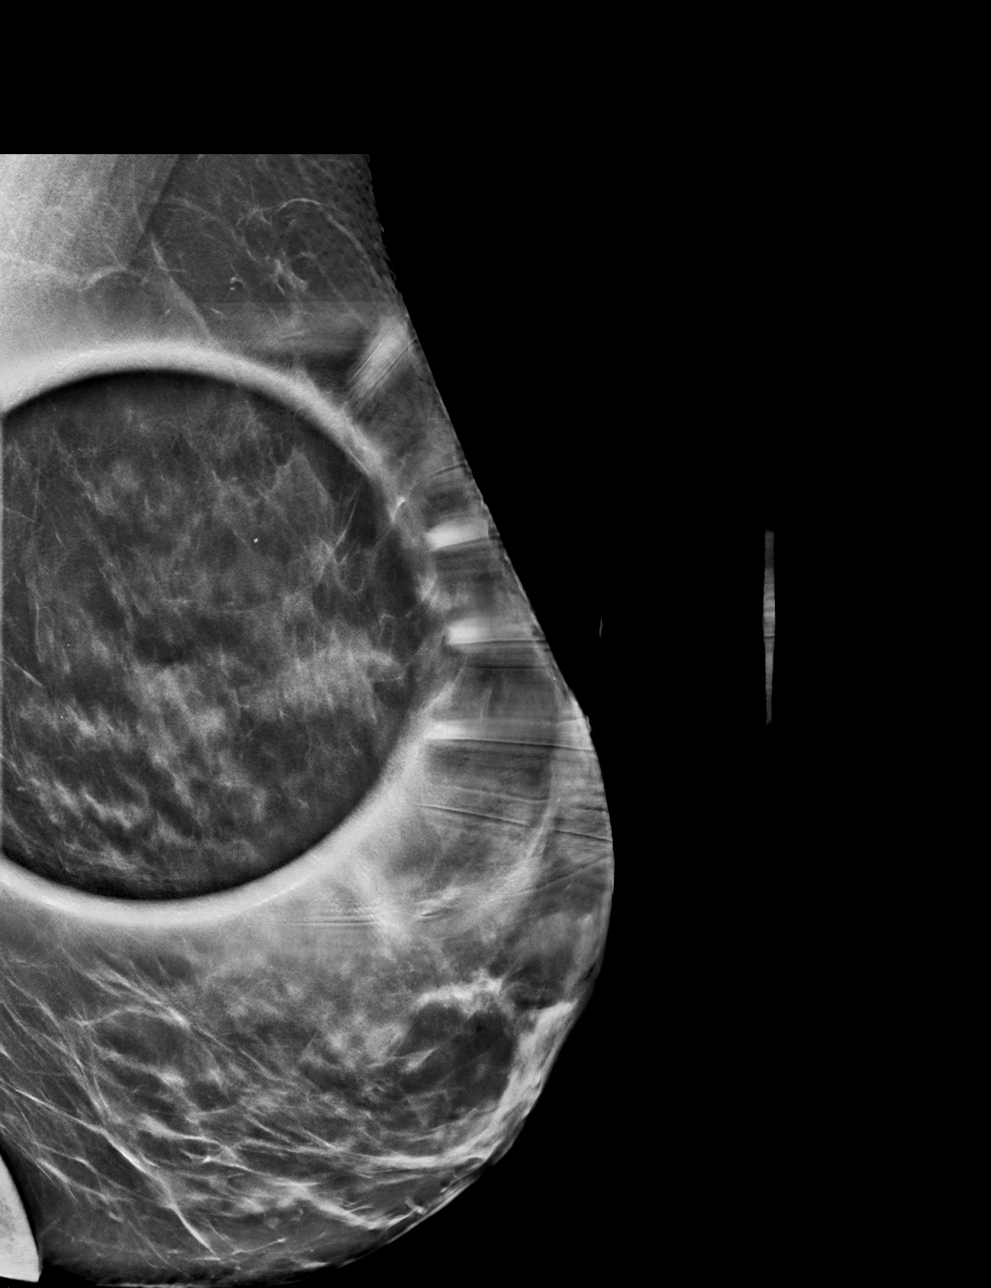

[L CC synth-2D]
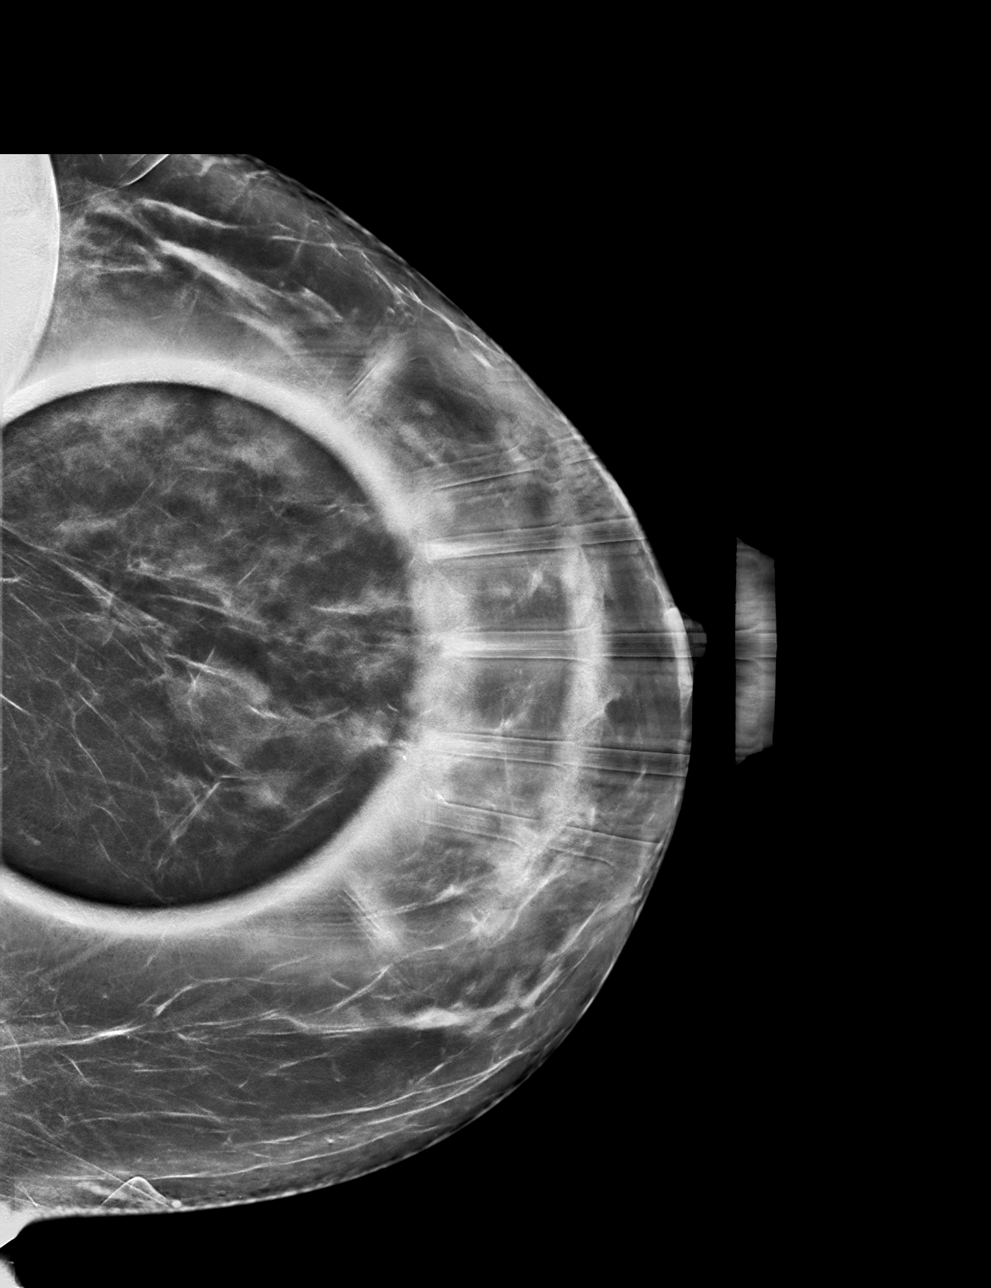

[L CC tomo · tomo slice 41/82.0]
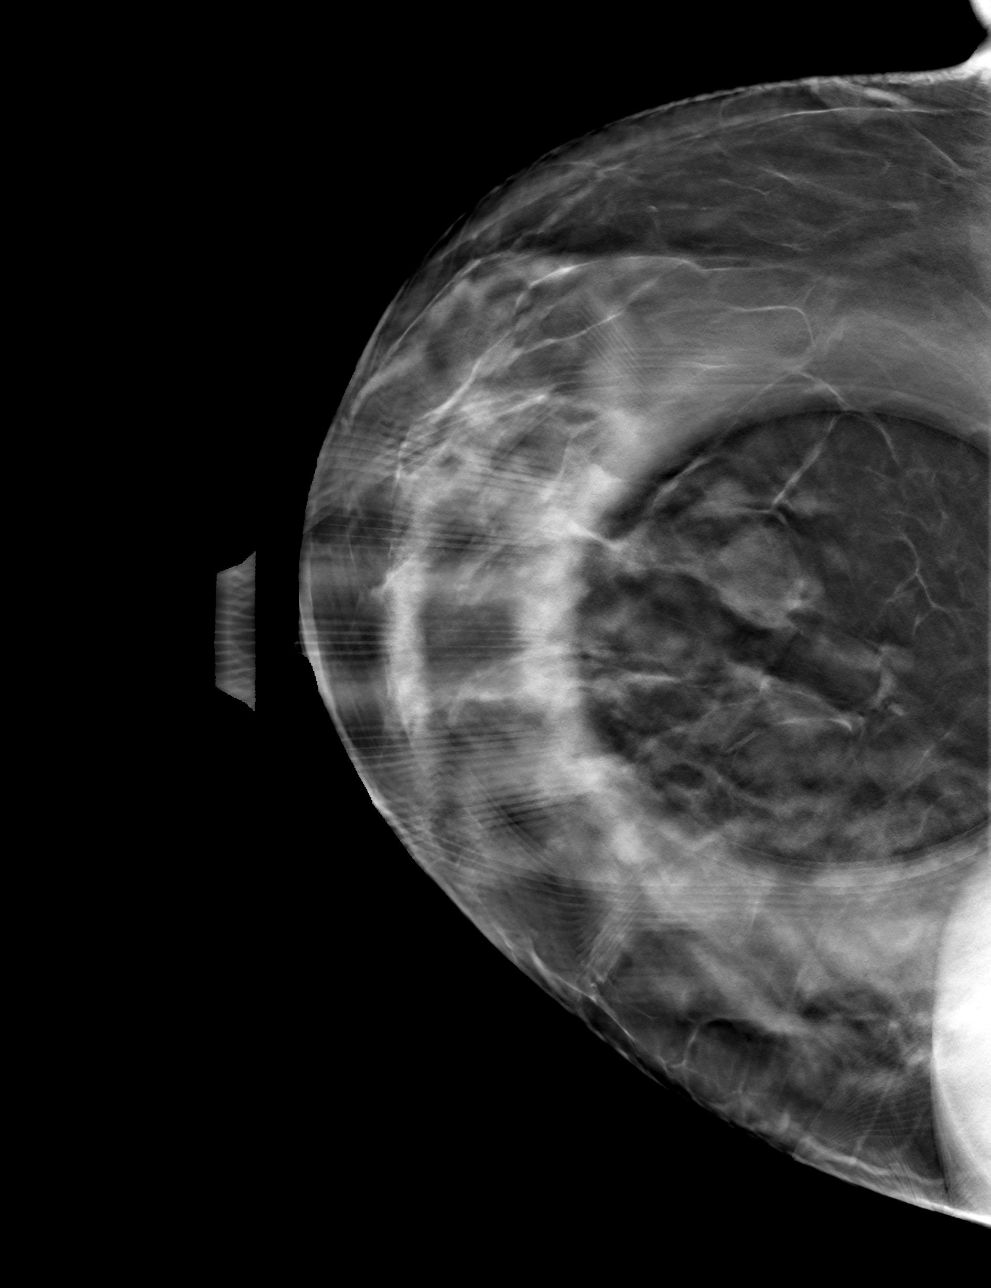

[L MLO tomo · tomo slice 44/87.0]
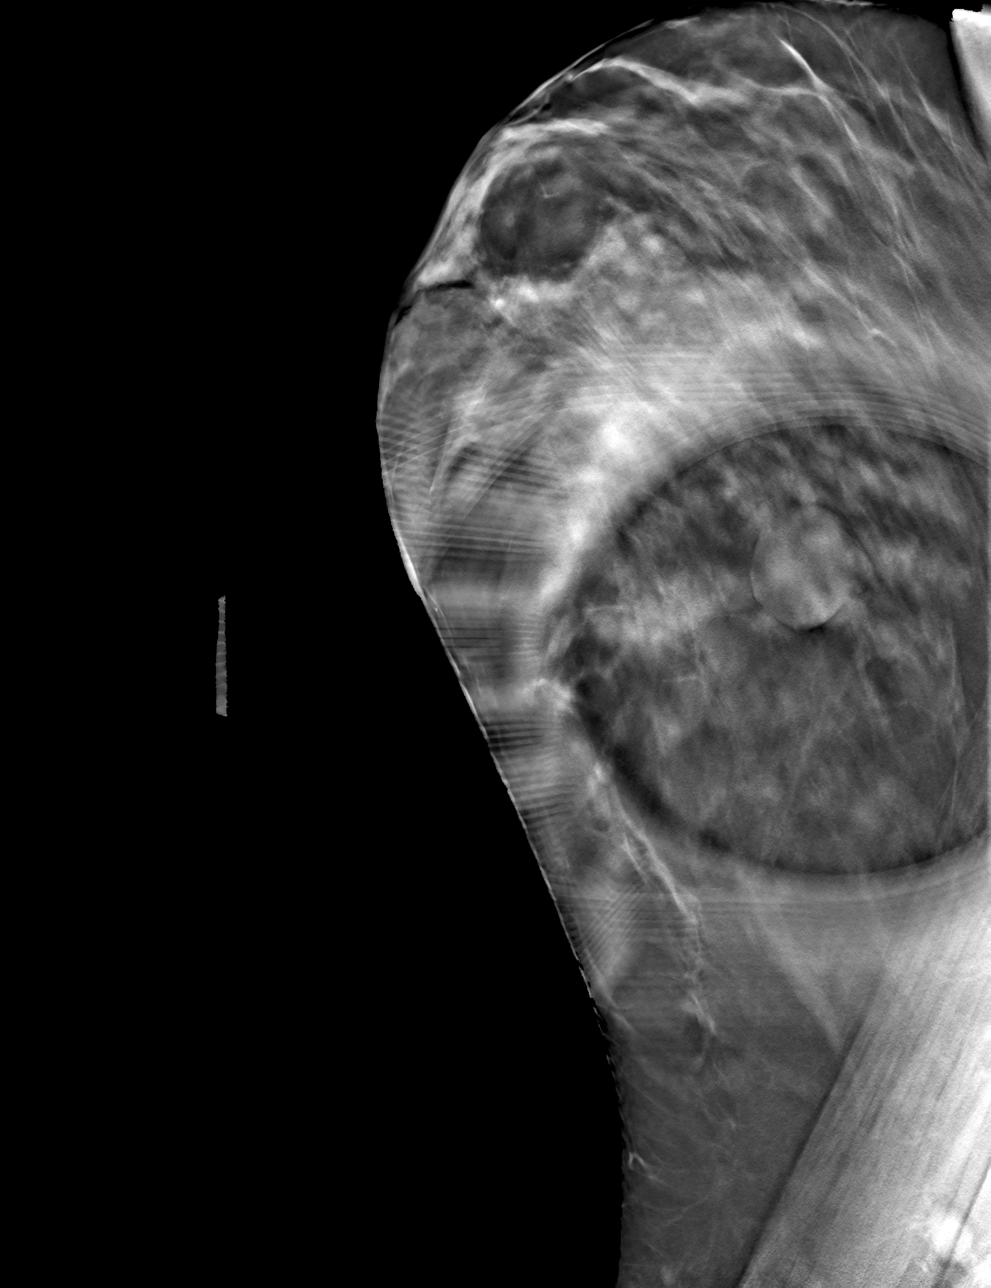

[4 of 12 positions shown; findings below may reference images not displayed]

ACR Breast Density Category c: The breast tissue is heterogeneously
dense, which may obscure small masses.
FINDINGS: Additional views including spot compression demonstrate an oval
circumscribed 1.9 cm mass in the upper central left breast. A 0.7 cm
mass is seen just medial to this and a 0.8 cm mass is seen just
lateral to this.

Targeted left breast ultrasound was performed.

At 12 o'clock 4 cm from the nipple a benign cyst measures 1.9 x
x 1.9 cm. This corresponds to the 1.9 cm mass seen on the mammogram.

At 11:30 o'clock 4 cm from the nipple a benign simple cyst measures
0.7 x 0.8 x 0.5 cm. This corresponds to the 0.7 cm mass seen on the
mammogram.

At 12:30 o'clock 6 cm from the nipple a benign simple cyst measures
0.8 x 0.6 x 0.4 cm. This corresponds to the 0.8 cm mass seen on the
mammogram. No suspicious solid mass is identified.
IMPRESSION: Benign cysts in the left breast.  No evidence of malignancy.

RECOMMENDATION:
Recommend routine annual screening mammogram in 1 year.

I have discussed the findings and recommendations with the patient.
If applicable, a reminder letter will be sent to the patient
regarding the next appointment.

BI-RADS CATEGORY  2: Benign.

## 2021-07-01 ENCOUNTER — Encounter: Payer: Self-pay | Admitting: Family Medicine

## 2021-07-02 ENCOUNTER — Ambulatory Visit
Admission: RE | Admit: 2021-07-02 | Discharge: 2021-07-02 | Disposition: A | Discharge status: Home / Self Care | Payer: Self-pay | Source: Ambulatory Visit

## 2021-07-02 ENCOUNTER — Other Ambulatory Visit: Payer: Self-pay

## 2021-07-02 VITALS — BP 141/90 | HR 87 | Temp 97.9°F | Resp 16

## 2021-07-02 DIAGNOSIS — R300 Dysuria: Secondary | ICD-10-CM | POA: Insufficient documentation

## 2021-07-02 DIAGNOSIS — N390 Urinary tract infection, site not specified: Secondary | ICD-10-CM | POA: Insufficient documentation

## 2021-07-02 LAB — POCT URINALYSIS DIP (MANUAL ENTRY)
Bilirubin, UA: NEGATIVE
Glucose, UA: NEGATIVE mg/dL
Ketones, POC UA: NEGATIVE mg/dL
Nitrite, UA: POSITIVE — AB
Protein Ur, POC: NEGATIVE mg/dL
Spec Grav, UA: 1.015 (ref 1.010–1.025)
Urobilinogen, UA: 0.2 E.U./dL
pH, UA: 7 (ref 5.0–8.0)

## 2021-07-02 MED ORDER — NITROFURANTOIN MONOHYD MACRO 100 MG PO CAPS
100.0000 mg | ORAL_CAPSULE | Freq: Two times a day (BID) | ORAL | 0 refills | Status: DC
Start: 2021-07-02 — End: 2021-07-31

## 2021-07-02 NOTE — ED Provider Notes (Signed)
Roderic Palau    CSN: [id number] Arrival date & time: 07/02/21  1844      History   Chief Complaint Chief Complaint  Patient presents with   Dysuria    HPI Catherine Gilbert is a 46 y.o. female.   HPI   Catherine Gilbert is a 46 y.o. female presents for evaluation of urinary frequency, urgency and dysuria x 4 days, without flank pain, fever, chills, or abnormal vaginal discharge or bleeding. Uncertain of prior urine pathology. Patient's last menstrual period was 06/19/2021.   Past Medical History:  Diagnosis Date   Abnormal Pap smear    Anemia    Anxiety    Anxiety state 04/23/2011   Cancer ([place]) 02/2018   lung rt   Depression    Dysmenorrhea 08/27/2011   Dyspnea on exertion 10/30/2015   Essential hypertension 09/15/2012   Family history of ASCVD (arteriosclerotic cardiovascular disease) 12/31/2016   Family history of premature CAD 11/11/2016   GAD (generalized anxiety disorder) 10/30/2015   GERD (gastroesophageal reflux disease) 05/29/2014   Headache 01/01/2017   HSV (herpes simplex virus) infection    Hypertension    Initiation of Depo Provera 09/24/2011   Lung nodule    RLL on coronary calcium score CT   Migraine headache     Patient Active Problem List   Diagnosis Date Noted   Colon cancer screening    Polyp of colon    Chest pressure 12/27/2020   Nausea 12/27/2020   At risk for obstructive sleep apnea 08/16/2020   Former smoker 08/16/2020   Bradycardia 07/11/2020   Abnormal uterine bleeding    Bilateral carpal tunnel syndrome 10/13/2019   Menorrhagia with regular cycle 08/09/2019   Chest pain of uncertain etiology 96/22/2979   PVC's (premature ventricular contractions) 07/27/2019   Vitamin D deficiency 04/13/2019   Primary adenocarcinoma of right lung ([place]) 10/08/2018   Primary cancer of right lower lobe of lung ([place]) 04/07/2018   Lung nodule, solitary 02/18/2017   Acute nonintractable headache 01/01/2017   Family history of ASCVD (arteriosclerotic  cardiovascular disease) 12/31/2016   Family history of premature CAD 11/11/2016   GAD (generalized anxiety disorder) 10/30/2015   Dyspnea on exertion 10/30/2015   GERD (gastroesophageal reflux disease) 05/29/2014   Essential hypertension 09/15/2012   Dysmenorrhea 08/27/2011   Anxiety and depression 04/23/2011    Past Surgical History:  Procedure Laterality Date   BREAST CYST ASPIRATION Right 02/10/2018   neg   CESAREAN SECTION     x 3    COLONOSCOPY WITH PROPOFOL N/A 04/25/2021   Procedure: COLONOSCOPY WITH PROPOFOL;  Surgeon: Lin Landsman, MD;  Location: [facility];  Service: Endoscopy;  Laterality: N/A;   COLPOSCOPY  2010   DILATION AND CURETTAGE OF UTERUS     FLEXIBLE BRONCHOSCOPY N/A 03/29/2018   Procedure: PREOP  BRONCHOSCOPY;  Surgeon: Nestor Lewandowsky, MD;  Location: [facility];  Service: Thoracic;  Laterality: N/A;   HYSTEROSCOPY WITH D & C N/A 01/12/2020   Procedure: DILATATION AND CURETTAGE /HYSTEROSCOPY;  Surgeon: Homero Fellers, MD;  Location: [facility];  Service: Gynecology;  Laterality: N/A;   POLYPECTOMY  04/25/2021   Procedure: POLYPECTOMY;  Surgeon: Lin Landsman, MD;  Location: [place];  Service: Endoscopy;;   THORACOTOMY Right 03/29/2018   Procedure: THORACOTOMY MAJOR- POSSIBLE LOBECTOMY;  Surgeon: Nestor Lewandowsky, MD;  Location: [facility];  Service: Thoracic;  Laterality: Right;   TUBAL LIGATION     VIDEO ASSISTED THORACOSCOPY (VATS)/WEDGE RESECTION Right 03/29/2018  Procedure: VIDEO ASSISTED THORACOSCOPY (VATS)/WEDGE RESECTION;  Surgeon: Nestor Lewandowsky, MD;  Location: [facility];  Service: Thoracic;  Laterality: Right;    OB History     Gravida  4   Para  3   Term  3   Preterm      AB  1   Living  3      SAB      IAB  1   Ectopic      Multiple      Live Births               Home Medications    Prior to Admission medications   Medication Sig Start Date End Date Taking? Authorizing Provider  ALPRAZolam  Duanne Moron) 0.5 MG tablet Take 1 tablet (0.5 mg total) by mouth at bedtime as needed. 03/25/21  Yes Lesleigh Noe, MD  lisinopril (ZESTRIL) 10 MG tablet Take 1/2-1 qd 09/20/20  Yes Lesleigh Noe, MD  nitrofurantoin, macrocrystal-monohydrate, (MACROBID) 100 MG capsule Take 1 capsule (100 mg total) by mouth 2 (two) times daily. 07/02/21  Yes Scot Jun, FNP  omeprazole (PRILOSEC) 20 MG capsule Take 1 capsule (20 mg total) by mouth 2 (two) times daily before a meal. 05/15/21 07/02/21 Yes Vanga, Tally Due, MD  sertraline (ZOLOFT) 100 MG tablet Take 1.5 tablets (150 mg total) by mouth daily. 03/25/21  Yes Lesleigh Noe, MD  valACYclovir (VALTREX) 500 MG tablet TAKE 2 TABLETS BY MOUTH TODAY, THEN 2 TABLETS TWICE DAILY AS NEEDED. 08/01/19  Yes Lucille Passy, MD  Aspirin-Caffeine [facility] FAST PAIN RELIEF PO) Take 1 packet by mouth daily as needed (headache).    [provider]  Nila Nephew [phone] MG TABS SMARTSIG:12 Tablet(s) By Mouth Daily 04/22/21   [provider]    Family History Family History  Problem Relation Age of Onset   Stroke Mother 70       massive stroke   Heart disease Father 34       congestive heart failure   Hypertension Father    Heart disease Brother 30       Suspected MI and SCD   Heart disease Sister    Stroke Sister 72   Breast cancer Maternal Grandmother 66   Breast cancer Cousin        x2 paternal, both alive    Social History Social History   Tobacco Use   Smoking status: Former    Packs/day: 1.00    Years: 25.00    Pack years: 25.00    Types: Cigarettes    Quit date: 11/22/2015    Years since quitting: 5.6   Smokeless tobacco: Never  Vaping Use   Vaping Use: Every day  Substance Use Topics   Alcohol use: Yes    Comment: rare   Drug use: No     Allergies   Patient has no known allergies.   Review of Systems Review of Systems   Physical Exam Triage Vital Signs ED Triage Vitals  Enc Vitals Group     BP      Pulse       Resp      Temp      Temp src      SpO2      Weight      Height      Head Circumference      Peak Flow      Pain Score      Pain Loc  Pain Edu?      Excl. in [place]?    No data found.  Updated Vital Signs BP (!) 141/90 (BP Location: Left Arm)    Pulse 87    Temp 97.9 F (36.6 C) (Oral)    Resp 16    LMP 06/19/2021    SpO2 96%   Visual Acuity Right Eye Distance:   Left Eye Distance:   Bilateral Distance:    Right Eye Near:   Left Eye Near:    Bilateral Near:     Physical Exam   UC Treatments / Results  Labs (all labs ordered are listed, but only abnormal results are displayed) Labs Reviewed  POCT URINALYSIS DIP (MANUAL ENTRY) - Abnormal; Notable for the following components:      Result Value   Clarity, UA cloudy (*)    Blood, UA trace-intact (*)    Nitrite, UA Positive (*)    Leukocytes, UA Moderate (2+) (*)    All other components within normal limits  URINE CULTURE    EKG   Radiology No results found.  Procedures Procedures (including critical care time)  Medications Ordered in UC Medications - No data to display  Initial Impression / Assessment and Plan / UC Course  I have reviewed the triage vital signs and the nursing notes.  Pertinent labs & imaging results that were available during my care of the patient were reviewed by me and considered in my medical decision making (see chart for details).      UA abnormal and findings consistent with UTI. Empiric antibiotic treatment initiated with Macrobid. Encouraged increase intake of water.Urine culture pending.  ER if symptoms become severe. Follow-up with PCP if symptoms do not completely resolve. Final Clinical Impressions(s) / UC Diagnoses   Final diagnoses:  Acute urinary tract infection   Discharge Instructions   None    ED Prescriptions     Medication Sig Dispense Auth. Provider   nitrofurantoin, macrocrystal-monohydrate, (MACROBID) 100 MG capsule Take 1 capsule (100 mg total) by  mouth 2 (two) times daily. 20 capsule Scot Jun, FNP      PDMP not reviewed this encounter.   Scot Jun, FNP 07/03/21 1526

## 2021-07-02 NOTE — ED Triage Notes (Signed)
Pt c/o dysuria x 4 days

## 2021-07-05 LAB — URINE CULTURE
Culture: >=100000 — AB
Special Requests: NORMAL

## 2021-07-09 ENCOUNTER — Other Ambulatory Visit: Payer: Self-pay

## 2021-07-09 MED ORDER — VALACYCLOVIR HCL 500 MG PO TABS: ORAL_TABLET | ORAL | 5 refills | Status: DC

## 2021-07-10 ENCOUNTER — Telehealth: Payer: Self-pay

## 2021-07-10 NOTE — Telephone Encounter (Signed)
Patient called to reschedule till 08/26/2021 vanga Called endo sent new communications and sent new refferal to nicole

## 2021-07-31 ENCOUNTER — Ambulatory Visit
Admission: EM | Admit: 2021-07-31 | Discharge: 2021-07-31 | Disposition: A | Discharge status: Home / Self Care | Payer: BC Managed Care – PPO | Attending: Emergency Medicine | Admitting: Emergency Medicine

## 2021-07-31 DIAGNOSIS — B9689 Other specified bacterial agents as the cause of diseases classified elsewhere: Secondary | ICD-10-CM | POA: Diagnosis not present

## 2021-07-31 DIAGNOSIS — N760 Acute vaginitis: Secondary | ICD-10-CM | POA: Diagnosis not present

## 2021-07-31 DIAGNOSIS — Z113 Encounter for screening for infections with a predominantly sexual mode of transmission: Secondary | ICD-10-CM | POA: Diagnosis not present

## 2021-07-31 LAB — URINALYSIS, ROUTINE W REFLEX MICROSCOPIC
Bilirubin Urine: NEGATIVE
Glucose, UA: NEGATIVE mg/dL
Hgb urine dipstick: NEGATIVE
Ketones, ur: NEGATIVE mg/dL
Leukocytes,Ua: NEGATIVE
Nitrite: NEGATIVE
Protein, ur: NEGATIVE mg/dL
Specific Gravity, Urine: 1.01 (ref 1.005–1.030)
pH: 6 (ref 5.0–8.0)

## 2021-07-31 LAB — WET PREP, GENITAL
Sperm: NONE SEEN
Trich, Wet Prep: NONE SEEN
WBC, Wet Prep HPF POC: <10 — AB (ref <10)
Yeast Wet Prep HPF POC: NONE SEEN

## 2021-07-31 LAB — PREGNANCY, URINE: Preg Test, Ur: NEGATIVE

## 2021-07-31 MED ORDER — METRONIDAZOLE 500 MG PO TABS: 500.0000 mg | ORAL_TABLET | Freq: Two times a day (BID) | ORAL | 0 refills | Status: AC

## 2021-07-31 NOTE — ED Triage Notes (Signed)
Pt presents for STD/BV/Yeast check.  States she had unprotected sex then had UTI which was treated and resolved but then has discharge that ranges from light pink to clear to yellow.  Also has some itching and odor.  Reports very mild ovarian cramping, unsure if perimenopausal.

## 2021-07-31 NOTE — Discharge Instructions (Addendum)
Urinalysis was negative for urinary tract infection and you are not pregnant.  Your wet prep came back positive for bacterial vaginosis.  I am prescribing you  Flagyl.  Finish it, even if you feel better.  Do not drink alcohol while taking it and for 3 days after finishing it.  Give [place] a working phone number so that we can contact you if needed. Refrain from sexual contact until all of your labs have come back, symptoms have resolved, and your partner(s) are treated if necessary. Return to the ER if you get worse, have a fever >100.4, or for any concerns.   Go to www.goodrx.com  or www.costplusdrugs.com to look up your medications. This will give you a list of where you can find your prescriptions at the most affordable prices. Or ask the pharmacist what the cash price is, or if they have any other discount programs available to help make your medication more affordable. This can be less expensive than what you would pay with insurance.

## 2021-07-31 NOTE — ED Provider Notes (Signed)
HPI  SUBJECTIVE:  Catherine Gilbert is a 46 y.o. female who presents with vaginal odor, light vaginal bleeding described as spotting, and a clear, odorous, thin vaginal discharge.  She reports low abdominal crampy pain that feels like the onset of menses. no nausea, vomiting, fevers, back pain.  She reports occasionally cloudy urine, but has no other urinary complaints.  She is worried about STDs.  She had unprotected sex with a new female partner 2 to 3 weeks ago.  She denies having any other partners currently, and is not sure about him.  She does not know if he is having any symptoms.  No aggravating or alleviating factors.  She has not tried anything for this.  She was recently treated for a UTI.  She has a history of HSV-2, chlamydia, yeast, BV, hypertension, right lung cancer, GERD.  No history of gonorrhea, HIV, syphilis, trichomonas, PID.  LMP: 1/12.  She is irregular.  She denies the possibility being pregnant as she is status post bilateral tubal ligation.    Past Medical History:  Diagnosis Date   Abnormal Pap smear    Anemia    Anxiety    Anxiety state 04/23/2011   Cancer ([place]) 02/2018   lung rt   Depression    Dysmenorrhea 08/27/2011   Dyspnea on exertion 10/30/2015   Essential hypertension 09/15/2012   Family history of ASCVD (arteriosclerotic cardiovascular disease) 12/31/2016   Family history of premature CAD 11/11/2016   GAD (generalized anxiety disorder) 10/30/2015   GERD (gastroesophageal reflux disease) 05/29/2014   Headache 01/01/2017   HSV (herpes simplex virus) infection    Hypertension    Initiation of Depo Provera 09/24/2011   Lung nodule    RLL on coronary calcium score CT   Migraine headache     Past Surgical History:  Procedure Laterality Date   BREAST CYST ASPIRATION Right 02/10/2018   neg   CESAREAN SECTION     x 3    COLONOSCOPY WITH PROPOFOL N/A 04/25/2021   Procedure: COLONOSCOPY WITH PROPOFOL;  Surgeon: Lin Landsman, MD;  Location: [facility];   Service: Endoscopy;  Laterality: N/A;   COLPOSCOPY  2010   DILATION AND CURETTAGE OF UTERUS     FLEXIBLE BRONCHOSCOPY N/A 03/29/2018   Procedure: PREOP  BRONCHOSCOPY;  Surgeon: Nestor Lewandowsky, MD;  Location: [facility];  Service: Thoracic;  Laterality: N/A;   HYSTEROSCOPY WITH D & C N/A 01/12/2020   Procedure: DILATATION AND CURETTAGE /HYSTEROSCOPY;  Surgeon: Homero Fellers, MD;  Location: [facility];  Service: Gynecology;  Laterality: N/A;   POLYPECTOMY  04/25/2021   Procedure: POLYPECTOMY;  Surgeon: Lin Landsman, MD;  Location: [facility];  Service: Endoscopy;;   THORACOTOMY Right 03/29/2018   Procedure: THORACOTOMY MAJOR- POSSIBLE LOBECTOMY;  Surgeon: Nestor Lewandowsky, MD;  Location: [facility];  Service: Thoracic;  Laterality: Right;   TUBAL LIGATION     VIDEO ASSISTED THORACOSCOPY (VATS)/WEDGE RESECTION Right 03/29/2018   Procedure: VIDEO ASSISTED THORACOSCOPY (VATS)/WEDGE RESECTION;  Surgeon: Nestor Lewandowsky, MD;  Location: [facility];  Service: Thoracic;  Laterality: Right;    Family History  Problem Relation Age of Onset   Stroke Mother 32       massive stroke   Heart disease Father 84       congestive heart failure   Hypertension Father    Heart disease Brother 67       Suspected MI and SCD   Heart disease Sister    Stroke Sister 62  Breast cancer Maternal Grandmother 75   Breast cancer Cousin        x2 paternal, both alive    Social History   Tobacco Use   Smoking status: Former    Packs/day: 1.00    Years: 25.00    Pack years: 25.00    Types: Cigarettes    Quit date: 11/22/2015    Years since quitting: 5.6   Smokeless tobacco: Never  Vaping Use   Vaping Use: Some days   Substances: THC  Substance Use Topics   Alcohol use: Yes    Comment: rare   Drug use: Yes    Types: Marijuana    Comment: occ    No current facility-administered medications for this encounter.  Current Outpatient Medications:    ALPRAZolam (XANAX) 0.5 MG tablet, Take 1 tablet  (0.5 mg total) by mouth at bedtime as needed., Disp: 30 tablet, Rfl: 3   Aspirin-Caffeine (BC FAST PAIN RELIEF PO), Take 1 packet by mouth daily as needed (headache)., Disp: , Rfl:    Ferrous Sulfate (IRON PO), Take by mouth., Disp: , Rfl:    lisinopril (ZESTRIL) 10 MG tablet, Take 1/2-1 qd, Disp: 90 tablet, Rfl: 3   metroNIDAZOLE (FLAGYL) 500 MG tablet, Take 1 tablet (500 mg total) by mouth 2 (two) times daily for 7 days., Disp: 14 tablet, Rfl: 0   omeprazole (PRILOSEC) 20 MG capsule, Take 1 capsule (20 mg total) by mouth 2 (two) times daily before a meal., Disp: 60 capsule, Rfl: 0   sertraline (ZOLOFT) 100 MG tablet, Take 1.5 tablets (150 mg total) by mouth daily., Disp: 135 tablet, Rfl: 3   valACYclovir (VALTREX) 500 MG tablet, TAKE 2 TABLETS BY MOUTH TODAY, THEN 2 TABLETS TWICE DAILY AS NEEDED., Disp: 60 tablet, Rfl: 5  No Known Allergies   ROS  As noted in HPI.   Physical Exam  BP (!) 149/102 (BP Location: Left Arm)    Pulse 78    Temp 98.3 F (36.8 C) (Oral)    Resp 20    SpO2 100%   Constitutional: Well developed, well nourished, no acute distress Eyes:  EOMI, conjunctiva normal bilaterally HENT: Normocephalic, atraumatic,mucus membranes moist Respiratory: Normal inspiratory effort Cardiovascular: Normal rate GI: nondistended soft, nontender. No suprapubic tenderness reports suprapubic "pressure" with palpation back: No CVA tenderness GU: Deferred due to patient request skin: No rash, skin intact Musculoskeletal: no deformities Neurologic: Alert & oriented x 3, no focal neuro deficits Psychiatric: Speech and behavior appropriate   ED Course   Medications - No data to display  Orders Placed This Encounter  Procedures   Wet prep, genital    Standing Status:   Standing    Number of Occurrences:   1   Urinalysis, Routine w reflex microscopic    Standing Status:   Standing    Number of Occurrences:   1   Pregnancy, urine    Standing Status:   Standing    Number of  Occurrences:   1   RPR    Standing Status:   Standing    Number of Occurrences:   1   HIV Antibody (routine testing w rflx)    Standing Status:   Standing    Number of Occurrences:   1    Results for orders placed or performed during the hospital encounter of 07/31/21 (from the past 24 hour(s))  Urinalysis, Routine w reflex microscopic     Status: None   Collection Time: 07/31/21  1:08 PM  Result  Value Ref Range   Color, Urine YELLOW YELLOW   APPearance CLEAR CLEAR   Specific Gravity, Urine 1.010 1.005 - 1.030   pH 6.0 5.0 - 8.0   Glucose, UA NEGATIVE NEGATIVE mg/dL   Hgb urine dipstick NEGATIVE NEGATIVE   Bilirubin Urine NEGATIVE NEGATIVE   Ketones, ur NEGATIVE NEGATIVE mg/dL   Protein, ur NEGATIVE NEGATIVE mg/dL   Nitrite NEGATIVE NEGATIVE   Leukocytes,Ua NEGATIVE NEGATIVE  Wet prep, genital     Status: Abnormal   Collection Time: 07/31/21  1:08 PM  Result Value Ref Range   Yeast Wet Prep HPF POC NONE SEEN NONE SEEN   Trich, Wet Prep NONE SEEN NONE SEEN   Clue Cells Wet Prep HPF POC PRESENT (A) NONE SEEN   WBC, Wet Prep HPF POC <10 (A) <10   Sperm NONE SEEN   Pregnancy, urine     Status: None   Collection Time: 07/31/21  1:19 PM  Result Value Ref Range   Preg Test, Ur NEGATIVE NEGATIVE   No results found.  ED Clinical Impression  1. BV (bacterial vaginosis)   2. Screening for STDs (sexually transmitted diseases)     ED Assessment/Plan  Patient concerned about STDs.  Vaginal swab, HIV, syphilis sent.  Patient will come back in and bring her partner in for treatment if any of her labs come back abnormal.  Wet prep was positive for BV.  Home with Flagyl.  She is attributing the light spotting and lower abdominal cramping to the beginning of menses.  Doubt PID.  Urine pregnancy negative.  She does not have a UTI.  Advised pt to refrain from sexual contact until she he knows lab results, symptoms resolve, and partner(s) are treated if necessary. Pt provided working phone  number. Follow-up with PMD as needed. Discussed labs, MDM, plan and followup with patient. Pt agrees with plan.   Meds ordered this encounter  Medications   metroNIDAZOLE (FLAGYL) 500 MG tablet    Sig: Take 1 tablet (500 mg total) by mouth 2 (two) times daily for 7 days.    Dispense:  14 tablet    Refill:  0    *This clinic note was created using [employer]. Therefore, there may be occasional mistakes despite careful proofreading.  ?     Melynda Ripple, MD 08/02/21 [phone]

## 2021-08-01 ENCOUNTER — Telehealth: Payer: Self-pay

## 2021-08-01 LAB — CERVICOVAGINAL ANCILLARY ONLY
Chlamydia: NEGATIVE
Comment: NEGATIVE
Comment: NORMAL
Neisseria Gonorrhea: NEGATIVE

## 2021-08-01 NOTE — Telephone Encounter (Signed)
Patient needs to reschedule EGD with Dr. Marius Ditch she left voicemail. Patient would like to be moved to 09/20/21. Called Trish and got patient moved

## 2021-09-11 ENCOUNTER — Ambulatory Visit: Payer: BC Managed Care – PPO | Admitting: Gastroenterology

## 2021-09-20 ENCOUNTER — Encounter
Admission: RE | Disposition: A | Discharge status: Home / Self Care | Payer: Self-pay | Source: Home / Self Care | Attending: Gastroenterology

## 2021-09-20 ENCOUNTER — Ambulatory Visit
Admission: RE | Admit: 2021-09-20 | Discharge: 2021-09-20 | Disposition: A | Discharge status: Home / Self Care | Payer: BC Managed Care – PPO | Attending: Gastroenterology | Admitting: Gastroenterology

## 2021-09-20 ENCOUNTER — Other Ambulatory Visit: Payer: Self-pay

## 2021-09-20 ENCOUNTER — Ambulatory Visit: Payer: BC Managed Care – PPO | Admitting: Anesthesiology

## 2021-09-20 ENCOUNTER — Encounter: Payer: Self-pay | Admitting: Gastroenterology

## 2021-09-20 DIAGNOSIS — Z87891 Personal history of nicotine dependence: Secondary | ICD-10-CM | POA: Diagnosis not present

## 2021-09-20 DIAGNOSIS — F32A Depression, unspecified: Secondary | ICD-10-CM | POA: Insufficient documentation

## 2021-09-20 DIAGNOSIS — Z13810 Encounter for screening for upper gastrointestinal disorder: Secondary | ICD-10-CM | POA: Insufficient documentation

## 2021-09-20 DIAGNOSIS — G4733 Obstructive sleep apnea (adult) (pediatric): Secondary | ICD-10-CM | POA: Diagnosis not present

## 2021-09-20 DIAGNOSIS — F419 Anxiety disorder, unspecified: Secondary | ICD-10-CM | POA: Diagnosis not present

## 2021-09-20 DIAGNOSIS — K449 Diaphragmatic hernia without obstruction or gangrene: Secondary | ICD-10-CM

## 2021-09-20 DIAGNOSIS — D509 Iron deficiency anemia, unspecified: Secondary | ICD-10-CM | POA: Diagnosis not present

## 2021-09-20 DIAGNOSIS — K2100 Gastro-esophageal reflux disease with esophagitis, without bleeding: Secondary | ICD-10-CM | POA: Diagnosis not present

## 2021-09-20 DIAGNOSIS — K219 Gastro-esophageal reflux disease without esophagitis: Secondary | ICD-10-CM

## 2021-09-20 DIAGNOSIS — Z85118 Personal history of other malignant neoplasm of bronchus and lung: Secondary | ICD-10-CM | POA: Diagnosis not present

## 2021-09-20 DIAGNOSIS — I10 Essential (primary) hypertension: Secondary | ICD-10-CM | POA: Diagnosis not present

## 2021-09-20 HISTORY — PX: ESOPHAGOGASTRODUODENOSCOPY (EGD) WITH PROPOFOL: SHX5813

## 2021-09-20 LAB — POCT PREGNANCY, URINE: Preg Test, Ur: NEGATIVE

## 2021-09-20 SURGERY — ESOPHAGOGASTRODUODENOSCOPY (EGD) WITH PROPOFOL
Anesthesia: General

## 2021-09-20 MED ORDER — PROPOFOL 10 MG/ML IV BOLUS
INTRAVENOUS | Status: DC | PRN
Start: 2021-09-20 — End: 2021-09-20
  Administered 2021-09-20: 20 mg via INTRAVENOUS
  Administered 2021-09-20: 70 mg via INTRAVENOUS
  Administered 2021-09-20: 10 mg via INTRAVENOUS

## 2021-09-20 MED ORDER — LIDOCAINE HCL (PF) 2 % IJ SOLN
INTRAMUSCULAR | Status: AC
  Filled 2021-09-20: qty 5

## 2021-09-20 MED ORDER — SODIUM CHLORIDE 0.9 % IV SOLN: INTRAVENOUS | Status: DC

## 2021-09-20 MED ORDER — LIDOCAINE HCL (CARDIAC) PF 100 MG/5ML IV SOSY
PREFILLED_SYRINGE | INTRAVENOUS | Status: DC | PRN
Start: 2021-09-20 — End: 2021-09-20
  Administered 2021-09-20: 50 mg via INTRAVENOUS

## 2021-09-20 MED ORDER — PROPOFOL 500 MG/50ML IV EMUL
INTRAVENOUS | Status: DC | PRN
  Administered 2021-09-20: 125 ug/kg/min via INTRAVENOUS

## 2021-09-20 MED ORDER — PROPOFOL 500 MG/50ML IV EMUL
INTRAVENOUS | Status: AC
  Filled 2021-09-20: qty 50

## 2021-09-20 NOTE — Op Note (Signed)
[facility] ?Gastroenterology ?Patient Name: Catherine Gilbert ?Procedure Date: 09/20/2021 7:12 AM ?MRN: [id number] ?Account #: [id number] ?Date of Birth: December 28, 1975 ?Admit Type: Outpatient ?Age: 46 ?Room: [facility] ENDO ROOM 2 ?Gender: Female ?Note Status: Finalized ?Instrument Name: Upper Endoscope [id number] ?Procedure:             Upper GI endoscopy ?Indications:           Screening for Barrett's esophagus in patient at risk  ?                       for this condition, Unexplained iron deficiency  ?                       anemia, Follow-up of gastro-esophageal reflux disease ?Providers:             Lin Landsman MD, MD ?Referring MD:          Jobe Marker. Einar Pheasant (Referring MD) ?Medicines:             General Anesthesia ?Complications:         No immediate complications. Estimated blood loss: None. ?Procedure:             Pre-Anesthesia Assessment: ?                       - Prior to the procedure, a History and Physical was  ?                       performed, and patient medications and allergies were  ?                       reviewed. The patient is competent. The risks and  ?                       benefits of the procedure and the sedation options and  ?                       risks were discussed with the patient. All questions  ?                       were answered and informed consent was obtained.  ?                       Patient identification and proposed procedure were  ?                       verified by the physician, the nurse, the  ?                       anesthesiologist, the anesthetist and the technician  ?                       in the pre-procedure area in the procedure room in the  ?                       endoscopy suite. Mental Status Examination: alert and  ?                       oriented. Airway Examination: normal oropharyngeal  ?  airway and neck mobility. Respiratory Examination:  ?                       clear to auscultation. CV Examination: normal.  ?                        Prophylactic Antibiotics: The patient does not require  ?                       prophylactic antibiotics. Prior Anticoagulants: The  ?                       patient has taken no previous anticoagulant or  ?                       antiplatelet agents. ASA Grade Assessment: II - A  ?                       patient with mild systemic disease. After reviewing  ?                       the risks and benefits, the patient was deemed in  ?                       satisfactory condition to undergo the procedure. The  ?                       anesthesia plan was to use general anesthesia.  ?                       Immediately prior to administration of medications,  ?                       the patient was re-assessed for adequacy to receive  ?                       sedatives. The heart rate, respiratory rate, oxygen  ?                       saturations, blood pressure, adequacy of pulmonary  ?                       ventilation, and response to care were monitored  ?                       throughout the procedure. The physical status of the  ?                       patient was re-assessed after the procedure. ?                       After obtaining informed consent, the endoscope was  ?                       passed under direct vision. Throughout the procedure,  ?                       the patient's blood pressure, pulse, and oxygen  ?  saturations were monitored continuously. The Endoscope  ?                       was introduced through the mouth, and advanced to the  ?                       second part of duodenum. The upper GI endoscopy was  ?                       accomplished without difficulty. The patient tolerated  ?                       the procedure well. ?Findings: ?     The duodenal bulb and second portion of the duodenum were normal. ?     The entire examined stomach was normal. Biopsies were taken with a cold  ?     forceps for histology. ?     A small hiatal hernia was present. ?      Esophagogastric landmarks were identified: the gastroesophageal junction  ?     was found at 35 cm from the incisors. ?     The gastroesophageal junction and examined esophagus were normal. ?Impression:            - Normal duodenal bulb and second portion of the  ?                       duodenum. ?                       - Normal stomach. Biopsied. ?                       - Small hiatal hernia. ?                       - Esophagogastric landmarks identified. ?                       - Normal gastroesophageal junction and esophagus. ?Recommendation:        - Await pathology results. ?                       - Discharge patient to home (with escort). ?                       - Resume previous diet today. ?                       - Continue present medications. ?                       - Follow an antireflux regimen indefinitely. ?Procedure Code(s):     --- Professional --- ?                       [phone], Esophagogastroduodenoscopy, flexible,  ?                       transoral; with biopsy, single or multiple ?Diagnosis Code(s):     --- Professional --- ?  K44.9, Diaphragmatic hernia without obstruction or  ?                       gangrene ?                       Z13.810, Encounter for screening for upper  ?                       gastrointestinal disorder ?                       K21.9, Gastro-esophageal reflux disease without  ?                       esophagitis ?                       D50.9, Iron deficiency anemia, unspecified ?CPT copyright 2019 American Medical Association. All rights reserved. ?The codes documented in this report are preliminary and upon coder review may  ?be revised to meet current compliance requirements. ?Dr. Ulyess Mort ?Amanat Hackel Raeanne Gathers MD, MD ?09/20/2021 8:07:03 AM ?This report has been signed electronically. ?Number of Addenda: 0 ?Note Initiated On: 09/20/2021 7:12 AM ?Estimated Blood Loss:  Estimated blood loss: none. ?     [facility] ?

## 2021-09-20 NOTE — Transfer of Care (Signed)
Immediate Anesthesia Transfer of Care Note ? ?Patient: Catherine Gilbert ? ?Procedure(s) Performed: ESOPHAGOGASTRODUODENOSCOPY (EGD) WITH PROPOFOL ? ?Patient Location: PACU ? ?Anesthesia Type:General ? ?Level of Consciousness: awake, drowsy and patient cooperative ? ?Airway & Oxygen Therapy: Patient Spontanous Breathing ? ?Post-op Assessment: Report given to RN and Post -op Vital signs reviewed and stable ? ?Post vital signs: Reviewed and stable ? ?Last Vitals:  ?Vitals Value Taken Time  ?BP 95/53 09/20/21 0809  ?Temp    ?Pulse 62 09/20/21 0809  ?Resp 13 09/20/21 0809  ?SpO2 98 % 09/20/21 0809  ? ? ?Last Pain:  ?Vitals:  ? 09/20/21 0719  ?TempSrc: Temporal  ?PainSc: 0-No pain  ?   ? ?  ? ?Complications: No notable events documented. ?

## 2021-09-20 NOTE — Anesthesia Preprocedure Evaluation (Addendum)
Anesthesia Evaluation  ?Patient identified by MRN, date of birth, ID band ?Patient awake ? ? ? ?Reviewed: ?Allergy & Precautions, NPO status , Patient's Chart, lab work & pertinent test results ? ?History of Anesthesia Complications ?Negative for: history of anesthetic complications ? ?Airway ?Mallampati: II ? ? ?Neck ROM: Full ? ? ? Dental ? ? ?Missing lower molar:   ?Pulmonary ?sleep apnea , Patient abstained from smoking., former smoker (quit 2017),  ?Lung CA s/p wedge resection ?  ?Pulmonary exam normal ?breath sounds clear to auscultation ? ? ? ? ? ? Cardiovascular ?hypertension, Normal cardiovascular exam ?Rhythm:Regular Rate:Normal ? ?ECG 12/27/20:  ?Sinus  Rhythm  -Short PR syndrome  ?PRi = 118 ? ?Echo 08/2019: ?1. Left ventricular ejection fraction, by estimation, is 50 to 55%. The left ventricle has low normal function. Left ventricular endocardial border not optimally defined to evaluate regional wall motion. Left ventricular diastolic parameters were normal.  ??2. Right ventricular systolic function is normal. The right ventricular size is normal.  ??3. The mitral valve is grossly normal. No evidence of mitral valve regurgitation.  ??4. The aortic valve is normal in structure and function. Aortic valve regurgitation is not visualized.  ??5. Pulmonic valve regurgitation not assessed.  ??6. The inferior vena cava is normal in size with greater than 50% respiratory variability, suggesting right atrial pressure of 3 mmHg. ?? ?Zio patch 07/2020: ?- The patient was monitored for 14 days. ?- The predominant rhythm was sinus with an average rate of 84 bpm (range 54-154 bpm). ?- There were rare PACs and PVCs. Episodes of ventricular bigeminy and trigeminy were noted. ?- There was no sustained arrhythmia or prolonged pause. ?- Patient triggered events correspond to sinus rhythm with PVCs. ?? ?Predominately sinus rhythm with rare PACs and PVCs. ?  ?Neuro/Psych ? Headaches, PSYCHIATRIC  DISORDERS Anxiety Depression   ? GI/Hepatic ?GERD  ,  ?Endo/Other  ?negative endocrine ROS ? Renal/GU ?negative Renal ROS  ? ?  ?Musculoskeletal ? ? Abdominal ?  ?Peds ? Hematology ? ?(+) Blood dyscrasia, anemia ,   ?Anesthesia Other Findings ?Cardiology note 10/12/20:  ?1. Exertional dyspnea/chest pain: Not currently active.  Cardiac work-up including ETT, calcium score, and echo have been unrevealing.  Prior chest discomfort has been felt to be atypical related to prior right-sided thoracotomy site.  Continue to encourage weight loss and increase activity level. ?? ?2. HTN: Blood pressure is well controlled in the office today.  She remains on lisinopril. ?? ?3. Palpitations/PVCs: Palpitations continue to be symptomatic though PVC burden is rare.  She will let us know if symptoms become frequent enough that she would want symptomatic treatment.  If symptoms do become more frequent we could consider transitioning her lisinopril to metoprolol or carvedilol in an effort to manage blood pressure and symptomatic palpitation burden.  Limit caffeine use.  No further work-up indicated at this time. ?? ?4. OSA: Noted on recent sleep study.  She has been referred to ENT by pulmonology for consideration of oral appliance to assist in management of this. ? Reproductive/Obstetrics ? ?  ? ? ? ? ? ? ? ? ? ? ? ? ? ?  ?  ? ? ? ? ? ? ? ?Anesthesia Physical ?Anesthesia Plan ? ?ASA: 2 ? ?Anesthesia Plan: General  ? ?Post-op Pain Management:   ? ?Induction: Intravenous ? ?PONV Risk Score and Plan: 3 and Propofol infusion, TIVA and Treatment may vary due to age or medical condition ? ?Airway Management Planned: Natural Airway ? ?Additional Equipment:  ? ?  Intra-op Plan:  ? ?Post-operative Plan:  ? ?Informed Consent: I have reviewed the patients History and Physical, chart, labs and discussed the procedure including the risks, benefits and alternatives for the proposed anesthesia with the patient or authorized representative who has  indicated his/her understanding and acceptance.  ? ? ? ? ? ?Plan Discussed with: CRNA ? ?Anesthesia Plan Comments: (LMA/GETA backup discussed.  Patient consented for risks of anesthesia including but not limited to:  ?- adverse reactions to medications ?- damage to eyes, teeth, lips or other oral mucosa ?- nerve damage due to positioning  ?- sore throat or hoarseness ?- damage to heart, brain, nerves, lungs, other parts of body or loss of life ? ?Informed patient about role of CRNA in peri- and intra-operative care.  Patient voiced understanding.)  ? ? ? ? ? ? ?Anesthesia Quick Evaluation ? ?

## 2021-09-20 NOTE — H&P (Signed)
?Catherine Darby, MD ?[address]  ?[address]  ?[place], [place] [address]  ?Main: [phone]  ?Fax: [phone] ?Pager: [phone] ? ?Primary Care Physician:  Lesleigh Noe, MD ?Primary Gastroenterologist:  Dr. Cephas Gilbert ? ?Pre-Procedure History & Physical: ?HPI:  Catherine Gilbert is a 46 y.o. female is here for an endoscopy. ?  ?Past Medical History:  ?Diagnosis Date  ? Abnormal Pap smear   ? Anemia   ? Anxiety   ? Anxiety state 04/23/2011  ? Cancer ([place]) 02/2018  ? lung rt  ? Depression   ? Dysmenorrhea 08/27/2011  ? Dyspnea on exertion 10/30/2015  ? Essential hypertension 09/15/2012  ? Family history of ASCVD (arteriosclerotic cardiovascular disease) 12/31/2016  ? Family history of premature CAD 11/11/2016  ? GAD (generalized anxiety disorder) 10/30/2015  ? GERD (gastroesophageal reflux disease) 05/29/2014  ? Headache 01/01/2017  ? HSV (herpes simplex virus) infection   ? Hypertension   ? Initiation of Depo Provera 09/24/2011  ? Lung nodule   ? RLL on coronary calcium score CT  ? Migraine headache   ? ? ?Past Surgical History:  ?Procedure Laterality Date  ? BREAST CYST ASPIRATION Right 02/10/2018  ? neg  ? CESAREAN SECTION    ? x 3   ? COLONOSCOPY WITH PROPOFOL N/A 04/25/2021  ? Procedure: COLONOSCOPY WITH PROPOFOL;  Surgeon: Lin Landsman, MD;  Location: [facility];  Service: Endoscopy;  Laterality: N/A;  ? COLPOSCOPY  2010  ? DILATION AND CURETTAGE OF UTERUS    ? FLEXIBLE BRONCHOSCOPY N/A 03/29/2018  ? Procedure: PREOP  BRONCHOSCOPY;  Surgeon: Nestor Lewandowsky, MD;  Location: [facility];  Service: Thoracic;  Laterality: N/A;  ? HYSTEROSCOPY WITH D & C N/A 01/12/2020  ? Procedure: DILATATION AND CURETTAGE /HYSTEROSCOPY;  Surgeon: Homero Fellers, MD;  Location: [facility];  Service: Gynecology;  Laterality: N/A;  ? POLYPECTOMY  04/25/2021  ? Procedure: POLYPECTOMY;  Surgeon: Lin Landsman, MD;  Location: [facility];  Service: Endoscopy;;  ? THORACOTOMY Right 03/29/2018  ? Procedure:  THORACOTOMY MAJOR- POSSIBLE LOBECTOMY;  Surgeon: Nestor Lewandowsky, MD;  Location: [facility];  Service: Thoracic;  Laterality: Right;  ? TUBAL LIGATION    ? VIDEO ASSISTED THORACOSCOPY (VATS)/WEDGE RESECTION Right 03/29/2018  ? Procedure: VIDEO ASSISTED THORACOSCOPY (VATS)/WEDGE RESECTION;  Surgeon: Nestor Lewandowsky, MD;  Location: [facility];  Service: Thoracic;  Laterality: Right;  ? ? ?Prior to Admission medications   ?Medication Sig Start Date End Date Taking? Authorizing Provider  ?ALPRAZolam (XANAX) 0.5 MG tablet Take 1 tablet (0.5 mg total) by mouth at bedtime as needed. 03/25/21  Yes Lesleigh Noe, MD  ?Aspirin-Caffeine [facility] FAST PAIN RELIEF PO) Take 1 packet by mouth daily as needed (headache).   Yes [provider]  ?Ferrous Sulfate (IRON PO) Take by mouth.   Yes [provider]  ?lisinopril (ZESTRIL) 10 MG tablet Take 1/2-1 qd 09/20/20  Yes Lesleigh Noe, MD  ?omeprazole (PRILOSEC) 20 MG capsule Take 1 capsule (20 mg total) by mouth 2 (two) times daily before a meal. 05/15/21 09/20/21 Yes Chastidy Ranker, Tally Due, MD  ?sertraline (ZOLOFT) 100 MG tablet Take 1.5 tablets (150 mg total) by mouth daily. 03/25/21  Yes Lesleigh Noe, MD  ?valACYclovir (VALTREX) 500 MG tablet TAKE 2 TABLETS BY MOUTH TODAY, THEN 2 TABLETS TWICE DAILY AS NEEDED. 07/09/21  Yes Lesleigh Noe, MD  ? ? ?Allergies as of 05/15/2021  ? (No Known Allergies)  ? ? ?Family History  ?Problem  Relation Age of Onset  ? Stroke Mother 56  ?     massive stroke  ? Heart disease Father 36  ?     congestive heart failure  ? Hypertension Father   ? Heart disease Brother 63  ?     Suspected MI and SCD  ? Heart disease Sister   ? Stroke Sister 72  ? Breast cancer Maternal Grandmother 74  ? Breast cancer Cousin   ?     x2 paternal, both alive  ? ? ?Social History  ? ?Socioeconomic History  ? Marital status: Married  ?  Spouse name: Francee Piccolo  ? Number of children: 3  ? Years of education: High school  ? Highest education level: Not on file  ?Occupational  History  ? Not on file  ?Tobacco Use  ? Smoking status: Former  ?  Packs/day: 1.00  ?  Years: 25.00  ?  Pack years: 25.00  ?  Types: Cigarettes  ?  Quit date: 11/22/2015  ?  Years since quitting: 5.8  ? Smokeless tobacco: Never  ?Vaping Use  ? Vaping Use: Some days  ? Substances: THC  ?Substance and Sexual Activity  ? Alcohol use: Yes  ?  Comment: rare  ? Drug use: Yes  ?  Types: Marijuana  ?  Comment: occ  ? Sexual activity: Yes  ?  Birth control/protection: Surgical  ?  Comment: tubal ligation  ?Other Topics Concern  ? Not on file  ?Social History Narrative  ? 10/13/19  ? From: the area  ? Living: Husband, Francee Piccolo (2000) and 3 children  ? 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   ? Family: not close with family that are living, but good relationship with children  ?   ? Enjoys: binge watch TV, crystals - metaphysical studies  ?   ? Exercise: not currently  ? Diet: pretty good, lunch - salads, home cooking most of the time  ?   ? Safety  ? Seat belts: Yes   ? Guns: Yes  and secure  ? Safe in relationships: Yes   ? ?Social Determinants of Health  ? ?Financial Resource Strain: Not on file  ?Food Insecurity: Not on file  ?Transportation Needs: Not on file  ?Physical Activity: Not on file  ?Stress: Not on file  ?Social Connections: Not on file  ?Intimate Partner Violence: Not on file  ? ? ?Review of Systems: ?See HPI, otherwise negative ROS ? ?Physical Exam: ?BP (!) 121/92   Pulse 75   Temp (!) 96.6 ?F (35.9 ?C) (Temporal)   Resp 20   Ht _0  (1.626 m)   Wt 68 kg   SpO2 99%   BMI 25.75 kg/m?  ?General:   Alert,  pleasant and cooperative in NAD ?Head:  Normocephalic and atraumatic. ?Neck:  Supple; no masses or thyromegaly. ?Lungs:  Clear throughout to auscultation.    ?Heart:  Regular rate and rhythm. ?Abdomen:  Soft, nontender and nondistended. Normal bowel sounds, without guarding, and without rebound.   ?Neurologic:  Alert and  oriented x4;  grossly normal  neurologically. ? ?Impression/Plan: ?Catherine Gilbert is here for an endoscopy to be performed for chronic GERD ? ?Risks, benefits, limitations, and alternatives regarding  endoscopy have been reviewed with the patient.  Questions have been answered.  All parties agreeable. ? ? ?Sherri Sear, MD  09/20/2021, 7:50 AM ?

## 2021-09-20 NOTE — Anesthesia Postprocedure Evaluation (Signed)
Anesthesia Post Note ? ?Patient: Catherine Gilbert ? ?Procedure(s) Performed: ESOPHAGOGASTRODUODENOSCOPY (EGD) WITH PROPOFOL ? ?Patient location during evaluation: PACU ?Anesthesia Type: General ?Level of consciousness: awake and alert, oriented and patient cooperative ?Pain management: pain level controlled ?Vital Signs Assessment: post-procedure vital signs reviewed and stable ?Respiratory status: spontaneous breathing, nonlabored ventilation and respiratory function stable ?Cardiovascular status: blood pressure returned to baseline and stable ?Postop Assessment: adequate PO intake ?Anesthetic complications: no ? ? ?No notable events documented. ? ? ?Last Vitals:  ?Vitals:  ? 09/20/21 0719 09/20/21 0809  ?BP: (!) 121/92 (!) 95/53  ?Pulse: 75 63  ?Resp: 20 13  ?Temp: (!) 35.9 ?C (!) 36 ?C  ?SpO2: 99% 98%  ?  ?Last Pain:  ?Vitals:  ? 09/20/21 0829  ?TempSrc:   ?PainSc: 0-No pain  ? ? ?  ?  ?  ?  ?  ?  ? ?Darrin Nipper ? ? ? ? ?

## 2021-09-23 ENCOUNTER — Encounter: Payer: Self-pay | Admitting: Gastroenterology

## 2021-09-23 LAB — SURGICAL PATHOLOGY

## 2021-09-24 ENCOUNTER — Other Ambulatory Visit: Payer: Self-pay | Admitting: Gastroenterology

## 2021-09-24 ENCOUNTER — Encounter: Payer: Self-pay | Admitting: Family Medicine

## 2021-09-24 DIAGNOSIS — K219 Gastro-esophageal reflux disease without esophagitis: Secondary | ICD-10-CM

## 2021-09-25 MED ORDER — OMEPRAZOLE 20 MG PO CPDR: 20.0000 mg | DELAYED_RELEASE_CAPSULE | Freq: Two times a day (BID) | ORAL | 1 refills | Status: DC

## 2021-09-26 ENCOUNTER — Other Ambulatory Visit: Payer: Self-pay

## 2021-09-26 DIAGNOSIS — F411 Generalized anxiety disorder: Secondary | ICD-10-CM

## 2021-09-26 MED ORDER — LISINOPRIL 10 MG PO TABS: ORAL_TABLET | ORAL | 0 refills | Status: DC

## 2021-09-26 MED ORDER — SERTRALINE HCL 100 MG PO TABS: 150.0000 mg | ORAL_TABLET | Freq: Every day | ORAL | 1 refills | Status: DC

## 2021-10-01 ENCOUNTER — Telehealth: Payer: Self-pay | Admitting: Internal Medicine

## 2021-10-01 NOTE — Telephone Encounter (Signed)
Patient declined recall .  Wants to defer to pcp and fu here prn.  Deleting recall.  ?

## 2021-10-10 ENCOUNTER — Ambulatory Visit: Payer: BC Managed Care – PPO | Admitting: Family Medicine

## 2021-10-10 VITALS — BP 110/62 | HR 77 | Temp 98.2°F | Ht 64.0 in | Wt 158.2 lb

## 2021-10-10 DIAGNOSIS — F409 Phobic anxiety disorder, unspecified: Secondary | ICD-10-CM

## 2021-10-10 DIAGNOSIS — F411 Generalized anxiety disorder: Secondary | ICD-10-CM | POA: Diagnosis not present

## 2021-10-10 DIAGNOSIS — N898 Other specified noninflammatory disorders of vagina: Secondary | ICD-10-CM

## 2021-10-10 DIAGNOSIS — F5105 Insomnia due to other mental disorder: Secondary | ICD-10-CM

## 2021-10-10 MED ORDER — ALPRAZOLAM 0.5 MG PO TABS: 0.5000 mg | ORAL_TABLET | Freq: Every evening | ORAL | 1 refills | Status: DC | PRN

## 2021-10-10 NOTE — Progress Notes (Signed)
we ? ?Subjective:  ? ?  ?Catherine Gilbert is a 46 y.o. female presenting for Medication Refill (xanax) and Vaginal Discharge (With odor after being on an antibiotic) ?  ? ? ?HPI ? ?#Vaginal discharge ?- recently on abx ?- had a UTI and was treated for this ?- had BV and was diagnosed and treated for this ?- is back with her partner ?- did get tested for STI ?- completed course of abx - metronidazole  ?- bad odor ?- eating yogurt ?- itchy ?- discharge - clear ? ?#anxiety ?- separated from husband - February - started trying to work on her relationship ?- DSS - now is taking care of her 6 yo great niece and 72 yo great nephew  ?- husband in therapy ?- she went therapy when she was separated ?- he has really made some changes ?- has been taking aswaghandha again ?- also taking zoloft 150 mg ? ?Review of Systems ? ? ?Social History  ? ?Tobacco Use  ?Smoking Status Former  ? Packs/day: 1.00  ? Years: 25.00  ? Pack years: 25.00  ? Types: Cigarettes  ? Quit date: 11/22/2015  ? Years since quitting: 5.8  ?Smokeless Tobacco Never  ? ? ? ?   ?Objective:  ?  ?BP Readings from Last 3 Encounters:  ?10/10/21 110/62  ?09/20/21 (!) 95/53  ?07/31/21 (!) 149/102  ? ?Wt Readings from Last 3 Encounters:  ?10/10/21 158 lb 3 oz (71.8 kg)  ?09/20/21 150 lb (68 kg)  ?05/15/21 151 lb 12.8 oz (68.9 kg)  ? ? ?BP 110/62   Pulse 77   Temp 98.2 ?F (36.8 ?C) (Oral)   Ht 5\' 4"  (1.626 m)   Wt 158 lb 3 oz (71.8 kg)   LMP 09/24/2021 (Exact Date)   SpO2 97%   BMI 27.15 kg/m?  ? ? ?Physical Exam ?Constitutional:   ?   General: She is not in acute distress. ?   Appearance: She is well-developed. She is not diaphoretic.  ?HENT:  ?   Right Ear: External ear normal.  ?   Left Ear: External ear normal.  ?   Nose: Nose normal.  ?Eyes:  ?   Conjunctiva/sclera: Conjunctivae normal.  ?Cardiovascular:  ?   Rate and Rhythm: Normal rate.  ?Pulmonary:  ?   Effort: Pulmonary effort is normal.  ?Musculoskeletal:  ?   Cervical back: Neck supple.  ?Skin: ?    General: Skin is warm and dry.  ?   Capillary Refill: Capillary refill takes less than 2 seconds.  ?Neurological:  ?   Mental Status: She is alert. Mental status is at baseline.  ?Psychiatric:     ?   Mood and Affect: Mood normal.     ?   Behavior: Behavior normal.  ? ? ? ? ? ?   ?Assessment & Plan:  ? ?Problem List Items Addressed This Visit   ? ?  ? Other  ? GAD (generalized anxiety disorder)  ?  Slightly worse setting of increased home stress.  Continue Zoloft and ashwagandha. Refill of prn xanax. Discussed if needing more frequently to f/u to discuss medication adjustment.  ?  ?  ? Relevant Medications  ? ALPRAZolam (XANAX) 0.5 MG tablet  ? Insomnia due to anxiety and fear  ? Relevant Medications  ? ALPRAZolam (XANAX) 0.5 MG tablet  ? ?Other Visit Diagnoses   ? ? Vaginal discharge    -  Primary  ? Relevant Orders  ? WET PREP BY MOLECULAR PROBE  ? ?  ? ?  Recent BV, status post treatment.  Return of similar discharge recheck today. ? ?Return in about 6 months (around 04/11/2022) for annual. ? ?Lesleigh Noe, MD ? ? ? ?

## 2021-10-10 NOTE — Assessment & Plan Note (Addendum)
Slightly worse setting of increased home stress.  Continue Zoloft and ashwagandha. Refill of prn xanax. Discussed if needing more frequently to f/u to discuss medication adjustment.  ?

## 2021-10-11 ENCOUNTER — Other Ambulatory Visit: Payer: Self-pay | Admitting: Family Medicine

## 2021-10-11 DIAGNOSIS — B9689 Other specified bacterial agents as the cause of diseases classified elsewhere: Secondary | ICD-10-CM

## 2021-10-11 LAB — WET PREP BY MOLECULAR PROBE
Candida species: NOT DETECTED
MICRO NUMBER:: 13259840
SPECIMEN QUALITY:: ADEQUATE
Trichomonas vaginosis: NOT DETECTED

## 2021-10-11 MED ORDER — METRONIDAZOLE 500 MG PO TABS: 500.0000 mg | ORAL_TABLET | Freq: Two times a day (BID) | ORAL | 0 refills | Status: AC

## 2021-11-20 ENCOUNTER — Ambulatory Visit: Payer: BC Managed Care – PPO | Admitting: Gastroenterology

## 2021-12-16 ENCOUNTER — Ambulatory Visit
Admission: RE | Admit: 2021-12-16 | Discharge: 2021-12-16 | Disposition: A | Discharge status: Home / Self Care | Payer: BC Managed Care – PPO | Source: Ambulatory Visit | Attending: Family Medicine | Admitting: Family Medicine

## 2021-12-16 ENCOUNTER — Other Ambulatory Visit: Payer: Self-pay | Admitting: Family Medicine

## 2021-12-16 DIAGNOSIS — Z1231 Encounter for screening mammogram for malignant neoplasm of breast: Secondary | ICD-10-CM

## 2021-12-19 ENCOUNTER — Other Ambulatory Visit: Payer: Self-pay | Admitting: Family Medicine

## 2021-12-19 DIAGNOSIS — R921 Mammographic calcification found on diagnostic imaging of breast: Secondary | ICD-10-CM

## 2021-12-19 DIAGNOSIS — R928 Other abnormal and inconclusive findings on diagnostic imaging of breast: Secondary | ICD-10-CM

## 2021-12-21 ENCOUNTER — Other Ambulatory Visit: Payer: Self-pay | Admitting: Family Medicine

## 2021-12-25 ENCOUNTER — Ambulatory Visit
Admission: RE | Admit: 2021-12-25 | Discharge: 2021-12-25 | Disposition: A | Discharge status: Home / Self Care | Payer: BC Managed Care – PPO | Source: Ambulatory Visit | Attending: Family Medicine | Admitting: Family Medicine

## 2021-12-25 DIAGNOSIS — R921 Mammographic calcification found on diagnostic imaging of breast: Secondary | ICD-10-CM | POA: Insufficient documentation

## 2021-12-25 DIAGNOSIS — R928 Other abnormal and inconclusive findings on diagnostic imaging of breast: Secondary | ICD-10-CM | POA: Diagnosis not present

## 2022-01-27 ENCOUNTER — Other Ambulatory Visit: Payer: Self-pay | Admitting: Gastroenterology

## 2022-01-27 DIAGNOSIS — K219 Gastro-esophageal reflux disease without esophagitis: Secondary | ICD-10-CM

## 2022-02-17 ENCOUNTER — Other Ambulatory Visit: Payer: Self-pay | Admitting: Family Medicine

## 2022-02-17 DIAGNOSIS — F411 Generalized anxiety disorder: Secondary | ICD-10-CM

## 2022-03-20 ENCOUNTER — Other Ambulatory Visit: Payer: Self-pay | Admitting: Gastroenterology

## 2022-03-20 DIAGNOSIS — K219 Gastro-esophageal reflux disease without esophagitis: Secondary | ICD-10-CM

## 2022-03-20 MED ORDER — OMEPRAZOLE 20 MG PO CPDR: 20.0000 mg | DELAYED_RELEASE_CAPSULE | Freq: Two times a day (BID) | ORAL | 0 refills | Status: DC

## 2022-03-25 ENCOUNTER — Ambulatory Visit: Payer: BC Managed Care – PPO | Admitting: Gastroenterology

## 2022-04-22 ENCOUNTER — Other Ambulatory Visit: Payer: Self-pay

## 2022-04-22 MED ORDER — LISINOPRIL 10 MG PO TABS: ORAL_TABLET | ORAL | 0 refills | Status: DC

## 2022-05-02 ENCOUNTER — Encounter: Payer: Self-pay | Admitting: Internal Medicine

## 2022-05-02 ENCOUNTER — Inpatient Hospital Stay: Payer: BC Managed Care – PPO | Attending: Internal Medicine

## 2022-05-02 ENCOUNTER — Inpatient Hospital Stay (HOSPITAL_BASED_OUTPATIENT_CLINIC_OR_DEPARTMENT_OTHER): Payer: BC Managed Care – PPO | Admitting: Internal Medicine

## 2022-05-02 VITALS — BP 132/92 | HR 92 | Temp 96.3°F | Resp 19 | Wt 169.3 lb

## 2022-05-02 DIAGNOSIS — F411 Generalized anxiety disorder: Secondary | ICD-10-CM | POA: Insufficient documentation

## 2022-05-02 DIAGNOSIS — Z87891 Personal history of nicotine dependence: Secondary | ICD-10-CM | POA: Insufficient documentation

## 2022-05-02 DIAGNOSIS — C3431 Malignant neoplasm of lower lobe, right bronchus or lung: Secondary | ICD-10-CM

## 2022-05-02 DIAGNOSIS — E559 Vitamin D deficiency, unspecified: Secondary | ICD-10-CM | POA: Diagnosis not present

## 2022-05-02 DIAGNOSIS — Z823 Family history of stroke: Secondary | ICD-10-CM | POA: Insufficient documentation

## 2022-05-02 DIAGNOSIS — Z803 Family history of malignant neoplasm of breast: Secondary | ICD-10-CM | POA: Diagnosis not present

## 2022-05-02 DIAGNOSIS — Z8249 Family history of ischemic heart disease and other diseases of the circulatory system: Secondary | ICD-10-CM | POA: Insufficient documentation

## 2022-05-02 DIAGNOSIS — K219 Gastro-esophageal reflux disease without esophagitis: Secondary | ICD-10-CM | POA: Insufficient documentation

## 2022-05-02 DIAGNOSIS — Z79899 Other long term (current) drug therapy: Secondary | ICD-10-CM | POA: Insufficient documentation

## 2022-05-02 LAB — CBC WITH DIFFERENTIAL/PLATELET
Abs Immature Granulocytes: 0.04 10*3/uL (ref 0.00–0.07)
Basophils Absolute: 0 10*3/uL (ref 0.0–0.1)
Basophils Relative: 0 %
Eosinophils Absolute: 0.2 10*3/uL (ref 0.0–0.5)
Eosinophils Relative: 2 %
HCT: 38.5 % (ref 36.0–46.0)
Hemoglobin: 13 g/dL (ref 12.0–15.0)
Immature Granulocytes: 0 %
Lymphocytes Relative: 33 %
Lymphs Abs: 3.2 10*3/uL (ref 0.7–4.0)
MCH: 31 pg (ref 26.0–34.0)
MCHC: 33.8 g/dL (ref 30.0–36.0)
MCV: 91.9 fL (ref 80.0–100.0)
Monocytes Absolute: 0.9 10*3/uL (ref 0.1–1.0)
Monocytes Relative: 9 %
Neutro Abs: 5.3 10*3/uL (ref 1.7–7.7)
Neutrophils Relative %: 56 %
Platelets: 228 10*3/uL (ref 150–400)
RBC: 4.19 MIL/uL (ref 3.87–5.11)
RDW: 11.9 % (ref 11.5–15.5)
WBC: 9.6 10*3/uL (ref 4.0–10.5)
nRBC: 0 % (ref 0.0–0.2)

## 2022-05-02 LAB — COMPREHENSIVE METABOLIC PANEL
ALT: 21 U/L (ref 0–44)
AST: 22 U/L (ref 15–41)
Albumin: 4.5 g/dL (ref 3.5–5.0)
Alkaline Phosphatase: 40 U/L (ref 38–126)
Anion gap: 8 (ref 5–15)
BUN: 10 mg/dL (ref 6–20)
CO2: 27 mmol/L (ref 22–32)
Calcium: 9.2 mg/dL (ref 8.9–10.3)
Chloride: 104 mmol/L (ref 98–111)
Creatinine, Ser: 0.84 mg/dL (ref 0.44–1.00)
GFR, Estimated: >60 mL/min (ref >60)
Glucose, Bld: 105 mg/dL — ABNORMAL HIGH (ref 70–99)
Potassium: 4.4 mmol/L (ref 3.5–5.1)
Sodium: 139 mmol/L (ref 135–145)
Total Bilirubin: 0.5 mg/dL (ref 0.3–1.2)
Total Protein: 7.5 g/dL (ref 6.5–8.1)

## 2022-05-02 NOTE — Assessment & Plan Note (Addendum)
#   Stage I adenocarcinoma the lung/lipidic pattern.  Status post wedge resection clear margins.  CT scan October 2021--negative for any recurrence; mild thickening noted along the staple line/resection.  STABLE. Recommend imaging CT scan every 12 months for total of 5 years.  Will repeat imaging this time.  CT scan ordered  # Quit smoking-congratulated on continued smoking cessation; STABLE.   # vit D def-continue vitamin D.   Mychart messg re: CT results  # DISPOSITION: # CT chest 2 weeks # 41 m follow up with MD- labs- cbc/cmp-Dr.B   # I reviewed the blood work- with the patient in detail; also reviewed the imaging independently [as summarized above]; and with the patient in detail.   Cc; Dr.Taalia.

## 2022-05-02 NOTE — Progress Notes (Signed)
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  #Quit smoking [2017]  DIAGNOSIS: Right upper lobe lung cancer  STAGE: 1       ;GOALS: Cure  CURRENT/MOST RECENT THERAPY : Surveillance    Primary cancer of right lower lobe of lung ([place])  04/07/2018 Initial Diagnosis   Primary cancer of right lower lobe of lung ([facility])      HISTORY OF PRESENTING ILLNESS: Ambulating independently.  Accompanied by husband. Catherine Gilbert 46 y.o.  female history of stage I adenocarcinoma the lung is here for follow-up/review results of the CT scan.  Patient denies any worsening shortness of breath or cough.  No chest pain.  No headaches.  No bone pain.  Patient is continue to quit smoking.  Review of Systems  Constitutional:  Negative for chills, diaphoresis, fever, malaise/fatigue and weight loss.  HENT:  Negative for nosebleeds and sore throat.   Eyes:  Negative for double vision.  Respiratory:  Negative for cough, hemoptysis, sputum production, shortness of breath and wheezing.   Cardiovascular:  Negative for chest pain, palpitations, orthopnea and leg swelling.  Gastrointestinal:  Negative for abdominal pain, blood in stool, constipation, diarrhea, heartburn, melena, nausea and vomiting.  Genitourinary:  Negative for dysuria, frequency and urgency.  Musculoskeletal:  Negative for back pain and joint pain.  Skin: Negative.  Negative for itching and rash.  Neurological:  Negative for dizziness, tingling, focal weakness, weakness and headaches.   Endo/Heme/Allergies:  Does not bruise/bleed easily.  Psychiatric/Behavioral:  Negative for depression. The patient is not nervous/anxious and does not have insomnia.      MEDICAL HISTORY:  Past Medical History:  Diagnosis Date   Abnormal Pap smear    Anemia    Anxiety    Anxiety state 04/23/2011   Cancer ([place]) 02/2018   lung rt   Depression    Dysmenorrhea 08/27/2011   Dyspnea on exertion 10/30/2015   Essential hypertension 09/15/2012   Family history of ASCVD (arteriosclerotic cardiovascular disease) 12/31/2016   Family history of premature CAD 11/11/2016   GAD (generalized anxiety disorder) 10/30/2015   GERD (gastroesophageal reflux disease) 05/29/2014   Headache 01/01/2017   HSV (herpes simplex virus) infection    Hypertension    Initiation of Depo Provera 09/24/2011   Lung nodule    RLL on coronary calcium score CT   Migraine headache     SURGICAL HISTORY: Past Surgical History:  Procedure Laterality Date   BREAST CYST ASPIRATION Right 02/10/2018   neg   CESAREAN SECTION     x 3    COLONOSCOPY WITH PROPOFOL N/A 04/25/2021   Procedure: COLONOSCOPY WITH PROPOFOL;  Surgeon: Lin Landsman, MD;  Location: [facility];  Service: Endoscopy;  Laterality: N/A;   COLPOSCOPY  2010   DILATION AND CURETTAGE OF UTERUS     ESOPHAGOGASTRODUODENOSCOPY (EGD) WITH PROPOFOL N/A 09/20/2021   Procedure: ESOPHAGOGASTRODUODENOSCOPY (EGD) WITH PROPOFOL;  Surgeon: Lin Landsman, MD;  Location: [facility] ENDOSCOPY;  Service: Gastroenterology;  Laterality: N/A;   FLEXIBLE BRONCHOSCOPY N/A 03/29/2018   Procedure: PREOP  BRONCHOSCOPY;  Surgeon: Nestor Lewandowsky, MD;  Location: [facility];  Service: Thoracic;  Laterality: N/A;   HYSTEROSCOPY WITH D & C N/A 01/12/2020   Procedure: DILATATION AND CURETTAGE /HYSTEROSCOPY;  Surgeon: Homero Fellers, MD;  Location: [facility];  Service: Gynecology;  Laterality: N/A;   POLYPECTOMY  04/25/2021   Procedure: POLYPECTOMY;  Surgeon: Lin Landsman,  MD;  Location: [facility];  Service: Endoscopy;;   THORACOTOMY Right 03/29/2018   Procedure: THORACOTOMY MAJOR- POSSIBLE LOBECTOMY;  Surgeon: Nestor Lewandowsky, MD;  Location: [facility];  Service: Thoracic;  Laterality: Right;   TUBAL LIGATION     VIDEO ASSISTED THORACOSCOPY (VATS)/WEDGE RESECTION Right 03/29/2018   Procedure: VIDEO ASSISTED THORACOSCOPY (VATS)/WEDGE RESECTION;  Surgeon: Nestor Lewandowsky, MD;  Location: [facility];  Service: Thoracic;  Laterality: Right;    SOCIAL HISTORY: lives in [place]; 3 childeresn; quit smoking; in may 2017 [ since 15 until 1ppd].  Social History   Socioeconomic History   Marital status: Married    Spouse name: Francee Piccolo   Number of children: 3   Years of education: High school   Highest education level: Not on file  Occupational History   Not on file  Tobacco Use   Smoking status: Former    Packs/day: 1.00    Years: 25.00    Total pack years: 25.00    Types: Cigarettes    Quit date: 11/22/2015    Years since quitting: 6.4   Smokeless tobacco: Never  Vaping Use   Vaping Use: Some days   Substances: THC  Substance and Sexual Activity   Alcohol use: Yes    Comment: rare   Drug use: Yes    Types: Marijuana    Comment: occ   Sexual activity: Yes    Birth control/protection: Surgical    Comment: tubal ligation  Other Topics Concern   Not on file  Social History Narrative   10/13/19   From: the area   Living: Husband, Francee Piccolo ([id number]) and 3 children   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      Family: not close with family that are living, but good relationship with children      Enjoys: binge watch TV, crystals - metaphysical studies      Exercise: not currently   Diet: pretty good, lunch - salads, home cooking most of the time      Safety   Seat belts: Yes    Guns: Yes  and secure   Safe in relationships: Yes    Social Determinants of Health   Financial Resource Strain: Not on file  Food Insecurity: Not  on file  Transportation Needs: Not on file  Physical Activity: Not on file  Stress: Not on file  Social Connections: Not on file  Intimate Partner Violence: Not on file    FAMILY HISTORY: Family History  Problem Relation Age of Onset   Stroke Mother 30       massive stroke   Heart disease Father 67       congestive heart failure   Hypertension Father    Heart disease Brother 79       Suspected MI and SCD   Heart disease Sister    Stroke Sister 74   Breast cancer Maternal Grandmother 39   Breast cancer Cousin        x2 paternal, both alive    ALLERGIES:  has No Known Allergies.  MEDICATIONS:  Current Outpatient Medications  Medication Sig Dispense Refill   ALPRAZolam (XANAX) 0.5 MG tablet Take 1 tablet (0.5 mg  total) by mouth at bedtime as needed. 30 tablet 1   Aspirin-Caffeine (BC FAST PAIN RELIEF PO) Take 1 packet by mouth daily as needed (headache).     lisinopril (ZESTRIL) 10 MG tablet TAKE 1/2 TO 1 TABLET BY MOUTH EVERY DAY 90 tablet 0   omeprazole (PRILOSEC) 20 MG capsule Take 1 capsule (20 mg total) by mouth 2 (two) times daily before a meal. 60 capsule 0   sertraline (ZOLOFT) 100 MG tablet TAKE 1 AND 1/2 TABLETS(150 MG) BY MOUTH DAILY 135 tablet 1   valACYclovir (VALTREX) 500 MG tablet TAKE 2 TABLETS BY MOUTH TODAY, THEN 2 TABLETS TWICE DAILY AS NEEDED. 60 tablet 5   Ferrous Sulfate (IRON PO) Take by mouth. (Patient not taking: Reported on 05/02/2022)     No current facility-administered medications for this visit.      Marland Kitchen  PHYSICAL EXAMINATION: ECOG PERFORMANCE STATUS: 0 - Asymptomatic  Vitals:   05/02/22 1048  BP: (!) 132/92  Pulse: 92  Resp: 19  Temp: (!) 96.3 F (35.7 C)  SpO2: 99%   Filed Weights   05/02/22 1048  Weight: 169 lb 4.8 oz (76.8 kg)    Physical Exam Constitutional:      Comments: Alone.  Ambulating independently.  HENT:     Head: Normocephalic and atraumatic.     Mouth/Throat:     Pharynx: No oropharyngeal exudate.  Eyes:      Pupils: Pupils are equal, round, and reactive to light.  Cardiovascular:     Rate and Rhythm: Normal rate and regular rhythm.  Pulmonary:     Effort: Pulmonary effort is normal. No respiratory distress.     Breath sounds: Normal breath sounds. No wheezing.  Abdominal:     General: Bowel sounds are normal. There is no distension.     Palpations: Abdomen is soft. There is no mass.     Tenderness: There is no abdominal tenderness. There is no guarding or rebound.  Musculoskeletal:        General: No tenderness. Normal range of motion.     Cervical back: Normal range of motion and neck supple.  Skin:    General: Skin is warm.  Neurological:     Mental Status: She is alert and oriented to person, place, and time.  Psychiatric:        Mood and Affect: Affect normal.      LABORATORY DATA:  I have reviewed the data as listed Lab Results  Component Value Date   WBC 9.6 05/02/2022   HGB 13.0 05/02/2022   HCT 38.5 05/02/2022   MCV 91.9 05/02/2022   PLT 228 05/02/2022   Recent Labs    05/02/22 1027  NA 139  K 4.4  CL 104  CO2 27  GLUCOSE 105*  BUN 10  CREATININE 0.84  CALCIUM 9.2  GFRNONAA >60  PROT 7.5  ALBUMIN 4.5  AST 22  ALT 21  ALKPHOS 40  BILITOT 0.5    RADIOGRAPHIC STUDIES: I have personally reviewed the radiological images as listed and agreed with the findings in the report. No results found.   ASSESSMENT & PLAN:   Primary cancer of right lower lobe of lung ([place]) # Stage I adenocarcinoma the lung/lipidic pattern.  Status post wedge resection clear margins.  CT scan October 2021--negative for any recurrence; mild thickening noted along the staple line/resection.  STABLE. Recommend imaging CT scan every 12 months for total of 5 years.  Will repeat imaging this time.  CT scan ordered  #  Quit smoking-congratulated on continued smoking cessation; STABLE.   # vit D def-continue vitamin D.   Mychart messg re: CT results  # DISPOSITION: # CT chest 2 weeks #  74 m follow up with MD- labs- cbc/cmp-Dr.B   # I reviewed the blood work- with the patient in detail; also reviewed the imaging independently [as summarized above]; and with the patient in detail.   Cc; Dr.Taalia.    All questions were answered. The patient knows to call the clinic with any problems, questions or concerns.   Cammie Sickle, MD 05/02/2022 11:36 AM

## 2022-05-02 NOTE — Progress Notes (Signed)
Patient has no concerns at the moment. 

## 2022-05-12 ENCOUNTER — Ambulatory Visit
Admission: RE | Admit: 2022-05-12 | Discharge: 2022-05-12 | Disposition: A | Discharge status: Home / Self Care | Payer: BC Managed Care – PPO | Source: Ambulatory Visit | Attending: Internal Medicine | Admitting: Internal Medicine

## 2022-05-12 DIAGNOSIS — C3490 Malignant neoplasm of unspecified part of unspecified bronchus or lung: Secondary | ICD-10-CM | POA: Diagnosis not present

## 2022-05-12 DIAGNOSIS — C3431 Malignant neoplasm of lower lobe, right bronchus or lung: Secondary | ICD-10-CM | POA: Diagnosis not present

## 2022-05-12 MED ORDER — IOHEXOL 300 MG/ML  SOLN
75.0000 mL | Freq: Once | INTRAMUSCULAR | Status: AC | PRN
  Administered 2022-05-12: 75 mL via INTRAVENOUS

## 2022-05-26 ENCOUNTER — Encounter: Payer: Self-pay | Admitting: Family Medicine

## 2022-05-26 DIAGNOSIS — K219 Gastro-esophageal reflux disease without esophagitis: Secondary | ICD-10-CM

## 2022-05-26 NOTE — Telephone Encounter (Signed)
Per chart this has been refilled by GI in the past and it looks she may have to have an appointment with them to follow up before refills can be given? Ok to refill by [place]? Has an appointment on 06/12/22 with Dr Diona Browner for [facility]

## 2022-05-28 MED ORDER — OMEPRAZOLE 20 MG PO CPDR: 20.0000 mg | DELAYED_RELEASE_CAPSULE | Freq: Every day | ORAL | 5 refills | Status: AC

## 2022-06-12 ENCOUNTER — Ambulatory Visit: Payer: BC Managed Care – PPO | Admitting: Family Medicine

## 2022-06-12 ENCOUNTER — Encounter: Payer: Self-pay | Admitting: Family Medicine

## 2022-06-12 VITALS — BP 136/76 | HR 82 | Temp 97.3°F | Ht 64.0 in | Wt 175.0 lb

## 2022-06-12 DIAGNOSIS — I10 Essential (primary) hypertension: Secondary | ICD-10-CM | POA: Diagnosis not present

## 2022-06-12 DIAGNOSIS — K219 Gastro-esophageal reflux disease without esophagitis: Secondary | ICD-10-CM | POA: Diagnosis not present

## 2022-06-12 DIAGNOSIS — G4733 Obstructive sleep apnea (adult) (pediatric): Secondary | ICD-10-CM

## 2022-06-12 DIAGNOSIS — E559 Vitamin D deficiency, unspecified: Secondary | ICD-10-CM

## 2022-06-12 DIAGNOSIS — F411 Generalized anxiety disorder: Secondary | ICD-10-CM

## 2022-06-12 DIAGNOSIS — D509 Iron deficiency anemia, unspecified: Secondary | ICD-10-CM

## 2022-06-12 DIAGNOSIS — I493 Ventricular premature depolarization: Secondary | ICD-10-CM

## 2022-06-12 DIAGNOSIS — Z85118 Personal history of other malignant neoplasm of bronchus and lung: Secondary | ICD-10-CM | POA: Diagnosis not present

## 2022-06-12 NOTE — Assessment & Plan Note (Addendum)
Chronic, on omeprazole 20 mg daily  Followed by Dr. Marius Ditch, last EGD 08/2021 nml, small hiatal hernia.

## 2022-06-12 NOTE — Assessment & Plan Note (Signed)
Saw Dr. Saunders Revel in past for this.. minimally symptomatic.

## 2022-06-12 NOTE — Assessment & Plan Note (Signed)
Due for re-eval. 

## 2022-06-12 NOTE — Progress Notes (Signed)
Patient ID: Catherine Gilbert, female    DOB: 11-06-1975, 47 y.o.   MRN: [id number]  This visit was conducted in person.  BP 136/76   Pulse 82   Temp (!) 97.3 F (36.3 C) (Temporal)   Ht 5\' 4"  (1.626 m)   Wt 175 lb (79.4 kg)   LMP 06/01/2022   SpO2 99%   BMI 30.04 kg/m    CC:  Chief Complaint  Patient presents with   Transitions Of Care    TOC from Dr. Einar Pheasant.    Subjective:   HPI: Catherine Gilbert is a 46 y.o. female presenting on 06/12/2022 for Transitions Of Care (TOC from Dr. Einar Pheasant.)  Previous PCP: Dr. Einar Pheasant GYN: none Last physical: 02/2021  Hypertension:  Well-controlled on lisinopril 10 tablet p.o. daily BP Readings from Last 3 Encounters:  06/12/22 136/76  05/02/22 (!) 132/92  10/10/21 110/62  Using medication without problems or lightheadedness:  none Chest pain with exertion: none Edema:none Short of breath: none Average home BPs: 130/70 Other issues:  PVC, bradycardia at night: Saw Dr. Saunders Revel in past for this.. minimally symptomatic.  Primary adenocarcinoma lung , right: Stage I  Dx 2020 Status post wedge resection with clear margins.  Dr. Rogue Bussing oncology following with CT scan every 12 months for total of 5 years.  Last in 04/2022     Hiatal hernia, chronic GERD: on omeprazole 20 mg daily    GAD:  moderate control on sertraline 100 mg daily, still having to use alprazolam  as needed  every 2 weeks   Improved with improved  marital relationship.  Lab Results  Component Value Date   CHOL 185 03/25/2021   HDL 39.30 03/25/2021   LDLCALC 101 (H) 03/22/2020   LDLDIRECT 117.0 03/25/2021   TRIG 238.0 (H) 03/25/2021   CHOLHDL 5 03/25/2021   The 10-year ASCVD risk score (Arnett DK, et al., 2019) is: 2.1%   Values used to calculate the score:     Age: 21 years     Sex: Female     Is Non-Hispanic African American: No     Diabetic: No     Tobacco smoker: No     Systolic Blood Pressure: 812 mmHg     Is BP treated: Yes     HDL Cholesterol: 39.3 mg/dL      Total Cholesterol: 185 mg/dL   Relevant past medical, surgical, family and social history reviewed and updated as indicated. Interim medical history since our last visit reviewed. Allergies and medications reviewed and updated. Outpatient Medications Prior to Visit  Medication Sig Dispense Refill   ALPRAZolam (XANAX) 0.5 MG tablet Take 1 tablet (0.5 mg total) by mouth at bedtime as needed. 30 tablet 1   Aspirin-Caffeine (BC FAST PAIN RELIEF PO) Take 1 packet by mouth daily as needed (headache).     lisinopril (ZESTRIL) 10 MG tablet TAKE 1/2 TO 1 TABLET BY MOUTH EVERY DAY 90 tablet 0   omeprazole (PRILOSEC) 20 MG capsule Take 1 capsule (20 mg total) by mouth daily. 30 capsule 5   sertraline (ZOLOFT) 100 MG tablet TAKE 1 AND 1/2 TABLETS(150 MG) BY MOUTH DAILY 135 tablet 1   valACYclovir (VALTREX) 500 MG tablet TAKE 2 TABLETS BY MOUTH TODAY, THEN 2 TABLETS TWICE DAILY AS NEEDED. 60 tablet 5   Ferrous Sulfate (IRON PO) Take by mouth.     No facility-administered medications prior to visit.     Per HPI unless specifically indicated in ROS section below Review of  Systems  Constitutional:  Negative for fatigue and fever.  HENT:  Negative for congestion.   Eyes:  Negative for pain.  Respiratory:  Negative for cough and shortness of breath.   Cardiovascular:  Negative for chest pain, palpitations and leg swelling.  Gastrointestinal:  Negative for abdominal pain.  Genitourinary:  Negative for dysuria and vaginal bleeding.  Musculoskeletal:  Negative for back pain.  Neurological:  Negative for syncope, light-headedness and headaches.  Psychiatric/Behavioral:  Negative for dysphoric mood.    Objective:  BP 136/76   Pulse 82   Temp (!) 97.3 F (36.3 C) (Temporal)   Ht 5\' 4"  (1.626 m)   Wt 175 lb (79.4 kg)   LMP 06/01/2022   SpO2 99%   BMI 30.04 kg/m   Wt Readings from Last 3 Encounters:  06/12/22 175 lb (79.4 kg)  05/02/22 169 lb 4.8 oz (76.8 kg)  10/10/21 158 lb 3 oz (71.8 kg)       Physical Exam Vitals and nursing note reviewed.  Constitutional:      General: She is not in acute distress.    Appearance: Normal appearance. She is well-developed. She is not ill-appearing or toxic-appearing.  HENT:     Head: Normocephalic.     Right Ear: Hearing, tympanic membrane, ear canal and external ear normal.     Left Ear: Hearing, tympanic membrane, ear canal and external ear normal.     Nose: Nose normal.  Eyes:     General: Lids are normal. Lids are everted, no foreign bodies appreciated.     Conjunctiva/sclera: Conjunctivae normal.     Pupils: Pupils are equal, round, and reactive to light.  Neck:     Thyroid: No thyroid mass or thyromegaly.     Vascular: No carotid bruit.     Trachea: Trachea normal.  Cardiovascular:     Rate and Rhythm: Normal rate and regular rhythm.     Heart sounds: Normal heart sounds, S1 normal and S2 normal. No murmur heard.    No gallop.  Pulmonary:     Effort: Pulmonary effort is normal. No respiratory distress.     Breath sounds: Normal breath sounds. No wheezing, rhonchi or rales.  Abdominal:     General: Bowel sounds are normal. There is no distension or abdominal bruit.     Palpations: Abdomen is soft. There is no fluid wave or mass.     Tenderness: There is no abdominal tenderness. There is no guarding or rebound.     Hernia: No hernia is present.  Musculoskeletal:     Cervical back: Normal range of motion and neck supple.  Lymphadenopathy:     Cervical: No cervical adenopathy.  Skin:    General: Skin is warm and dry.     Findings: No rash.  Neurological:     Mental Status: She is alert.     Cranial Nerves: No cranial nerve deficit.     Sensory: No sensory deficit.  Psychiatric:        Mood and Affect: Mood is not anxious or depressed.        Speech: Speech normal.        Behavior: Behavior normal. Behavior is cooperative.        Judgment: Judgment normal.      Results for orders placed or performed in visit on 05/02/22   CBC with Differential  Result Value Ref Range   WBC 9.6 4.0 - 10.5 K/uL   RBC 4.19 3.87 - 5.11 MIL/uL   Hemoglobin  13.0 12.0 - 15.0 g/dL   HCT 38.5 36.0 - 46.0 %   MCV 91.9 80.0 - 100.0 fL   MCH 31.0 26.0 - 34.0 pg   MCHC 33.8 30.0 - 36.0 g/dL   RDW 11.9 11.5 - 15.5 %   Platelets 228 150 - 400 K/uL   nRBC 0.0 0.0 - 0.2 %   Neutrophils Relative % 56 %   Neutro Abs 5.3 1.7 - 7.7 K/uL   Lymphocytes Relative 33 %   Lymphs Abs 3.2 0.7 - 4.0 K/uL   Monocytes Relative 9 %   Monocytes Absolute 0.9 0.1 - 1.0 K/uL   Eosinophils Relative 2 %   Eosinophils Absolute 0.2 0.0 - 0.5 K/uL   Basophils Relative 0 %   Basophils Absolute 0.0 0.0 - 0.1 K/uL   Immature Granulocytes 0 %   Abs Immature Granulocytes 0.04 0.00 - 0.07 K/uL  Comprehensive metabolic panel  Result Value Ref Range   Sodium 139 135 - 145 mmol/L   Potassium 4.4 3.5 - 5.1 mmol/L   Chloride 104 98 - 111 mmol/L   CO2 27 22 - 32 mmol/L   Glucose, Bld 105 (H) 70 - 99 mg/dL   BUN 10 6 - 20 mg/dL   Creatinine, Ser 0.84 0.44 - 1.00 mg/dL   Calcium 9.2 8.9 - 10.3 mg/dL   Total Protein 7.5 6.5 - 8.1 g/dL   Albumin 4.5 3.5 - 5.0 g/dL   AST 22 15 - 41 U/L   ALT 21 0 - 44 U/L   Alkaline Phosphatase 40 38 - 126 U/L   Total Bilirubin 0.5 0.3 - 1.2 mg/dL   GFR, Estimated >60 >60 mL/min   Anion gap 8 5 - 15     COVID 19 screen:  No recent travel or known exposure to COVID19 The patient denies respiratory symptoms of COVID 19 at this time. The importance of social distancing was discussed today.   Assessment and Plan    Problem List Items Addressed This Visit     Chronic GERD    Chronic, on omeprazole 20 mg daily  Followed by Dr. Marius Ditch, last EGD 08/2021 nml, small hiatal hernia.      Essential hypertension    Stable, chronic.  Continue current medication.   Lisinopril 10 mg daily        GAD (generalized anxiety disorder)    Chronic.  Continue current medication.   Sertraline 100 mg daily  Using alprazolam  0.5 mg  prn about 2 times a month.      History of adenocarcinoma of lung - Primary    Status post wedge resection with clear margins.  Dr. Rogue Bussing oncology following with CT scan every 12 months for total of 5 years      Iron deficiency anemia     Resolved at last check 04/2022      Mild obstructive sleep apnea    Mild ,chronic.. improved with night guard.  Dr. Toy Cookey      PVC's (premature ventricular contractions)    Saw Dr. Saunders Revel in past for this.. minimally symptomatic.      Vitamin D deficiency     Due for re-eval.         Eliezer Lofts, MD

## 2022-06-12 NOTE — Assessment & Plan Note (Signed)
Mild ,chronic.. improved with night guard.  Dr. Toy Cookey

## 2022-06-12 NOTE — Assessment & Plan Note (Signed)
Status post wedge resection with clear margins.  Dr. Rogue Bussing oncology following with CT scan every 12 months for total of 5 years

## 2022-06-12 NOTE — Assessment & Plan Note (Signed)
Chronic.  Continue current medication.   Sertraline 100 mg daily  Using alprazolam  0.5 mg prn about 2 times a month.

## 2022-06-12 NOTE — Assessment & Plan Note (Addendum)
Stable, chronic.  Continue current medication.   Lisinopril 10 mg daily

## 2022-06-12 NOTE — Assessment & Plan Note (Signed)
Resolved at last check 04/2022

## 2022-07-11 ENCOUNTER — Ambulatory Visit: Payer: BC Managed Care – PPO | Admitting: Family Medicine

## 2022-07-11 ENCOUNTER — Encounter: Payer: Self-pay | Admitting: Family Medicine

## 2022-07-11 VITALS — BP 110/70 | HR 89 | Temp 99.0°F | Ht 64.0 in | Wt 179.5 lb

## 2022-07-11 DIAGNOSIS — N6311 Unspecified lump in the right breast, upper outer quadrant: Secondary | ICD-10-CM | POA: Diagnosis not present

## 2022-07-11 NOTE — Progress Notes (Signed)
Patient ID: Catherine Gilbert, female    DOB: 03/25/1976, 47 y.o.   MRN: [id number]  This visit was conducted in person.  BP 110/70   Pulse 89   Temp 99 F (37.2 C) (Oral)   Ht 5\' 4"  (1.626 m)   Wt 179 lb 8 oz (81.4 kg)   LMP 06/01/2022   SpO2 96%   BMI 30.81 kg/m    CC:  Chief Complaint  Patient presents with   Breast Mass    Right    Subjective:   HPI: Catherine Gilbert is a 47 y.o. female presenting on 07/11/2022 for Breast Mass (Right)  Previous pt of Dr. 09/09/2022    In last  few months noted ache in right lateral breast.  Now in last week noted  small mass lateral right breast, sore.  No skin changes, no nipple change or discharge. No fever.   Last mammogram  12/16/2021 reviewed: MPRESSION: Further evaluation is suggested for calcifications in the left breast.  RECOMMENDATION: Diagnostic mammogram of the left breast. (Code:FI-L-64M)  The patient will be contacted regarding the findings, and additional imaging will be scheduled. 12/25/21 diagnostic mammogram:    MPRESSION: Scattered punctate calcifications, stable from 2019 No mammographic evidence of malignancy.  RECOMMENDATION: Screening mammogram in one year.  Has history of breast cyst, S/P aspiration.  Relevant past medical, surgical, family and social history reviewed and updated as indicated. Interim medical history since our last visit reviewed. Allergies and medications reviewed and updated. Outpatient Medications Prior to Visit  Medication Sig Dispense Refill   ALPRAZolam (XANAX) 0.5 MG tablet Take 1 tablet (0.5 mg total) by mouth at bedtime as needed. 30 tablet 1   Aspirin-Caffeine (BC FAST PAIN RELIEF PO) Take 1 packet by mouth daily as needed (headache).     lisinopril (ZESTRIL) 10 MG tablet TAKE 1/2 TO 1 TABLET BY MOUTH EVERY DAY 90 tablet 0   omeprazole (PRILOSEC) 20 MG capsule Take 1 capsule (20 mg total) by mouth daily. 30 capsule 5   sertraline (ZOLOFT) 100 MG tablet TAKE 1 AND 1/2 TABLETS(150  MG) BY MOUTH DAILY 135 tablet 1   valACYclovir (VALTREX) 500 MG tablet TAKE 2 TABLETS BY MOUTH TODAY, THEN 2 TABLETS TWICE DAILY AS NEEDED. 60 tablet 5   No facility-administered medications prior to visit.     Per HPI unless specifically indicated in ROS section below Review of Systems  Constitutional:  Negative for fatigue and fever.  HENT:  Negative for congestion.   Eyes:  Negative for pain.  Respiratory:  Negative for cough and shortness of breath.   Cardiovascular:  Negative for chest pain, palpitations and leg swelling.  Gastrointestinal:  Negative for abdominal pain.  Genitourinary:  Negative for dysuria and vaginal bleeding.  Musculoskeletal:  Negative for back pain.  Neurological:  Negative for syncope, light-headedness and headaches.  Psychiatric/Behavioral:  Negative for dysphoric mood.    Objective:  BP 110/70   Pulse 89   Temp 99 F (37.2 C) (Oral)   Ht 5\' 4"  (1.626 m)   Wt 179 lb 8 oz (81.4 kg)   LMP 06/01/2022   SpO2 96%   BMI 30.81 kg/m   Wt Readings from Last 3 Encounters:  07/11/22 179 lb 8 oz (81.4 kg)  06/12/22 175 lb (79.4 kg)  05/02/22 169 lb 4.8 oz (76.8 kg)      Physical Exam Constitutional:      General: She is not in acute distress.    Appearance: Normal appearance.  She is well-developed. She is not ill-appearing or toxic-appearing.  HENT:     Head: Normocephalic.     Right Ear: Hearing, tympanic membrane, ear canal and external ear normal. Tympanic membrane is not erythematous, retracted or bulging.     Left Ear: Hearing, tympanic membrane, ear canal and external ear normal. Tympanic membrane is not erythematous, retracted or bulging.     Nose: No mucosal edema or rhinorrhea.     Right Sinus: No maxillary sinus tenderness or frontal sinus tenderness.     Left Sinus: No maxillary sinus tenderness or frontal sinus tenderness.     Mouth/Throat:     Pharynx: Uvula midline.  Eyes:     General: Lids are normal. Lids are everted, no foreign  bodies appreciated.     Conjunctiva/sclera: Conjunctivae normal.     Pupils: Pupils are equal, round, and reactive to light.  Neck:     Thyroid: No thyroid mass or thyromegaly.     Vascular: No carotid bruit.     Trachea: Trachea normal.  Cardiovascular:     Rate and Rhythm: Normal rate and regular rhythm.     Pulses: Normal pulses.     Heart sounds: Normal heart sounds, S1 normal and S2 normal. No murmur heard.    No friction rub. No gallop.  Pulmonary:     Effort: Pulmonary effort is normal. No tachypnea or respiratory distress.     Breath sounds: Normal breath sounds. No decreased breath sounds, wheezing, rhonchi or rales.  Chest:       Comments: Area of soreness, no mass palpated today by myself or patient Abdominal:     General: Bowel sounds are normal.     Palpations: Abdomen is soft.     Tenderness: There is no abdominal tenderness.  Musculoskeletal:     Cervical back: Normal range of motion and neck supple.  Skin:    General: Skin is warm and dry.     Findings: No rash.  Neurological:     Mental Status: She is alert.  Psychiatric:        Mood and Affect: Mood is not anxious or depressed.        Speech: Speech normal.        Behavior: Behavior normal. Behavior is cooperative.        Thought Content: Thought content normal.        Judgment: Judgment normal.       Results for orders placed or performed in visit on 05/02/22  CBC with Differential  Result Value Ref Range   WBC 9.6 4.0 - 10.5 K/uL   RBC 4.19 3.87 - 5.11 MIL/uL   Hemoglobin 13.0 12.0 - 15.0 g/dL   HCT 63.5 56.3 - 46.9 %   MCV 91.9 80.0 - 100.0 fL   MCH 31.0 26.0 - 34.0 pg   MCHC 33.8 30.0 - 36.0 g/dL   RDW 58.4 87.4 - 25.4 %   Platelets 228 150 - 400 K/uL   nRBC 0.0 0.0 - 0.2 %   Neutrophils Relative % 56 %   Neutro Abs 5.3 1.7 - 7.7 K/uL   Lymphocytes Relative 33 %   Lymphs Abs 3.2 0.7 - 4.0 K/uL   Monocytes Relative 9 %   Monocytes Absolute 0.9 0.1 - 1.0 K/uL   Eosinophils Relative 2 %    Eosinophils Absolute 0.2 0.0 - 0.5 K/uL   Basophils Relative 0 %   Basophils Absolute 0.0 0.0 - 0.1 K/uL   Immature Granulocytes 0 %  Abs Immature Granulocytes 0.04 0.00 - 0.07 K/uL  Comprehensive metabolic panel  Result Value Ref Range   Sodium 139 135 - 145 mmol/L   Potassium 4.4 3.5 - 5.1 mmol/L   Chloride 104 98 - 111 mmol/L   CO2 27 22 - 32 mmol/L   Glucose, Bld 105 (H) 70 - 99 mg/dL   BUN 10 6 - 20 mg/dL   Creatinine, Ser 2.17 0.44 - 1.00 mg/dL   Calcium 9.2 8.9 - 82.7 mg/dL   Total Protein 7.5 6.5 - 8.1 g/dL   Albumin 4.5 3.5 - 5.0 g/dL   AST 22 15 - 41 U/L   ALT 21 0 - 44 U/L   Alkaline Phosphatase 40 38 - 126 U/L   Total Bilirubin 0.5 0.3 - 1.2 mg/dL   GFR, Estimated >49 >76 mL/min   Anion gap 8 5 - 15    Assessment and Plan  Mass of upper outer quadrant of right breast Assessment & Plan: Acute, most likely secondary to cyst that is potentially now resolved.  Given continued pain in that area I have suggested she move forward with diagnostic mammogram and ultrasound.  Orders: -     US BREAST LTD UNI RIGHT INC AXILLA; Future -     US BREAST LTD UNI LEFT INC AXILLA; Future -     MM DIAG BREAST TOMO BILATERAL; Future    No follow-ups on file.   Kerby Nora, MD

## 2022-07-11 NOTE — Patient Instructions (Signed)
Please call the location of your choice from the menu below to schedule your Mammogram and/or Bone Density appointment.    [facility]   Phone:  [phone]   [address]                                                                            [place], [place] [address]                                            Services: 3D Mammogram and Bone [facility] at [facility]  Phone:  [phone]   [address]                        [place], [place] [address]                                              Services:  3D Mammogram and Bone Density

## 2022-07-11 NOTE — Assessment & Plan Note (Signed)
Acute, most likely secondary to cyst that is potentially now resolved.  Given continued pain in that area I have suggested she move forward with diagnostic mammogram and ultrasound.

## 2022-07-22 ENCOUNTER — Telehealth: Payer: Self-pay | Admitting: Family Medicine

## 2022-07-22 MED ORDER — LISINOPRIL 10 MG PO TABS: ORAL_TABLET | ORAL | 1 refills | Status: DC

## 2022-07-22 NOTE — Telephone Encounter (Signed)
Refills sent as requested

## 2022-07-22 NOTE — Telephone Encounter (Signed)
Prescription Request  07/22/2022  Is this a "Controlled Substance" medicine? No  LOV: 07/11/2022  What is the name of the medication or equipment? lisinopril (ZESTRIL) 10 MG tablet   Have you contacted your pharmacy to request a refill? No   Which pharmacy would you like this sent to?  [id number] - [place], [place] - [address] AT [facility] OF [address] [place] [address] [place] [address] Phone: [phone] Fax: [phone]   Patient has two days left  Patient notified that their request is being sent to the clinical staff for review and that they should receive a response within 2 business days.   Please advise at Mobile [phone] (mobile)

## 2022-07-23 ENCOUNTER — Ambulatory Visit
Admission: RE | Admit: 2022-07-23 | Discharge: 2022-07-23 | Disposition: A | Discharge status: Home / Self Care | Payer: BC Managed Care – PPO | Source: Ambulatory Visit | Attending: Family Medicine | Admitting: Family Medicine

## 2022-07-23 DIAGNOSIS — N6489 Other specified disorders of breast: Secondary | ICD-10-CM | POA: Diagnosis not present

## 2022-07-23 DIAGNOSIS — N6311 Unspecified lump in the right breast, upper outer quadrant: Secondary | ICD-10-CM | POA: Insufficient documentation

## 2022-07-23 DIAGNOSIS — R922 Inconclusive mammogram: Secondary | ICD-10-CM | POA: Diagnosis not present

## 2022-08-05 DIAGNOSIS — G4733 Obstructive sleep apnea (adult) (pediatric): Secondary | ICD-10-CM | POA: Diagnosis not present

## 2022-09-15 ENCOUNTER — Other Ambulatory Visit: Payer: Self-pay | Admitting: *Deleted

## 2022-09-15 DIAGNOSIS — F411 Generalized anxiety disorder: Secondary | ICD-10-CM

## 2022-09-15 MED ORDER — SERTRALINE HCL 100 MG PO TABS: ORAL_TABLET | ORAL | 0 refills | Status: DC

## 2022-09-26 DIAGNOSIS — E063 Autoimmune thyroiditis: Secondary | ICD-10-CM | POA: Diagnosis not present

## 2022-09-26 DIAGNOSIS — N951 Menopausal and female climacteric states: Secondary | ICD-10-CM | POA: Diagnosis not present

## 2022-09-30 DIAGNOSIS — N951 Menopausal and female climacteric states: Secondary | ICD-10-CM | POA: Diagnosis not present

## 2022-09-30 DIAGNOSIS — Z79890 Hormone replacement therapy (postmenopausal): Secondary | ICD-10-CM | POA: Diagnosis not present

## 2022-09-30 DIAGNOSIS — Z6830 Body mass index (BMI) 30.0-30.9, adult: Secondary | ICD-10-CM | POA: Diagnosis not present

## 2022-10-28 DIAGNOSIS — E063 Autoimmune thyroiditis: Secondary | ICD-10-CM | POA: Diagnosis not present

## 2022-10-28 DIAGNOSIS — N951 Menopausal and female climacteric states: Secondary | ICD-10-CM | POA: Diagnosis not present

## 2022-10-29 ENCOUNTER — Telehealth: Payer: Self-pay | Admitting: *Deleted

## 2022-10-29 DIAGNOSIS — I10 Essential (primary) hypertension: Secondary | ICD-10-CM

## 2022-10-29 DIAGNOSIS — D509 Iron deficiency anemia, unspecified: Secondary | ICD-10-CM

## 2022-10-29 DIAGNOSIS — E559 Vitamin D deficiency, unspecified: Secondary | ICD-10-CM

## 2022-10-29 NOTE — Telephone Encounter (Signed)
Look like TSH is normally checked yearly but I don't see a Dx code for why TSH has been checked in the past?  Please add if appropriate.

## 2022-10-29 NOTE — Telephone Encounter (Signed)
-----   Message from Alvina Chou sent at 10/29/2022  2:45 PM EDT ----- Regarding: Lab orders for Thursday, 5.9.24 Patient is scheduled for CPX labs, please order future labs, Thanks , Camelia Eng

## 2022-10-30 DIAGNOSIS — Z6830 Body mass index (BMI) 30.0-30.9, adult: Secondary | ICD-10-CM | POA: Diagnosis not present

## 2022-10-30 DIAGNOSIS — R6882 Decreased libido: Secondary | ICD-10-CM | POA: Diagnosis not present

## 2022-10-30 DIAGNOSIS — R232 Flushing: Secondary | ICD-10-CM | POA: Diagnosis not present

## 2022-10-30 DIAGNOSIS — N951 Menopausal and female climacteric states: Secondary | ICD-10-CM | POA: Diagnosis not present

## 2022-11-04 DIAGNOSIS — D2371 Other benign neoplasm of skin of right lower limb, including hip: Secondary | ICD-10-CM | POA: Diagnosis not present

## 2022-11-04 DIAGNOSIS — D485 Neoplasm of uncertain behavior of skin: Secondary | ICD-10-CM | POA: Diagnosis not present

## 2022-11-04 DIAGNOSIS — L811 Chloasma: Secondary | ICD-10-CM | POA: Diagnosis not present

## 2022-11-04 DIAGNOSIS — L570 Actinic keratosis: Secondary | ICD-10-CM | POA: Diagnosis not present

## 2022-11-06 ENCOUNTER — Other Ambulatory Visit: Payer: BC Managed Care – PPO

## 2022-11-11 ENCOUNTER — Other Ambulatory Visit (INDEPENDENT_AMBULATORY_CARE_PROVIDER_SITE_OTHER): Payer: BC Managed Care – PPO

## 2022-11-11 DIAGNOSIS — D509 Iron deficiency anemia, unspecified: Secondary | ICD-10-CM | POA: Diagnosis not present

## 2022-11-11 DIAGNOSIS — I10 Essential (primary) hypertension: Secondary | ICD-10-CM | POA: Diagnosis not present

## 2022-11-11 DIAGNOSIS — E559 Vitamin D deficiency, unspecified: Secondary | ICD-10-CM | POA: Diagnosis not present

## 2022-11-11 LAB — COMPREHENSIVE METABOLIC PANEL
ALT: 16 U/L (ref 0–35)
AST: 16 U/L (ref 0–37)
Albumin: 4.3 g/dL (ref 3.5–5.2)
Alkaline Phosphatase: 33 U/L — ABNORMAL LOW (ref 39–117)
BUN: 12 mg/dL (ref 6–23)
CO2: 28 mEq/L (ref 19–32)
Calcium: 9.5 mg/dL (ref 8.4–10.5)
Chloride: 104 mEq/L (ref 96–112)
Creatinine, Ser: 0.93 mg/dL (ref 0.40–1.20)
GFR: 73.18 mL/min (ref >60.00)
Glucose, Bld: 84 mg/dL (ref 70–99)
Potassium: 4 mEq/L (ref 3.5–5.1)
Sodium: 140 mEq/L (ref 135–145)
Total Bilirubin: 0.4 mg/dL (ref 0.2–1.2)
Total Protein: 7 g/dL (ref 6.0–8.3)

## 2022-11-11 LAB — CBC WITH DIFFERENTIAL/PLATELET
Basophils Absolute: 0 10*3/uL (ref 0.0–0.1)
Basophils Relative: 0.5 % (ref 0.0–3.0)
Eosinophils Absolute: 0.1 10*3/uL (ref 0.0–0.7)
Eosinophils Relative: 1.2 % (ref 0.0–5.0)
HCT: 38.4 % (ref 36.0–46.0)
Hemoglobin: 12.9 g/dL (ref 12.0–15.0)
Lymphocytes Relative: 22.7 % (ref 12.0–46.0)
Lymphs Abs: 2 10*3/uL (ref 0.7–4.0)
MCHC: 33.5 g/dL (ref 30.0–36.0)
MCV: 88.6 fl (ref 78.0–100.0)
Monocytes Absolute: 0.7 10*3/uL (ref 0.1–1.0)
Monocytes Relative: 8.1 % (ref 3.0–12.0)
Neutro Abs: 5.9 10*3/uL (ref 1.4–7.7)
Neutrophils Relative %: 67.5 % (ref 43.0–77.0)
Platelets: 275 10*3/uL (ref 150.0–400.0)
RBC: 4.34 Mil/uL (ref 3.87–5.11)
RDW: 13.1 % (ref 11.5–15.5)
WBC: 8.8 10*3/uL (ref 4.0–10.5)

## 2022-11-11 LAB — LIPID PANEL
Cholesterol: 176 mg/dL (ref 0–200)
HDL: 35.9 mg/dL — ABNORMAL LOW (ref >39.00)
LDL Cholesterol: 116 mg/dL — ABNORMAL HIGH (ref 0–99)
NonHDL: 140.32
Total CHOL/HDL Ratio: 5
Triglycerides: 124 mg/dL (ref 0.0–149.0)
VLDL: 24.8 mg/dL (ref 0.0–40.0)

## 2022-11-11 LAB — VITAMIN D 25 HYDROXY (VIT D DEFICIENCY, FRACTURES): VITD: 24.68 ng/mL — ABNORMAL LOW (ref 30.00–100.00)

## 2022-11-11 NOTE — Progress Notes (Signed)
No critical labs need to be addressed urgently. We will discuss labs in detail at upcoming office visit.   

## 2022-11-13 ENCOUNTER — Encounter: Payer: Self-pay | Admitting: Family Medicine

## 2022-11-13 ENCOUNTER — Ambulatory Visit (INDEPENDENT_AMBULATORY_CARE_PROVIDER_SITE_OTHER): Payer: BC Managed Care – PPO | Admitting: Family Medicine

## 2022-11-13 ENCOUNTER — Other Ambulatory Visit (HOSPITAL_COMMUNITY)
Admission: RE | Admit: 2022-11-13 | Discharge: 2022-11-13 | Disposition: A | Discharge status: Home / Self Care | Payer: BC Managed Care – PPO | Source: Ambulatory Visit | Attending: Family Medicine | Admitting: Family Medicine

## 2022-11-13 VITALS — BP 138/88 | HR 81 | Temp 97.5°F | Ht 63.75 in | Wt 177.4 lb

## 2022-11-13 DIAGNOSIS — I10 Essential (primary) hypertension: Secondary | ICD-10-CM

## 2022-11-13 DIAGNOSIS — I493 Ventricular premature depolarization: Secondary | ICD-10-CM

## 2022-11-13 DIAGNOSIS — Z01419 Encounter for gynecological examination (general) (routine) without abnormal findings: Secondary | ICD-10-CM

## 2022-11-13 DIAGNOSIS — Z0000 Encounter for general adult medical examination without abnormal findings: Secondary | ICD-10-CM | POA: Diagnosis not present

## 2022-11-13 DIAGNOSIS — F411 Generalized anxiety disorder: Secondary | ICD-10-CM

## 2022-11-13 DIAGNOSIS — M25512 Pain in left shoulder: Secondary | ICD-10-CM

## 2022-11-13 DIAGNOSIS — C3431 Malignant neoplasm of lower lobe, right bronchus or lung: Secondary | ICD-10-CM

## 2022-11-13 MED ORDER — VITAMIN D3 1.25 MG (50000 UT) PO CAPS: 1.0000 | ORAL_CAPSULE | ORAL | 0 refills | Status: DC

## 2022-11-13 NOTE — Assessment & Plan Note (Signed)
Saw Dr. End in past for this.. minimally symptomatic. 

## 2022-11-13 NOTE — Assessment & Plan Note (Signed)
Chronic.  Continue current medication.   Sertraline 100 mg daily  Using alprazolam  0.5 mg prn about 2 times a month. 

## 2022-11-13 NOTE — Patient Instructions (Addendum)
Start home PT for left shoulder... call or follow up if not improving.  Start vit D weekly x 12

## 2022-11-13 NOTE — Assessment & Plan Note (Signed)
Stable, chronic.  Continue current medication.   Lisinopril 10 mg daily   

## 2022-11-13 NOTE — Assessment & Plan Note (Signed)
Status post wedge resection with clear margins.  Dr. Brahmanday oncology following with CT scan every 12 months for total of 5 years 

## 2022-11-13 NOTE — Progress Notes (Signed)
Patient ID: Catherine Gilbert, female    DOB: Feb 11, 1976, 47 y.o.   MRN: [id number]  This visit was conducted in person.  BP 138/88   Pulse 81   Temp (!) 97.5 F (36.4 C) (Temporal)   Ht 5' 3.75" (1.619 m)   Wt 177 lb 6 oz (80.5 kg)   LMP 07/22/2022   SpO2 97%   BMI 30.69 kg/m    CC:  Chief Complaint  Patient presents with   Annual Exam    Subjective:   HPI: Catherine Gilbert is a 47 y.o. female presenting on 11/13/2022 for Annual Exam  The patient presents for complete physical and review of chronic health problems. He/She also has the following acute concerns today: HAs noted  2 lesions on  vulva, had had similar in past few months.  Areas are sore, mild swelling.  Occ discharge.  Previous PCP: Dr. Selena Batten GYN: none Last physical: 02/2021  Hypertension:  Well-controlled on lisinopril 10 tablet p.o. daily BP Readings from Last 3 Encounters:  11/13/22 138/88  07/11/22 110/70  06/12/22 136/76  Using medication without problems or lightheadedness:  none Chest pain with exertion: none Edema:none Short of breath: none Average home BPs: 130/70 Other issues:  PVC, bradycardia at night: Saw Dr. Okey Dupre in past for this.. minimally symptomatic.  Primary adenocarcinoma lung , right: Stage I  Dx 2020 Status post wedge resection with clear margins.  Dr. Donneta Romberg oncology following with CT scan every 12 months for total of 5 years.  Last in 04/2022     Hiatal hernia, chronic GERD: on omeprazole 20 mg daily    GAD:  moderate control on sertraline 100 mg daily, still having to use alprazolam  as needed  every 2 weeks   Improved with improved  marital relationship.  PHQ9: 2, GAD7 5.   She has started testosterone pellet and progesterone... [facility]   Lab Results  Component Value Date   CHOL 176 11/11/2022   HDL 35.90 (L) 11/11/2022   LDLCALC 116 (H) 11/11/2022   LDLDIRECT 117.0 03/25/2021   TRIG 124.0 11/11/2022   CHOLHDL 5 11/11/2022   The 10-year ASCVD risk score  (Arnett DK, et al., 2019) is: 2.3%   Values used to calculate the score:     Age: 59 years     Sex: Female     Is Non-Hispanic African American: No     Diabetic: No     Tobacco smoker: No     Systolic Blood Pressure: 138 mmHg     Is BP treated: Yes     HDL Cholesterol: 35.9 mg/dL     Total Cholesterol: 176 mg/dL   Relevant past medical, surgical, family and social history reviewed and updated as indicated. Interim medical history since our last visit reviewed. Allergies and medications reviewed and updated. Outpatient Medications Prior to Visit  Medication Sig Dispense Refill   ALPRAZolam (XANAX) 0.5 MG tablet Take 1 tablet (0.5 mg total) by mouth at bedtime as needed. 30 tablet 1   Aspirin-Caffeine (BC FAST PAIN RELIEF PO) Take 1 packet by mouth daily as needed (headache).     lisinopril (ZESTRIL) 10 MG tablet TAKE 1/2 TO 1 TABLET BY MOUTH EVERY DAY 90 tablet 1   omeprazole (PRILOSEC) 20 MG capsule Take 1 capsule (20 mg total) by mouth daily. 30 capsule 5   progesterone (PROMETRIUM) 100 MG capsule Take 100-200 mg by mouth at bedtime.     sertraline (ZOLOFT) 100 MG tablet TAKE 1 AND  1/2 TABLETS(150 MG) BY MOUTH DAILY 135 tablet 0   valACYclovir (VALTREX) 500 MG tablet TAKE 2 TABLETS BY MOUTH TODAY, THEN 2 TABLETS TWICE DAILY AS NEEDED. 60 tablet 5   No facility-administered medications prior to visit.     Per HPI unless specifically indicated in ROS section below Review of Systems  Constitutional:  Negative for fatigue and fever.  HENT:  Negative for congestion.   Eyes:  Negative for pain.  Respiratory:  Negative for cough and shortness of breath.   Cardiovascular:  Negative for chest pain, palpitations and leg swelling.  Gastrointestinal:  Negative for abdominal pain.  Genitourinary:  Negative for dysuria and vaginal bleeding.  Musculoskeletal:  Negative for back pain.  Neurological:  Negative for syncope, light-headedness and headaches.  Psychiatric/Behavioral:  Negative for  dysphoric mood.    Objective:  BP 138/88   Pulse 81   Temp (!) 97.5 F (36.4 C) (Temporal)   Ht 5' 3.75" (1.619 m)   Wt 177 lb 6 oz (80.5 kg)   LMP 07/22/2022   SpO2 97%   BMI 30.69 kg/m   Wt Readings from Last 3 Encounters:  11/13/22 177 lb 6 oz (80.5 kg)  07/11/22 179 lb 8 oz (81.4 kg)  06/12/22 175 lb (79.4 kg)      Physical Exam Vitals and nursing note reviewed.  Constitutional:      General: She is not in acute distress.    Appearance: Normal appearance. She is well-developed. She is not ill-appearing or toxic-appearing.  HENT:     Head: Normocephalic.     Right Ear: Hearing, tympanic membrane, ear canal and external ear normal. Tympanic membrane is not erythematous, retracted or bulging.     Left Ear: Hearing, tympanic membrane, ear canal and external ear normal. Tympanic membrane is not erythematous, retracted or bulging.     Nose: Nose normal. No mucosal edema or rhinorrhea.     Right Sinus: No maxillary sinus tenderness or frontal sinus tenderness.     Left Sinus: No maxillary sinus tenderness or frontal sinus tenderness.     Mouth/Throat:     Pharynx: Uvula midline.  Eyes:     General: Lids are normal. Lids are everted, no foreign bodies appreciated.     Conjunctiva/sclera: Conjunctivae normal.     Pupils: Pupils are equal, round, and reactive to light.  Neck:     Thyroid: No thyroid mass or thyromegaly.     Vascular: No carotid bruit.     Trachea: Trachea normal.  Cardiovascular:     Rate and Rhythm: Normal rate and regular rhythm.     Pulses: Normal pulses.     Heart sounds: Normal heart sounds, S1 normal and S2 normal. No murmur heard.    No friction rub. No gallop.  Pulmonary:     Effort: Pulmonary effort is normal. No tachypnea or respiratory distress.     Breath sounds: Normal breath sounds. No decreased breath sounds, wheezing, rhonchi or rales.  Abdominal:     General: Bowel sounds are normal. There is no distension or abdominal bruit.      Palpations: Abdomen is soft. There is no fluid wave or mass.     Tenderness: There is no abdominal tenderness. There is no guarding or rebound.     Hernia: No hernia is present.  Genitourinary:    Exam position: Supine.     Labia:        Right: No rash, tenderness or lesion.  Left: Lesion present. No rash or tenderness.      Vagina: Normal.     Cervix: No cervical motion tenderness, discharge or friability.     Uterus: Not enlarged and not tender.      Adnexa:        Right: No mass, tenderness or fullness.         Left: No mass, tenderness or fullness.       Comments:  Small mobile cysts in vulva, no clear erythema , heat or warmth... treat with topical antibiotics oitnment and warm compress Musculoskeletal:     Right shoulder: No tenderness or bony tenderness. Normal range of motion.     Left shoulder: Tenderness present. No bony tenderness. Decreased range of motion.     Cervical back: Normal range of motion and neck supple.     Comments:  Positive empty can  Lymphadenopathy:     Cervical: No cervical adenopathy.  Skin:    General: Skin is warm and dry.     Findings: No rash.  Neurological:     Mental Status: She is alert.     Cranial Nerves: No cranial nerve deficit.     Sensory: No sensory deficit.  Psychiatric:        Mood and Affect: Mood is not anxious or depressed.        Speech: Speech normal.        Behavior: Behavior normal. Behavior is cooperative.        Thought Content: Thought content normal.        Judgment: Judgment normal.       Results for orders placed or performed in visit on 11/11/22  VITAMIN D 25 Hydroxy (Vit-D Deficiency, Fractures)  Result Value Ref Range   VITD 24.68 (L) 30.00 - 100.00 ng/mL  CBC with Differential/Platelet  Result Value Ref Range   WBC 8.8 4.0 - 10.5 K/uL   RBC 4.34 3.87 - 5.11 Mil/uL   Hemoglobin 12.9 12.0 - 15.0 g/dL   HCT 95.6 21.3 - 08.6 %   MCV 88.6 78.0 - 100.0 fl   MCHC 33.5 30.0 - 36.0 g/dL   RDW 57.8 46.9 -  62.9 %   Platelets 275.0 150.0 - 400.0 K/uL   Neutrophils Relative % 67.5 43.0 - 77.0 %   Lymphocytes Relative 22.7 12.0 - 46.0 %   Monocytes Relative 8.1 3.0 - 12.0 %   Eosinophils Relative 1.2 0.0 - 5.0 %   Basophils Relative 0.5 0.0 - 3.0 %   Neutro Abs 5.9 1.4 - 7.7 K/uL   Lymphs Abs 2.0 0.7 - 4.0 K/uL   Monocytes Absolute 0.7 0.1 - 1.0 K/uL   Eosinophils Absolute 0.1 0.0 - 0.7 K/uL   Basophils Absolute 0.0 0.0 - 0.1 K/uL  Lipid panel  Result Value Ref Range   Cholesterol 176 0 - 200 mg/dL   Triglycerides 528.4 0.0 - 149.0 mg/dL   HDL 13.24 (L) >40.10 mg/dL   VLDL 27.2 0.0 - 53.6 mg/dL   LDL Cholesterol 644 (H) 0 - 99 mg/dL   Total CHOL/HDL Ratio 5    NonHDL 140.32   Comprehensive metabolic panel  Result Value Ref Range   Sodium 140 135 - 145 mEq/L   Potassium 4.0 3.5 - 5.1 mEq/L   Chloride 104 96 - 112 mEq/L   CO2 28 19 - 32 mEq/L   Glucose, Bld 84 70 - 99 mg/dL   BUN 12 6 - 23 mg/dL   Creatinine, Ser 0.34 0.40 - 1.20  mg/dL   Total Bilirubin 0.4 0.2 - 1.2 mg/dL   Alkaline Phosphatase 33 (L) 39 - 117 U/L   AST 16 0 - 37 U/L   ALT 16 0 - 35 U/L   Total Protein 7.0 6.0 - 8.3 g/dL   Albumin 4.3 3.5 - 5.2 g/dL   GFR 16.10 >96.04 mL/min   Calcium 9.5 8.4 - 10.5 mg/dL     COVID 19 screen:  No recent travel or known exposure to COVID19 The patient denies respiratory symptoms of COVID 19 at this time. The importance of social distancing was discussed today.   Assessment and Plan The patient's preventative maintenance and recommended screening tests for an annual wellness exam were reviewed in full today. Brought up to date unless services declined.  Counselled on the importance of diet, exercise, and its role in overall health and mortality. The patient's FH and SH was reviewed, including their home life, tobacco status, and drug and alcohol status.   Vaccines: COVID x 2, Td uptodate Pap/DVE:  10/2017, repeat due now Mammo: 12/16/21 abnormal further imaging  requested/completed.. return to routine screening. Bone Density: Colon: 04/25/2021 Smoking Status: former ETOH/ drug use: none/none  Hep C:  done  HIV screen:   done   Problem List Items Addressed This Visit     Acute pain of left shoulder   Essential hypertension    Stable, chronic.  Continue current medication.   Lisinopril 10 mg daily        GAD (generalized anxiety disorder)    Chronic.  Continue current medication.   Sertraline 100 mg daily  Using alprazolam  0.5 mg prn about 2 times a month.      Primary cancer of right lower lobe of lung (HCC)    Status post wedge resection with clear margins.  Dr. Donneta Romberg oncology following with CT scan every 12 months for total of 5 years.      PVC's (premature ventricular contractions)    Saw Dr. Okey Dupre in past for this.. minimally symptomatic.      Other Visit Diagnoses     Routine general medical examination at a health care facility    -  Primary   Encounter for annual routine gynecological examination       Relevant Orders   Cytology - PAP        Merlon Alcorta Ermalene Searing, MD

## 2022-11-20 LAB — CYTOLOGY - PAP
Comment: NEGATIVE
Diagnosis: NEGATIVE
High risk HPV: NEGATIVE

## 2022-12-15 ENCOUNTER — Encounter: Payer: Self-pay | Admitting: Family Medicine

## 2022-12-15 ENCOUNTER — Ambulatory Visit: Payer: BC Managed Care – PPO | Admitting: Family Medicine

## 2022-12-15 VITALS — BP 134/78 | HR 86 | Ht 64.0 in | Wt 183.0 lb

## 2022-12-15 DIAGNOSIS — S66012A Strain of long flexor muscle, fascia and tendon of left thumb at wrist and hand level, initial encounter: Secondary | ICD-10-CM

## 2022-12-15 DIAGNOSIS — M75102 Unspecified rotator cuff tear or rupture of left shoulder, not specified as traumatic: Secondary | ICD-10-CM | POA: Diagnosis not present

## 2022-12-15 MED ORDER — DICLOFENAC SODIUM 75 MG PO TBEC
75.0000 mg | DELAYED_RELEASE_TABLET | Freq: Two times a day (BID) | ORAL | 0 refills | Status: DC
Start: 2022-12-15 — End: 2023-01-09

## 2022-12-15 NOTE — Assessment & Plan Note (Signed)
76-month history of atraumatic left lateral shoulder pain, worse with overhead, reaching back activities, denies radiation, no paresthesias.  Exam shows focality to the supraspinatus with 4/5 strength noted, provocative testing otherwise benign.  Plan as follows: - Scheduled diclofenac twice daily x 2 weeks then as needed - Home-based rehab after 1 week - Close follow-up with low threshold to consider advanced to subacromial corticosteroid injection as clinically guided

## 2022-12-15 NOTE — Progress Notes (Signed)
     Primary Care / Sports Medicine Office Visit  Patient Information:  Patient ID: Catherine Gilbert, female DOB: 1975/12/02 Age: 47 y.o. MRN: [id number]   Catherine Gilbert is a pleasant 47 y.o. female presenting with the following:  Chief Complaint  Patient presents with   Shoulder Pain    Nothing to cause pain- no hx of injections. No xrays- hurting 2 months- tried Ibuprofen and tylenol- some relief   thumb pain    L) thumb- has had 2 injections in thumb- last was 3 years ago. No recent xrays.    Vitals:   12/15/22 1519  BP: 134/78  Pulse: 86  SpO2: 98%   Vitals:   12/15/22 1519  Weight: 183 lb (83 kg)  Height: 5\' 4"  (1.626 m)   Body mass index is 31.41 kg/m.  No results found.   Independent interpretation of notes and tests performed by another provider:   None  Procedures performed:   None  Pertinent History, Exam, Impression, and Recommendations:   Sheridan was seen today for shoulder pain and thumb pain.  Rotator cuff syndrome, left Assessment & Plan: 27-month history of atraumatic left lateral shoulder pain, worse with overhead, reaching back activities, denies radiation, no paresthesias.  Exam shows focality to the supraspinatus with 4/5 strength noted, provocative testing otherwise benign.  Plan as follows: - Scheduled diclofenac twice daily x 2 weeks then as needed - Home-based rehab after 1 week - Close follow-up with low threshold to consider advanced to subacromial corticosteroid injection as clinically guided  Orders: -     Diclofenac Sodium; Take 1 tablet (75 mg total) by mouth 2 (two) times daily. X 2 weeks then twice daily as-needed  Dispense: 60 tablet; Refill: 0  Strain of long flexor muscle, fascia and tendon of left thumb at wrist and hand level, initial encounter Assessment & Plan: Right-hand-dominant patient presenting with acute on chronic left volar MCP thumb pain.  Denies any radiation of symptoms, did have increase in activity at onset  (multiple balloon declarations).  Exam localizes to the long flexor tendon over the MCP without overt triggering.  Otherwise sensorimotor and provocative testing benign at the hand and wrist.  Plan as follows: - Thumb spica brace throughout the day, okay to remove at nighttime - Diclofenac twice daily x 2 weeks then twice daily as needed - Home exercises after 1 week - Follow-up in 4 weeks - Suboptimal progress can be addressed with consideration of corticosteroid injections, imaging, all as clinically guided  Orders: -     Diclofenac Sodium; Take 1 tablet (75 mg total) by mouth 2 (two) times daily. X 2 weeks then twice daily as-needed  Dispense: 60 tablet; Refill: 0     Orders & Medications Meds ordered this encounter  Medications   diclofenac (VOLTAREN) 75 MG EC tablet    Sig: Take 1 tablet (75 mg total) by mouth 2 (two) times daily. X 2 weeks then twice daily as-needed    Dispense:  60 tablet    Refill:  0   No orders of the defined types were placed in this encounter.    No follow-ups on file.     Jerrol Banana, MD, [facility]   Primary Care Sports Medicine Primary Care and Sports Medicine at [facility]

## 2022-12-15 NOTE — Patient Instructions (Signed)
-   Thumb spica brace throughout the day, okay to remove at nighttime - Diclofenac twice daily x 2 weeks then twice daily as needed - Home exercises after 1 week for thumb and shoulder - Follow-up in 4 weeks

## 2022-12-15 NOTE — Assessment & Plan Note (Signed)
Right-hand-dominant patient presenting with acute on chronic left volar MCP thumb pain.  Denies any radiation of symptoms, did have increase in activity at onset (multiple balloon declarations).  Exam localizes to the long flexor tendon over the MCP without overt triggering.  Otherwise sensorimotor and provocative testing benign at the hand and wrist.  Plan as follows: - Thumb spica brace throughout the day, okay to remove at nighttime - Diclofenac twice daily x 2 weeks then twice daily as needed - Home exercises after 1 week - Follow-up in 4 weeks - Suboptimal progress can be addressed with consideration of corticosteroid injections, imaging, all as clinically guided

## 2022-12-23 ENCOUNTER — Other Ambulatory Visit: Payer: Self-pay | Admitting: Family Medicine

## 2022-12-23 DIAGNOSIS — F411 Generalized anxiety disorder: Secondary | ICD-10-CM

## 2022-12-24 ENCOUNTER — Other Ambulatory Visit: Payer: Self-pay | Admitting: Family Medicine

## 2022-12-24 DIAGNOSIS — Z1231 Encounter for screening mammogram for malignant neoplasm of breast: Secondary | ICD-10-CM

## 2022-12-24 MED ORDER — SERTRALINE HCL 100 MG PO TABS
ORAL_TABLET | ORAL | 1 refills | Status: DC
Start: 2022-12-24 — End: 2023-06-16

## 2022-12-24 NOTE — Telephone Encounter (Signed)
MyChart message sent to patient and see confirmed she is taking 1 1/2 tablets daily of Sertraline 100 mg.   This is a duplicate request so I will deny this request but refill has been approved on MyChart encounter.

## 2022-12-24 NOTE — Telephone Encounter (Signed)
Last office visit 11/13/2022 for CPE.  Office note states Sertraline 100 mg daily but medication list and request from pharmacy is for 1 1/2 tablets daily (150 mg).  Please advise.

## 2023-01-05 ENCOUNTER — Other Ambulatory Visit: Payer: Self-pay | Admitting: Family Medicine

## 2023-01-09 ENCOUNTER — Other Ambulatory Visit (INDEPENDENT_AMBULATORY_CARE_PROVIDER_SITE_OTHER): Payer: BC Managed Care – PPO | Admitting: Radiology

## 2023-01-09 ENCOUNTER — Encounter: Payer: Self-pay | Admitting: Family Medicine

## 2023-01-09 ENCOUNTER — Ambulatory Visit: Payer: BC Managed Care – PPO | Admitting: Family Medicine

## 2023-01-09 VITALS — BP 126/68 | HR 84 | Ht 64.0 in | Wt 182.0 lb

## 2023-01-09 DIAGNOSIS — S66012A Strain of long flexor muscle, fascia and tendon of left thumb at wrist and hand level, initial encounter: Secondary | ICD-10-CM

## 2023-01-09 DIAGNOSIS — M75102 Unspecified rotator cuff tear or rupture of left shoulder, not specified as traumatic: Secondary | ICD-10-CM

## 2023-01-09 MED ORDER — TRIAMCINOLONE ACETONIDE 40 MG/ML IJ SUSP
60.0000 mg | Freq: Once | INTRAMUSCULAR | Status: AC
Start: 2023-01-09 — End: 2023-01-09
  Administered 2023-01-09: 60 mg via INTRAMUSCULAR

## 2023-01-09 MED ORDER — DICLOFENAC SODIUM 75 MG PO TBEC: 75.0000 mg | DELAYED_RELEASE_TABLET | Freq: Two times a day (BID) | ORAL | 1 refills | Status: DC | PRN

## 2023-01-09 NOTE — Assessment & Plan Note (Signed)
Acute on chronic condition, interval improved following diclofenac and thumb spica brace.  Exam today shows persistent focality to the flexor tendon across the left first MCP.  Given residual symptoms though interval progress we reviewed additional treatment strategies and patient elected to proceed with ultrasound-guided peritendinous sheath injection of cortisone.  Plan as follows: - Cortisone administered today - Continue thumb spica brace x 2 days and gradually wean from brace - Start home exercises after 1 week and continue until symptom-free - Can contact us at the 2-4-week mark or beyond for any persistent symptoms, at that time can consider advanced imaging, modification of pharmacotherapy, immobilization

## 2023-01-09 NOTE — Assessment & Plan Note (Signed)
Patient returns for left rotator cuff related impingement localizing to the supraspinatus.  Today's examination demonstrates 5/5 nearly painless strength, mild symptoms, equivocal impingement.  Overall improved examination.  Given her improvements though persistent symptoms we reviewed treatment options and patient elected to proceed with subacromial corticosteroid injection under ultrasound guidance.  Plan as follows: - Cortisone injection today - Transition to diclofenac as needed - Perform home exercise next week and continue on a regular basis - Can contact us at the 2-4-week mark and beyond for any persistent symptoms, if noted advanced imaging/formal PT to be considered

## 2023-01-09 NOTE — Progress Notes (Signed)
     Primary Care / Sports Medicine Office Visit  Patient Information:  Patient ID: Catherine Gilbert, female DOB: 1975/07/07 Age: 47 y.o. MRN: [id number]   Catherine Gilbert is a pleasant 47 y.o. female presenting with the following:  Chief Complaint  Patient presents with   Shoulder Pain    Better, able to lift further, has a dull ache in bone    Vitals:   01/09/23 1531  BP: 126/68  Pulse: 84  SpO2: 96%   Vitals:   01/09/23 1531  Weight: 182 lb (82.6 kg)  Height: 5\' 4"  (1.626 m)   Body mass index is 31.24 kg/m.  No results found.   Independent interpretation of notes and tests performed by another provider:   None  Procedures performed:   Procedure:  Injection of left subacromial space under ultrasound guidance. Ultrasound guidance utilized for in-plane approach to left subacromial space, Perry tenderness hypoechoic region consistent with fluid noted Samsung HS60 device utilized with permanent recording / reporting. Verbal informed consent obtained and verified. Skin prepped in a sterile fashion. Ethyl chloride for topical local analgesia.  Completed without difficulty and tolerated well. Medication: triamcinolone acetonide 40 mg/mL suspension for injection 1 mL total and 2 mL lidocaine 1% without epinephrine utilized for needle placement anesthetic Advised to contact for fevers/chills, erythema, induration, drainage, or persistent bleeding.  Procedure:  Injection of left first flexor tendon sheath under ultrasound guidance. Ultrasound guidance utilized for out of plane approach to left first digit flexor tendon sheath, dynamic motion visualized, subtle tendon thickening just superficial to the first MCP noted Samsung HS60 device utilized with permanent recording / reporting. Verbal informed consent obtained and verified. Skin prepped in a sterile fashion. Ethyl chloride for topical local analgesia.  Completed without difficulty and tolerated well. Medication:  triamcinolone acetonide 40 mg/mL suspension for injection 0.5 mL total and 0.5 mL lidocaine 1% without epinephrine utilized for needle placement anesthetic Advised to contact for fevers/chills, erythema, induration, drainage, or persistent bleeding.   Pertinent History, Exam, Impression, and Recommendations:   Catherine Gilbert was seen today for shoulder pain.  Rotator cuff syndrome, left -     [place] LIMITED JOINT SPACE STRUCTURES UP LEFT; Future -     Diclofenac Sodium; Take 1 tablet (75 mg total) by mouth 2 (two) times daily as needed.  Dispense: 60 tablet; Refill: 1 -     Triamcinolone Acetonide  Strain of long flexor muscle, fascia and tendon of left thumb at wrist and hand level, initial encounter -     [place] LIMITED JOINT SPACE STRUCTURES UP LEFT; Future -     Diclofenac Sodium; Take 1 tablet (75 mg total) by mouth 2 (two) times daily as needed.  Dispense: 60 tablet; Refill: 1 -     Triamcinolone Acetonide     Orders & Medications Meds ordered this encounter  Medications   diclofenac (VOLTAREN) 75 MG EC tablet    Sig: Take 1 tablet (75 mg total) by mouth 2 (two) times daily as needed.    Dispense:  60 tablet    Refill:  1   triamcinolone acetonide (KENALOG-40) injection 60 mg   Orders Placed This Encounter  Procedures   [place] LIMITED JOINT SPACE STRUCTURES UP LEFT     Return if symptoms worsen or fail to improve.     Jerrol Banana, MD, [facility]   Primary Care Sports Medicine Primary Care and Sports Medicine at [facility]

## 2023-01-09 NOTE — Patient Instructions (Addendum)
You have just been given a cortisone injection to reduce pain and inflammation. After the injection you may notice immediate relief of pain as a result of the Lidocaine. It is important to rest the area of the injection for 24 to 48 hours after the injection. There is a possibility of some temporary increased discomfort and swelling for up to 72 hours until the cortisone begins to work. If you do have pain, simply rest the joint and use ice. If you can tolerate over the counter medications, you can try Tylenol, Aleve, or Advil for added relief per package instructions. - As above, relative rest x 2 days then gradually return to normal activity - Use thumb brace x 2 days then gradually discontinue - Start home exercises next week - Contact us at 2-4 weeks or beyond if symptoms persist without improvement - Otherwise, follow-up as-needed

## 2023-01-12 ENCOUNTER — Ambulatory Visit: Payer: BC Managed Care – PPO | Admitting: Family Medicine

## 2023-01-29 ENCOUNTER — Ambulatory Visit
Admission: RE | Admit: 2023-01-29 | Discharge: 2023-01-29 | Disposition: A | Discharge status: Home / Self Care | Payer: BC Managed Care – PPO | Source: Ambulatory Visit | Attending: Family Medicine | Admitting: Family Medicine

## 2023-01-29 DIAGNOSIS — Z1231 Encounter for screening mammogram for malignant neoplasm of breast: Secondary | ICD-10-CM | POA: Insufficient documentation

## 2023-01-30 DIAGNOSIS — D2111 Benign neoplasm of connective and other soft tissue of right upper limb, including shoulder: Secondary | ICD-10-CM | POA: Diagnosis not present

## 2023-01-30 DIAGNOSIS — D485 Neoplasm of uncertain behavior of skin: Secondary | ICD-10-CM | POA: Diagnosis not present

## 2023-02-17 DIAGNOSIS — M955 Acquired deformity of pelvis: Secondary | ICD-10-CM | POA: Diagnosis not present

## 2023-02-17 DIAGNOSIS — M9905 Segmental and somatic dysfunction of pelvic region: Secondary | ICD-10-CM | POA: Diagnosis not present

## 2023-02-17 DIAGNOSIS — M5441 Lumbago with sciatica, right side: Secondary | ICD-10-CM | POA: Diagnosis not present

## 2023-02-17 DIAGNOSIS — M9903 Segmental and somatic dysfunction of lumbar region: Secondary | ICD-10-CM | POA: Diagnosis not present

## 2023-02-19 DIAGNOSIS — M955 Acquired deformity of pelvis: Secondary | ICD-10-CM | POA: Diagnosis not present

## 2023-02-19 DIAGNOSIS — M9903 Segmental and somatic dysfunction of lumbar region: Secondary | ICD-10-CM | POA: Diagnosis not present

## 2023-02-19 DIAGNOSIS — M5441 Lumbago with sciatica, right side: Secondary | ICD-10-CM | POA: Diagnosis not present

## 2023-02-19 DIAGNOSIS — M9905 Segmental and somatic dysfunction of pelvic region: Secondary | ICD-10-CM | POA: Diagnosis not present

## 2023-02-26 DIAGNOSIS — M9903 Segmental and somatic dysfunction of lumbar region: Secondary | ICD-10-CM | POA: Diagnosis not present

## 2023-02-26 DIAGNOSIS — M955 Acquired deformity of pelvis: Secondary | ICD-10-CM | POA: Diagnosis not present

## 2023-02-26 DIAGNOSIS — M5441 Lumbago with sciatica, right side: Secondary | ICD-10-CM | POA: Diagnosis not present

## 2023-02-26 DIAGNOSIS — M9905 Segmental and somatic dysfunction of pelvic region: Secondary | ICD-10-CM | POA: Diagnosis not present

## 2023-03-09 DIAGNOSIS — M9903 Segmental and somatic dysfunction of lumbar region: Secondary | ICD-10-CM | POA: Diagnosis not present

## 2023-03-09 DIAGNOSIS — M955 Acquired deformity of pelvis: Secondary | ICD-10-CM | POA: Diagnosis not present

## 2023-03-09 DIAGNOSIS — M9905 Segmental and somatic dysfunction of pelvic region: Secondary | ICD-10-CM | POA: Diagnosis not present

## 2023-03-09 DIAGNOSIS — M5441 Lumbago with sciatica, right side: Secondary | ICD-10-CM | POA: Diagnosis not present

## 2023-03-18 DIAGNOSIS — M9905 Segmental and somatic dysfunction of pelvic region: Secondary | ICD-10-CM | POA: Diagnosis not present

## 2023-03-18 DIAGNOSIS — M5441 Lumbago with sciatica, right side: Secondary | ICD-10-CM | POA: Diagnosis not present

## 2023-03-18 DIAGNOSIS — M9903 Segmental and somatic dysfunction of lumbar region: Secondary | ICD-10-CM | POA: Diagnosis not present

## 2023-03-18 DIAGNOSIS — M955 Acquired deformity of pelvis: Secondary | ICD-10-CM | POA: Diagnosis not present

## 2023-03-24 DIAGNOSIS — M5441 Lumbago with sciatica, right side: Secondary | ICD-10-CM | POA: Diagnosis not present

## 2023-03-24 DIAGNOSIS — M9903 Segmental and somatic dysfunction of lumbar region: Secondary | ICD-10-CM | POA: Diagnosis not present

## 2023-03-24 DIAGNOSIS — M9905 Segmental and somatic dysfunction of pelvic region: Secondary | ICD-10-CM | POA: Diagnosis not present

## 2023-03-24 DIAGNOSIS — M955 Acquired deformity of pelvis: Secondary | ICD-10-CM | POA: Diagnosis not present

## 2023-05-06 ENCOUNTER — Ambulatory Visit: Payer: BC Managed Care – PPO | Admitting: Internal Medicine

## 2023-05-06 ENCOUNTER — Other Ambulatory Visit: Payer: BC Managed Care – PPO

## 2023-05-11 ENCOUNTER — Other Ambulatory Visit: Payer: Self-pay | Admitting: *Deleted

## 2023-05-11 DIAGNOSIS — C3431 Malignant neoplasm of lower lobe, right bronchus or lung: Secondary | ICD-10-CM

## 2023-05-12 ENCOUNTER — Other Ambulatory Visit: Payer: Self-pay

## 2023-05-12 ENCOUNTER — Encounter: Payer: Self-pay | Admitting: Nurse Practitioner

## 2023-05-12 ENCOUNTER — Inpatient Hospital Stay: Payer: BC Managed Care – PPO | Attending: Internal Medicine | Admitting: Nurse Practitioner

## 2023-05-12 ENCOUNTER — Inpatient Hospital Stay: Payer: BC Managed Care – PPO

## 2023-05-12 VITALS — BP 160/80 | HR 85 | Temp 96.7°F | Resp 18 | Ht 64.0 in

## 2023-05-12 DIAGNOSIS — C3431 Malignant neoplasm of lower lobe, right bronchus or lung: Secondary | ICD-10-CM | POA: Diagnosis not present

## 2023-05-12 DIAGNOSIS — Z9851 Tubal ligation status: Secondary | ICD-10-CM | POA: Insufficient documentation

## 2023-05-12 DIAGNOSIS — F411 Generalized anxiety disorder: Secondary | ICD-10-CM | POA: Insufficient documentation

## 2023-05-12 DIAGNOSIS — E559 Vitamin D deficiency, unspecified: Secondary | ICD-10-CM | POA: Diagnosis not present

## 2023-05-12 DIAGNOSIS — I10 Essential (primary) hypertension: Secondary | ICD-10-CM | POA: Insufficient documentation

## 2023-05-12 DIAGNOSIS — Z87891 Personal history of nicotine dependence: Secondary | ICD-10-CM | POA: Insufficient documentation

## 2023-05-12 DIAGNOSIS — Z85118 Personal history of other malignant neoplasm of bronchus and lung: Secondary | ICD-10-CM | POA: Diagnosis not present

## 2023-05-12 DIAGNOSIS — Z79899 Other long term (current) drug therapy: Secondary | ICD-10-CM | POA: Diagnosis not present

## 2023-05-12 DIAGNOSIS — Z8249 Family history of ischemic heart disease and other diseases of the circulatory system: Secondary | ICD-10-CM | POA: Insufficient documentation

## 2023-05-12 DIAGNOSIS — Z08 Encounter for follow-up examination after completed treatment for malignant neoplasm: Secondary | ICD-10-CM

## 2023-05-12 DIAGNOSIS — Z803 Family history of malignant neoplasm of breast: Secondary | ICD-10-CM | POA: Insufficient documentation

## 2023-05-12 DIAGNOSIS — Z823 Family history of stroke: Secondary | ICD-10-CM | POA: Diagnosis not present

## 2023-05-12 LAB — CBC WITH DIFFERENTIAL (CANCER CENTER ONLY)
Abs Immature Granulocytes: 0.05 10*3/uL (ref 0.00–0.07)
Basophils Absolute: 0 10*3/uL (ref 0.0–0.1)
Basophils Relative: 0 %
Eosinophils Absolute: 0.1 10*3/uL (ref 0.0–0.5)
Eosinophils Relative: 1 %
HCT: 36 % (ref 36.0–46.0)
Hemoglobin: 12 g/dL (ref 12.0–15.0)
Immature Granulocytes: 1 %
Lymphocytes Relative: 28 %
Lymphs Abs: 2.5 10*3/uL (ref 0.7–4.0)
MCH: 30.1 pg (ref 26.0–34.0)
MCHC: 33.3 g/dL (ref 30.0–36.0)
MCV: 90.2 fL (ref 80.0–100.0)
Monocytes Absolute: 0.7 10*3/uL (ref 0.1–1.0)
Monocytes Relative: 8 %
Neutro Abs: 5.6 10*3/uL (ref 1.7–7.7)
Neutrophils Relative %: 62 %
Platelet Count: 235 10*3/uL (ref 150–400)
RBC: 3.99 MIL/uL (ref 3.87–5.11)
RDW: 11.9 % (ref 11.5–15.5)
WBC Count: 9 10*3/uL (ref 4.0–10.5)
nRBC: 0 % (ref 0.0–0.2)

## 2023-05-12 LAB — CMP (CANCER CENTER ONLY)
ALT: 22 U/L (ref 0–44)
AST: 23 U/L (ref 15–41)
Albumin: 4.3 g/dL (ref 3.5–5.0)
Alkaline Phosphatase: 38 U/L (ref 38–126)
Anion gap: 8 (ref 5–15)
BUN: 13 mg/dL (ref 6–20)
CO2: 26 mmol/L (ref 22–32)
Calcium: 9.1 mg/dL (ref 8.9–10.3)
Chloride: 103 mmol/L (ref 98–111)
Creatinine: 0.93 mg/dL (ref 0.44–1.00)
GFR, Estimated: >60 mL/min (ref >60)
Glucose, Bld: 121 mg/dL — ABNORMAL HIGH (ref 70–99)
Potassium: 4 mmol/L (ref 3.5–5.1)
Sodium: 137 mmol/L (ref 135–145)
Total Bilirubin: 0.6 mg/dL (ref <1.2)
Total Protein: 7.1 g/dL (ref 6.5–8.1)

## 2023-05-12 NOTE — Progress Notes (Signed)
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  #Quit smoking [2017]  DIAGNOSIS: Right upper lobe lung cancer  STAGE: 1       ;GOALS: Cure  CURRENT/MOST RECENT THERAPY : Surveillance    Primary cancer of right lower lobe of lung (HCC)  04/07/2018 Initial Diagnosis   Primary cancer of right lower lobe of lung (HCC)      HISTORY OF PRESENTING ILLNESS: Ambulating independently.  Accompanied by husband. Catherine Gilbert 47 y.o.  female history of stage I adenocarcinoma the lung is here for follow-up. Patient denies any worsening shortness of breath or cough.  No chest pain.  No headaches. No bone pain. She remains a former smoker.    Review of Systems  Constitutional:  Negative for chills, diaphoresis, fever, malaise/fatigue and weight loss.  HENT:  Negative for nosebleeds and sore throat.   Eyes:  Negative for double vision.  Respiratory:  Negative for cough, hemoptysis, sputum production, shortness of breath and wheezing.   Cardiovascular:  Negative for chest pain, palpitations, orthopnea and leg swelling.  Gastrointestinal:  Negative for abdominal pain, blood in stool, constipation, diarrhea, heartburn, melena, nausea and vomiting.  Genitourinary:  Negative for dysuria, frequency and urgency.  Musculoskeletal:  Negative for back pain and joint pain.  Skin: Negative.  Negative for itching and rash.  Neurological:  Negative for dizziness, tingling, focal weakness, weakness  and headaches.  Endo/Heme/Allergies:  Does not bruise/bleed easily.  Psychiatric/Behavioral:  Negative for depression. The patient is not nervous/anxious and does not have insomnia.      MEDICAL HISTORY:  Past Medical History:  Diagnosis Date   Abnormal Pap smear    Anemia    Anxiety    Anxiety state 04/23/2011   Cancer (HCC) 02/2018   lung rt   Depression    Dysmenorrhea 08/27/2011   Dyspnea on exertion 10/30/2015   Essential hypertension 09/15/2012   Family history of ASCVD (arteriosclerotic cardiovascular disease) 12/31/2016   Family history of premature CAD 11/11/2016   GAD (generalized anxiety disorder) 10/30/2015   GERD (gastroesophageal reflux disease) 05/29/2014   Headache 01/01/2017   HSV (herpes simplex virus) infection    Hypertension    Initiation of Depo Provera 09/24/2011   Lung nodule    RLL on coronary calcium score CT   Migraine headache     SURGICAL HISTORY: Past Surgical History:  Procedure Laterality Date   BREAST CYST ASPIRATION Right 02/10/2018   neg   CESAREAN SECTION     x 3    COLONOSCOPY WITH PROPOFOL N/A 04/25/2021   Procedure: COLONOSCOPY WITH PROPOFOL;  Surgeon: Toney Reil, MD;  Location: [facility];  Service: Endoscopy;  Laterality: N/A;   COLPOSCOPY  2010   DILATION AND CURETTAGE OF UTERUS     ESOPHAGOGASTRODUODENOSCOPY (EGD) WITH PROPOFOL N/A 09/20/2021   Procedure: ESOPHAGOGASTRODUODENOSCOPY (EGD) WITH PROPOFOL;  Surgeon: Toney Reil, MD;  Location: [facility] ENDOSCOPY;  Service: Gastroenterology;  Laterality: N/A;   FLEXIBLE BRONCHOSCOPY N/A 03/29/2018   Procedure: PREOP  BRONCHOSCOPY;  Surgeon: Hulda Marin, MD;  Location: [facility] ORS;  Service: Thoracic;  Laterality: N/A;   HYSTEROSCOPY WITH D & C N/A 01/12/2020   Procedure: DILATATION AND CURETTAGE /HYSTEROSCOPY;  Surgeon: Natale Milch, MD;  Location: [facility] ORS;  Service: Gynecology;  Laterality: N/A;   POLYPECTOMY  04/25/2021   Procedure: POLYPECTOMY;  Surgeon: Toney Reil, MD;  Location[facility];  Service: Endoscopy;;   THORACOTOMY Right 03/29/2018   Procedure: THORACOTOMY MAJOR- POSSIBLE LOBECTOMY;  Surgeon: Hulda Marin, MD;  Location: [facility] ORS;  Service: Thoracic;  Laterality: Right;   TUBAL LIGATION     VIDEO ASSISTED THORACOSCOPY (VATS)/WEDGE RESECTION Right 03/29/2018   Procedure: VIDEO ASSISTED THORACOSCOPY (VATS)/WEDGE RESECTION;  Surgeon: Hulda Marin, MD;  Location: [facility] ORS;  Service: Thoracic;  Laterality: Right;    SOCIAL HISTORY: lives in [place]; 3 children; quit smoking in may 2017 [since 15 until 1ppd].  Social History   Socioeconomic History   Marital status: Married    Spouse name: Fredrik Cove   Number of children: 3   Years of education: High school   Highest education level: Not on file  Occupational History   Not on file  Tobacco Use   Smoking status: Former    Current packs/day: 0.00    Average packs/day: 1 pack/day for 25.0 years (25.0 ttl pk-yrs)    Types: Cigarettes    Start date: 11/22/1990    Quit date: 11/22/2015    Years since quitting: 7.4   Smokeless tobacco: Never  Vaping Use   Vaping status: Some Days   Substances: THC  Substance and Sexual Activity   Alcohol use: Yes    Comment: rare   Drug use: Yes    Types: Marijuana    Comment: occ   Sexual activity: Yes    Birth control/protection: Surgical    Comment: tubal ligation  Other Topics Concern   Not on file  Social History Narrative   10/13/19   From: the area   Living: Husband, Roger (2000) and 3 children   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      Family: not close with family that are living, but good relationship with children      Enjoys: binge watch TV, crystals - metaphysical studies      Exercise: not currently   Diet: pretty good, lunch - salads, home cooking most of the time      Safety   Seat belts: Yes    Guns: Yes  and secure   Safe in relationships: Yes    Social Determinants of  Health   Financial Resource Strain: Not on file  Food Insecurity: Not on file  Transportation Needs: Not on file  Physical Activity: Not on file  Stress: Not on file  Social Connections: Not on file  Intimate Partner Violence: Not on file    FAMILY HISTORY: Family History  Problem Relation Age of Onset   Stroke Mother 70       massive stroke   Heart disease Father 34       congestive heart failure   Hypertension Father    Heart disease Brother 48       Suspected MI and SCD   Heart disease Sister    Stroke Sister 4   Breast cancer Maternal Grandmother 24   Breast cancer Cousin        x2 paternal, both alive    ALLERGIES:  has No Known Allergies.  MEDICATIONS:  Current Outpatient Medications  Medication Sig Dispense Refill   ALPRAZolam (XANAX) 0.5  MG tablet Take 1 tablet (0.5 mg total) by mouth at bedtime as needed. 30 tablet 1   Cholecalciferol (VITAMIN D3) 1.25 MG (50000 UT) CAPS Take 1 capsule (1.25 mg total) by mouth once a week. 12 capsule 0   diclofenac (VOLTAREN) 75 MG EC tablet Take 1 tablet (75 mg total) by mouth 2 (two) times daily as needed. 60 tablet 1   lisinopril (ZESTRIL) 10 MG tablet TAKE 1/2 TO 1 TABLET BY MOUTH EVERY DAY 90 tablet 3   omeprazole (PRILOSEC) 20 MG capsule Take 1 capsule (20 mg total) by mouth daily. 30 capsule 5   progesterone (PROMETRIUM) 100 MG capsule Take 100-200 mg by mouth at bedtime.     sertraline (ZOLOFT) 100 MG tablet TAKE 1 AND 1/2 TABLETS(150 MG) BY MOUTH DAILY 135 tablet 1   valACYclovir (VALTREX) 500 MG tablet TAKE 2 TABLETS BY MOUTH TODAY, THEN 2 TABLETS TWICE DAILY AS NEEDED. 60 tablet 5   No current facility-administered medications for this visit.     PHYSICAL EXAMINATION: ECOG PERFORMANCE STATUS: 0 - Asymptomatic Vitals:   05/12/23 1530 05/12/23 1541  BP: (!) 158/94 (!) 160/80  Pulse: 85 85  Resp: 18   Temp: (!) 96.7 F (35.9 C)    There were no vitals filed for this visit.  Physical Exam Constitutional:       Appearance: She is not ill-appearing.     Comments: Alone.  Ambulating independently.  HENT:     Head: Normocephalic and atraumatic.  Pulmonary:     Effort: Pulmonary effort is normal. No respiratory distress.     Breath sounds: No wheezing.  Abdominal:     General: There is no distension.     Palpations: Abdomen is soft.     Tenderness: There is no guarding.  Musculoskeletal:        General: No tenderness.  Skin:    General: Skin is warm.     Coloration: Skin is not pale.  Neurological:     Mental Status: She is alert and oriented to person, place, and time.  Psychiatric:        Mood and Affect: Mood and affect normal.        Behavior: Behavior normal.    LABORATORY DATA:  I have reviewed the data as listed Lab Results  Component Value Date   WBC 8.8 11/11/2022   HGB 12.9 11/11/2022   HCT 38.4 11/11/2022   MCV 88.6 11/11/2022   PLT 275.0 11/11/2022   Recent Labs    11/11/22 0917  NA 140  K 4.0  CL 104  CO2 28  GLUCOSE 84  BUN 12  CREATININE 0.93  CALCIUM 9.5  PROT 7.0  ALBUMIN 4.3  AST 16  ALT 16  ALKPHOS 33*  BILITOT 0.4    RADIOGRAPHIC STUDIES: I have personally reviewed the radiological images as listed and agreed with the findings in the report. No results found.   ASSESSMENT & PLAN:   # Stage I adenocarcinoma the lung/lipidic pattern. Diagnosed October 2019. Status post wedge resection with clear margins. CT scan October 2021- negative for any recurrence. Mild thickening noted along the staple line/resection. Repeat imagine 04/2022 was negative. Plan for one additional CT scan which we'll order today. If stable transition to low dose non-contrast enhanced chest ct annually.   # Quit smoking- congratulated on continued smoking cessation. Encouraged abstaining from smoking marijuana, cigars, and use of vape pens as well.   # Follow up care- recommend ongoing follow up with PCP  for health promotion and wellness as well as other cancer screenings.  Encourage vaccinations including flu, pneumonia, and covid. Encourage limiting intake of alcohol.    # vit D def- this can be managed by PCP.   # Hypertension- encouraged home monitoring and if elevated see pcp for management    Disposition:  # CT chest  # 12 months- see me for lung cancer surveillance- [place]  No problem-specific Assessment & Plan notes found for this encounter.  All questions were answered. The patient knows to call the clinic with any problems, questions or concerns.  Alinda Dooms, NP 05/12/2023

## 2023-05-21 ENCOUNTER — Ambulatory Visit: Payer: BC Managed Care – PPO

## 2023-06-05 ENCOUNTER — Ambulatory Visit
Admission: RE | Admit: 2023-06-05 | Discharge: 2023-06-05 | Disposition: A | Discharge status: Home / Self Care | Payer: BC Managed Care – PPO | Source: Ambulatory Visit | Attending: Nurse Practitioner

## 2023-06-05 DIAGNOSIS — Z85118 Personal history of other malignant neoplasm of bronchus and lung: Secondary | ICD-10-CM | POA: Diagnosis not present

## 2023-06-05 DIAGNOSIS — I700 Atherosclerosis of aorta: Secondary | ICD-10-CM | POA: Diagnosis not present

## 2023-06-05 DIAGNOSIS — C3431 Malignant neoplasm of lower lobe, right bronchus or lung: Secondary | ICD-10-CM | POA: Diagnosis not present

## 2023-06-05 DIAGNOSIS — Z08 Encounter for follow-up examination after completed treatment for malignant neoplasm: Secondary | ICD-10-CM | POA: Diagnosis not present

## 2023-06-05 MED ORDER — IOHEXOL 300 MG/ML  SOLN
75.0000 mL | Freq: Once | INTRAMUSCULAR | Status: AC | PRN
  Administered 2023-06-05: 75 mL via INTRAVENOUS

## 2023-06-15 ENCOUNTER — Ambulatory Visit: Payer: BC Managed Care – PPO | Admitting: Family Medicine

## 2023-06-15 ENCOUNTER — Other Ambulatory Visit (INDEPENDENT_AMBULATORY_CARE_PROVIDER_SITE_OTHER): Payer: BC Managed Care – PPO | Admitting: Radiology

## 2023-06-15 ENCOUNTER — Encounter: Payer: Self-pay | Admitting: Family Medicine

## 2023-06-15 VITALS — BP 124/88 | HR 76 | Ht 64.0 in | Wt 182.0 lb

## 2023-06-15 DIAGNOSIS — M75102 Unspecified rotator cuff tear or rupture of left shoulder, not specified as traumatic: Secondary | ICD-10-CM

## 2023-06-15 MED ORDER — DICLOFENAC SODIUM 75 MG PO TBEC
75.0000 mg | DELAYED_RELEASE_TABLET | Freq: Two times a day (BID) | ORAL | 0 refills | Status: DC | PRN
Start: 2023-06-15 — End: 2023-10-26

## 2023-06-15 MED ORDER — TRIAMCINOLONE ACETONIDE 40 MG/ML IJ SUSP
40.0000 mg | Freq: Once | INTRAMUSCULAR | Status: AC
Start: 2023-06-15 — End: 2023-06-15
  Administered 2023-06-15: 40 mg via INTRAMUSCULAR

## 2023-06-15 NOTE — Patient Instructions (Addendum)
You have just been given a cortisone injection to reduce pain and inflammation. After the injection you may notice immediate relief of pain as a result of the Lidocaine. It is important to rest the area of the injection for 24 to 48 hours after the injection. There is a possibility of some temporary increased discomfort and swelling for up to 72 hours until the cortisone begins to work. If you do have pain, simply rest the joint and use ice. If you can tolerate over the counter medications, you can try Tylenol, Aleve, or Advil for added relief per package instructions. - Pain Management: Diclofenac prescribed, use as needed up to twice a day (take with food). - Long-term Management: Perform home exercises for rotator cuff strengthening using materials provided. - Follow-up:    - Consider MRI if symptoms recur after treatment.   - Message [place] if symptoms return for further management planning.

## 2023-06-15 NOTE — Progress Notes (Signed)
Primary Care / Sports Medicine Office Visit  Patient Information:  Patient ID: Catherine Gilbert, female DOB: 1976/05/25 Age: 47 y.o. MRN: [id number]   Catherine Gilbert is a pleasant 47 y.o. female presenting with the following:  Chief Complaint  Patient presents with   Shoulder Pain    Patient would like to get another corticosteroid injection in her left shoulder.     Vitals:   06/15/23 1433  BP: 124/88  Pulse: 76  SpO2: 97%   Vitals:   06/15/23 1433  Weight: 182 lb (82.6 kg)  Height: 5\' 4"  (1.626 m)   Body mass index is 31.24 kg/m.  [place] LIMITED JOINT SPACE STRUCTURES UP LEFT Result Date: 06/15/2023 Procedure:  Injection of left shoulder subacromial under ultrasound guidance. Ultrasound guidance utilized for in-plane approach to left subacromial space, visualized supraspinatus without overt sonographic abnormality Samsung HS60 device utilized with permanent recording / reporting. Verbal informed consent obtained and verified. Skin prepped in a sterile fashion. Ethyl chloride for topical local analgesia. Completed without difficulty and tolerated well. Medication: triamcinolone acetonide 40 mg/mL suspension for injection 1 mL total and 2 mL lidocaine 1% without epinephrine utilized for needle placement anesthetic Advised to contact for fevers/chills, erythema, induration, drainage, or persistent bleeding.     Independent interpretation of notes and tests performed by another provider:   None  Procedures performed:   Procedure:  Injection of left shoulder subacromial under ultrasound guidance. Ultrasound guidance utilized for in-plane approach to left subacromial space, visualized supraspinatus without overt sonographic abnormality Samsung HS60 device utilized with permanent recording / reporting. Verbal informed consent obtained and verified. Skin prepped in a sterile fashion. Ethyl chloride for topical local analgesia.  Completed without difficulty and tolerated  well. Medication: triamcinolone acetonide 40 mg/mL suspension for injection 1 mL total and 2 mL lidocaine 1% without epinephrine utilized for needle placement anesthetic Advised to contact for fevers/chills, erythema, induration, drainage, or persistent bleeding.   Pertinent History, Exam, Impression, and Recommendations:   Problem List Items Addressed This Visit       Musculoskeletal and Integument   Rotator cuff syndrome, left - Primary   History of Present Illness Catherine Gilbert, a patient with a history of prior left rotator cuff syndrome, presents for a follow-up visit. The patient reports that the pain, which had been previously managed with a cortisone shot on 01/09/2023, returned about a month ago, approximately three months after the initial treatment. The pain is now constant and occurs even when the patient is at rest. The patient describes the pain as an ache located in the same spot as before (lateral left shoulder without radiation). The patient has been managing the pain with over-the-counter Tylenol.  Physical Exam MUSCULOSKELETAL: Forward flexion of shoulder limited to 90 degrees due to pain, localized anteriorly. Full external rotation, symmetric. Mild pain with external rotation, strength graded as 5/5. Full painless strength with internal rotation. Strength graded as 5-/5 with isolated supraspinatus test, maximally painful. Non-tender subacromial. Non-tender bicipital groove. Positive Neer's test. Positive Hawkins test. Negative Speed's test, equivocal Yergason's Test.  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 Discussed the benefits of strengthening exercises for long-term management.  We reviewed both surgical and nonsurgical management options.  She wishes to proceed in a nonsurgical manner with repeat corticosteroid injection. -Administered corticosteroid  injection today under ultrasound guidance -Prescribed Diclofenac 75 mg twice daily as needed for pain management. -Provided  home exercises for rotator cuff strengthening.  ([facility] shoulder conditioning program) -Plan for potential MRI if symptoms recur after this treatment course. -Encouraged patient to message if symptoms recur for further management planning.      Relevant Medications   diclofenac (VOLTAREN) 75 MG EC tablet   Other Relevant Orders   [place] LIMITED JOINT SPACE STRUCTURES UP LEFT (Completed)     Orders & Medications Medications:  Meds ordered this encounter  Medications   diclofenac (VOLTAREN) 75 MG EC tablet    Sig: Take 1 tablet (75 mg total) by mouth 2 (two) times daily as needed.    Dispense:  60 tablet    Refill:  0   Orders Placed This Encounter  Procedures   [place] LIMITED JOINT SPACE STRUCTURES UP LEFT     Return if symptoms worsen or fail to improve.     Jerrol Banana, MD, [facility]   Primary Care Sports Medicine Primary Care and Sports Medicine at [facility]

## 2023-06-15 NOTE — Assessment & Plan Note (Signed)
History of Present Illness Catherine Gilbert, a patient with a history of prior left rotator cuff syndrome, presents for a follow-up visit. The patient reports that the pain, which had been previously managed with a cortisone shot on 01/09/2023, returned about a month ago, approximately three months after the initial treatment. The pain is now constant and occurs even when the patient is at rest. The patient describes the pain as an ache located in the same spot as before (lateral left shoulder without radiation). The patient has been managing the pain with over-the-counter Tylenol.  Physical Exam MUSCULOSKELETAL: Forward flexion of shoulder limited to 90 degrees due to pain, localized anteriorly. Full external rotation, symmetric. Mild pain with external rotation, strength graded as 5/5. Full painless strength with internal rotation. Strength graded as 5-/5 with isolated supraspinatus test, maximally painful. Non-tender subacromial. Non-tender bicipital groove. Positive Neer's test. Positive Hawkins test. Negative Speed's test, equivocal Yergason's Test.  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 Discussed the benefits of strengthening exercises for long-term management.  We reviewed both surgical and nonsurgical management options.  She wishes to proceed in a nonsurgical manner with repeat corticosteroid injection. -Administered corticosteroid injection today under ultrasound guidance -Prescribed Diclofenac 75 mg twice daily as needed for pain management. -Provided home exercises for rotator cuff strengthening.  ([facility] shoulder conditioning program) -Plan for potential MRI if symptoms recur after this treatment course. -Encouraged patient to message if symptoms recur for further management planning.

## 2023-06-16 ENCOUNTER — Other Ambulatory Visit: Payer: Self-pay | Admitting: Family Medicine

## 2023-06-16 DIAGNOSIS — F411 Generalized anxiety disorder: Secondary | ICD-10-CM

## 2023-07-07 ENCOUNTER — Encounter: Payer: Self-pay | Admitting: Family Medicine

## 2023-07-07 DIAGNOSIS — F409 Phobic anxiety disorder, unspecified: Secondary | ICD-10-CM

## 2023-07-07 MED ORDER — ALPRAZOLAM 0.5 MG PO TABS
0.5000 mg | ORAL_TABLET | Freq: Every evening | ORAL | 1 refills | Status: DC | PRN
Start: 2023-07-07 — End: 2023-12-24

## 2023-07-07 NOTE — Telephone Encounter (Signed)
 Last office visit 11/13/2022 for CPE.  Last refilled 10/10/2021 for #30 with 1 refill.  Next Appt: No future appointments with PCP.

## 2023-09-08 DIAGNOSIS — G4733 Obstructive sleep apnea (adult) (pediatric): Secondary | ICD-10-CM | POA: Diagnosis not present

## 2023-09-15 ENCOUNTER — Encounter: Payer: Self-pay | Admitting: Internal Medicine

## 2023-09-16 ENCOUNTER — Encounter: Payer: Self-pay | Admitting: Family Medicine

## 2023-09-16 ENCOUNTER — Ambulatory Visit: Admitting: Family Medicine

## 2023-09-16 VITALS — BP 138/74 | HR 82 | Temp 98.0°F | Ht 64.0 in | Wt 179.0 lb

## 2023-09-16 DIAGNOSIS — S46819A Strain of other muscles, fascia and tendons at shoulder and upper arm level, unspecified arm, initial encounter: Secondary | ICD-10-CM | POA: Insufficient documentation

## 2023-09-16 MED ORDER — CYCLOBENZAPRINE HCL 10 MG PO TABS: 5.0000 mg | ORAL_TABLET | Freq: Three times a day (TID) | ORAL | 0 refills | Status: DC | PRN

## 2023-09-16 NOTE — Assessment & Plan Note (Addendum)
 Acute, no red flags for infectious source.  Most likely strain of upper back/neck musculature.    Start diclofenac 75 mg twice daily  ( has some leftover at home)  Can use muscle relaxant at night prn.  Apply heat, start gentle  ROM exercise and stretch upp back.   Can try massage.  Return and ER precautions provided.

## 2023-09-16 NOTE — Patient Instructions (Addendum)
 Start diclofenac 75 mg twice daily   Can use muscle relaxant at night prn.  Apply heat, start gentle  ROM exercise and stretch upp back.   Can try massage.

## 2023-09-16 NOTE — Progress Notes (Signed)
 Patient ID: Catherine Gilbert, female    DOB: 1975-08-26, 48 y.o.   MRN: [id number]  This visit was conducted in person.  BP 138/74   Pulse 82   Temp 98 F (36.7 C) (Oral)   Ht 5\' 4"  (1.626 m)   Wt 179 lb (81.2 kg)   SpO2 94%   BMI 30.73 kg/m    CC:  Chief Complaint  Patient presents with   Back Pain   Shoulder Pain   Neck Pain    Pain began at the base of the skull on Friday and has since shifted downward. Across the top part of the back. Cannot recall what may have caused the onset of pain    Subjective:   HPI: Catherine Gilbert is a 48 y.o. female presenting on 09/16/2023 for Back Pain, Shoulder Pain, and Neck Pain (Pain began at the base of the skull on Friday and has since shifted downward. Across the top part of the back. Cannot recall what may have caused the onset of pain)   New onset pain in  neck in last 6 days.  Started at base of skull, moved downward. Woke up with pain. Pain lowered down to right neck and shoulder, upper back  No radiation of symptoms into hand.  Able to move head, but feels stiff.  No numbness, no weak.  No fever.  Has tried to stretch neck. Using tylenol to treat.   No past neck issue. No past shoulder or neck surgeries.  No proceeding fall or change in activity. Works as Print production planner.    Nausea     Relevant past medical, surgical, family and social history reviewed and updated as indicated. Interim medical history since our last visit reviewed. Allergies and medications reviewed and updated. Outpatient Medications Prior to Visit  Medication Sig Dispense Refill   ALPRAZolam (XANAX) 0.5 MG tablet Take 1 tablet (0.5 mg total) by mouth at bedtime as needed. 30 tablet 1   diclofenac (VOLTAREN) 75 MG EC tablet Take 1 tablet (75 mg total) by mouth 2 (two) times daily as needed. 60 tablet 0   lisinopril (ZESTRIL) 10 MG tablet TAKE 1/2 TO 1 TABLET BY MOUTH EVERY DAY 90 tablet 3   Misc Natural Products (MAGIC MUSHROOM MIX) CAPS Take by mouth.      omeprazole (PRILOSEC) 20 MG capsule Take 1 capsule (20 mg total) by mouth daily. 30 capsule 5   sertraline (ZOLOFT) 100 MG tablet TAKE 1 AND 1/2 TABLETS(150 MG) BY MOUTH DAILY 135 tablet 1   Specialty Vitamins Products (COMPLETE MENOPAUSE AM/PM) MISC Take by mouth 2 (two) times daily.     Cholecalciferol (VITAMIN D3) 1.25 MG (50000 UT) CAPS Take 1 capsule (1.25 mg total) by mouth once a week. 12 capsule 0   No facility-administered medications prior to visit.     Per HPI unless specifically indicated in ROS section below Review of Systems  Constitutional:  Negative for fatigue and fever.  HENT:  Negative for congestion.   Eyes:  Negative for pain.  Respiratory:  Negative for cough and shortness of breath.   Cardiovascular:  Negative for chest pain, palpitations and leg swelling.  Gastrointestinal:  Negative for abdominal pain.  Genitourinary:  Negative for dysuria and vaginal bleeding.  Musculoskeletal:  Positive for back pain and neck pain.  Neurological:  Negative for syncope, light-headedness and headaches.  Psychiatric/Behavioral:  Negative for dysphoric mood.    Objective:  BP 138/74   Pulse 82   Temp  98 F (36.7 C) (Oral)   Ht 5\' 4"  (1.626 m)   Wt 179 lb (81.2 kg)   SpO2 94%   BMI 30.73 kg/m   Wt Readings from Last 3 Encounters:  09/16/23 179 lb (81.2 kg)  06/15/23 182 lb (82.6 kg)  01/09/23 182 lb (82.6 kg)      Physical Exam Constitutional:      General: She is not in acute distress.    Appearance: Normal appearance. She is well-developed. She is not ill-appearing or toxic-appearing.  HENT:     Head: Normocephalic.     Right Ear: Hearing, tympanic membrane, ear canal and external ear normal. Tympanic membrane is not erythematous, retracted or bulging.     Left Ear: Hearing, tympanic membrane, ear canal and external ear normal. Tympanic membrane is not erythematous, retracted or bulging.     Nose: No mucosal edema or rhinorrhea.     Right Sinus: No maxillary  sinus tenderness or frontal sinus tenderness.     Left Sinus: No maxillary sinus tenderness or frontal sinus tenderness.     Mouth/Throat:     Pharynx: Uvula midline.  Eyes:     General: Lids are normal. Lids are everted, no foreign bodies appreciated.     Conjunctiva/sclera: Conjunctivae normal.     Pupils: Pupils are equal, round, and reactive to light.  Neck:     Thyroid: No thyroid mass or thyromegaly.     Vascular: No carotid bruit.     Trachea: Trachea normal.  Cardiovascular:     Rate and Rhythm: Normal rate and regular rhythm.     Pulses: Normal pulses.     Heart sounds: Normal heart sounds, S1 normal and S2 normal. No murmur heard.    No friction rub. No gallop.  Pulmonary:     Effort: Pulmonary effort is normal. No tachypnea or respiratory distress.     Breath sounds: Normal breath sounds. No decreased breath sounds, wheezing, rhonchi or rales.  Abdominal:     General: Bowel sounds are normal.     Palpations: Abdomen is soft.     Tenderness: There is no abdominal tenderness.  Musculoskeletal:     Right shoulder: Normal. No tenderness or bony tenderness. Normal range of motion.     Right upper arm: Normal.     Cervical back: Neck supple. Spasms, tenderness and bony tenderness present. Pain with movement present. Decreased range of motion.     Thoracic back: Normal.     Lumbar back: Normal.  Skin:    General: Skin is warm and dry.     Findings: No rash.  Neurological:     Mental Status: She is alert.     Cranial Nerves: Cranial nerves 2-12 are intact.     Sensory: Sensation is intact.     Motor: Motor function is intact.     Coordination: Coordination is intact.     Gait: Gait is intact.  Psychiatric:        Mood and Affect: Mood is not anxious or depressed.        Speech: Speech normal.        Behavior: Behavior normal. Behavior is cooperative.        Thought Content: Thought content normal.        Judgment: Judgment normal.       Results for orders placed or  performed in visit on 05/12/23  CMP ([facility] only)   Collection Time: 05/12/23  3:16 PM  Result Value Ref Range  Sodium 137 135 - 145 mmol/L   Potassium 4.0 3.5 - 5.1 mmol/L   Chloride 103 98 - 111 mmol/L   CO2 26 22 - 32 mmol/L   Glucose, Bld 121 (H) 70 - 99 mg/dL   BUN 13 6 - 20 mg/dL   Creatinine 4.25 9.56 - 1.00 mg/dL   Calcium 9.1 8.9 - 38.7 mg/dL   Total Protein 7.1 6.5 - 8.1 g/dL   Albumin 4.3 3.5 - 5.0 g/dL   AST 23 15 - 41 U/L   ALT 22 0 - 44 U/L   Alkaline Phosphatase 38 38 - 126 U/L   Total Bilirubin 0.6 <1.2 mg/dL   GFR, Estimated >56 >43 mL/min   Anion gap 8 5 - 15  CBC with Differential (Can[facility] Center Only)   Collection Time: 05/12/23  3:16 PM  Result Value Ref Range   WBC Count 9.0 4.0 - 10.5 K/uL   RBC 3.99 3.87 - 5.11 MIL/uL   Hemoglobin 12.0 12.0 - 15.0 g/dL   HCT 32.9 51.8 - 84.1 %   MCV 90.2 80.0 - 100.0 fL   MCH 30.1 26.0 - 34.0 pg   MCHC 33.3 30.0 - 36.0 g/dL   RDW 66.0 63.0 - 16.0 %   Platelet Count 235 150 - 400 K/uL   nRBC 0.0 0.0 - 0.2 %   Neutrophils Relative % 62 %   Neutro Abs 5.6 1.7 - 7.7 K/uL   Lymphocytes Relative 28 %   Lymphs Abs 2.5 0.7 - 4.0 K/uL   Monocytes Relative 8 %   Monocytes Absolute 0.7 0.1 - 1.0 K/uL   Eosinophils Relative 1 %   Eosinophils Absolute 0.1 0.0 - 0.5 K/uL   Basophils Relative 0 %   Basophils Absolute 0.0 0.0 - 0.1 K/uL   Immature Granulocytes 1 %   Abs Immature Granulocytes 0.05 0.00 - 0.07 K/uL    Assessment and Plan  Strain of trapezius muscle, unspecified laterality, initial encounter Assessment & Plan: Acute, no red flags for infectious source.  Most likely strain of upper back/neck musculature.    Start diclofenac 75 mg twice daily  ( has some leftover at home)  Can use muscle relaxant at night prn.  Apply heat, start gentle  ROM exercise and stretch upp back.   Can try massage.  Return and ER precautions provided.   Other orders -     Cyclobenzaprine HCl; Take 0.5-1 tablets (5-10 mg  total) by mouth 3 (three) times daily as needed for muscle spasms.  Dispense: 30 tablet; Refill: 0    Return if symptoms worsen or fail to improve.   Kerby Nora, MD

## 2023-09-17 ENCOUNTER — Encounter: Payer: Self-pay | Admitting: Family Medicine

## 2023-09-17 NOTE — Telephone Encounter (Signed)
 Go ahead and provide a doctor's note for an extra day leave.

## 2023-09-24 ENCOUNTER — Encounter: Payer: Self-pay | Admitting: Family Medicine

## 2023-09-24 ENCOUNTER — Other Ambulatory Visit: Payer: Self-pay | Admitting: *Deleted

## 2023-09-24 MED ORDER — VALACYCLOVIR HCL 500 MG PO TABS: ORAL_TABLET | ORAL | 0 refills | Status: AC

## 2023-09-24 NOTE — Telephone Encounter (Signed)
 Last office visit 09/16/2023 for strain of trapezius.  Last refilled?  Not on current medication list but is in her medication history.  Next Appt: No future appointments for PCP.

## 2023-10-26 ENCOUNTER — Encounter: Payer: Self-pay | Admitting: Family Medicine

## 2023-10-26 ENCOUNTER — Ambulatory Visit: Admitting: Family Medicine

## 2023-10-26 ENCOUNTER — Other Ambulatory Visit (INDEPENDENT_AMBULATORY_CARE_PROVIDER_SITE_OTHER): Payer: Self-pay | Admitting: Radiology

## 2023-10-26 VITALS — BP 140/88 | HR 101 | Ht 64.0 in | Wt 174.0 lb

## 2023-10-26 DIAGNOSIS — M65312 Trigger thumb, left thumb: Secondary | ICD-10-CM | POA: Diagnosis not present

## 2023-10-26 DIAGNOSIS — M75102 Unspecified rotator cuff tear or rupture of left shoulder, not specified as traumatic: Secondary | ICD-10-CM | POA: Diagnosis not present

## 2023-10-26 MED ORDER — TRIAMCINOLONE ACETONIDE 40 MG/ML IJ SUSP
60.0000 mg | Freq: Once | INTRAMUSCULAR | Status: AC
Start: 2023-10-26 — End: 2023-10-26
  Administered 2023-10-26: 60 mg via INTRAMUSCULAR

## 2023-10-26 MED ORDER — DICLOFENAC SODIUM 75 MG PO TBEC
75.0000 mg | DELAYED_RELEASE_TABLET | Freq: Two times a day (BID) | ORAL | 1 refills | Status: DC | PRN
Start: 2023-10-26 — End: 2024-03-22

## 2023-10-26 NOTE — Assessment & Plan Note (Signed)
 She reports symptoms of trigger finger in her thumb, which has been present for about two weeks. The thumb 'freezes up' and experiences a 'popping' sensation at the knuckle. She recalls receiving a cortisone injection for this issue in the past, which was effective.  Trigger finger Trigger finger in left thumb with locking and popping. Cortisone injection discussed to reduce inflammation and restore function. Exercises to prevent recurrence explained. - Administer cortisone injection to the left thumb under ultrasound guidance. - Provide exercises to stretch and loosen tendons in the thumb and forearm. - Advise relative rest for two days post-injection, avoiding strenuous activities.

## 2023-10-26 NOTE — Patient Instructions (Signed)
 You have just been given a cortisone injection to reduce pain and inflammation. After the injection you may notice immediate relief of pain as a result of the Lidocaine . It is important to rest the area of the injection for 24 to 48 hours after the injection. There is a possibility of some temporary increased discomfort and swelling for up to 72 hours until the cortisone begins to work. If you do have pain, simply rest the joint and use ice. If you can tolerate over the counter medications, you can try Tylenol , Aleve, or Advil  for added relief per package instructions.  Patient Plan  Rotator Cuff Tendinitis (Left Shoulder):  1. Receive a cortisone injection in the left shoulder under ultrasound guidance for immediate pain relief. 2. Begin shoulder strengthening exercises as provided:    - Start with performing them once a week.    - Transition to maintenance exercises a couple of times a month. 3. Take diclofenac  as prescribed for inflammation and pain management. 4. Rest your shoulder for two days after the injection, avoiding strenuous activities. 5. Contact your healthcare provider if symptoms recur or for follow-up.  Trigger Finger (Left Thumb):  1. Receive a cortisone injection in the left thumb under ultrasound guidance. 2. Start exercises to stretch and loosen tendons in the thumb and forearm, as instructed. 3. Rest your thumb for two days after the injection, avoiding strenuous activities.  Red Flags: - Contact your healthcare provider if you experience increased pain, swelling, or any new symptoms in the shoulder or thumb after the injections.

## 2023-10-26 NOTE — Progress Notes (Signed)
 Primary Care / Sports Medicine Office Visit  Patient Information:  Patient ID: ALA LATHON, female DOB: 12/04/1975 Age: 48 y.o. MRN: [id number]   DIAN GROSMAN is a pleasant 48 y.o. female presenting with the following:  Chief Complaint  Patient presents with   Shoulder Pain    Left shoulder pain x 2 weeks. Patient had injection about 3 months ago and it helped significantly and she is requesting injection today.   thumb pain    Left thumb pain x 2 weeks. Patient would like injection in her thumb as well.    Vitals:   10/26/23 1618  BP: (!) 140/88  Pulse: (!) 101  SpO2: 95%   Vitals:   10/26/23 1618  Weight: 174 lb (78.9 kg)  Height: 5\' 4"  (1.626 m)   Body mass index is 29.87 kg/m.  No results found.   Independent interpretation of notes and tests performed by another provider:   None  Procedures performed:   Procedure:  Injection of left shoulder under ultrasound guidance. Ultrasound guidance utilized for in-plane approach to left subacromial space, insertional sonographic change in appearance of tendon most likely consistent with tendinopathy Samsung HS60 device utilized with permanent recording / reporting. Verbal informed consent obtained and verified. Skin prepped in a sterile fashion. Ethyl chloride for topical local analgesia.  Completed without difficulty and tolerated well. Medication: triamcinolone  acetonide 40 mg/mL suspension for injection 1 mL total and 2 mL lidocaine  1% without epinephrine utilized for needle placement anesthetic Advised to contact for fevers/chills, erythema, induration, drainage, or persistent bleeding.  Procedure:  Injection of left thumb under ultrasound guidance. Ultrasound guidance utilized for out of plane first digit trigger finger injection, thickened tendon appreciated Samsung HS60 device utilized with permanent recording / reporting. Verbal informed consent obtained and verified. Skin prepped in a sterile  fashion. Ethyl chloride for topical local analgesia.  Completed without difficulty and tolerated well. Medication: triamcinolone  acetonide 40 mg/mL suspension for injection 0.5 mL total and 0.25 mL lidocaine  1% without epinephrine utilized for needle placement anesthetic Advised to contact for fevers/chills, erythema, induration, drainage, or persistent bleeding.   Pertinent History, Exam, Impression, and Recommendations:   Problem List Items Addressed This Visit     Rotator cuff syndrome, left - Primary   History of Present Illness VERNETTE RYBERG is a 48 year old female who presents for a cortisone injection for recurrent shoulder pain.  She has been experiencing recurrent shoulder pain that initially resolved after a cortisone injection administered on December 16th. The pain returned approximately two weeks ago and is persistent, affecting her daily activities.  Physical Exam PALPATION: Minimally tender at left radial styloid. Nontender first CMC. Nontender subacromial and bicipital groove. Spasm of trapezius. STRENGTH: Internal rotation 5/5 strength, painless. External rotation 5/5 strength, minor lateral shoulder pain. SPECIAL TESTS: Negative Finkelstein's test. Positive empty can test on left with 4/5 strength. Positive Neer's and Hawkins tests.  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 Cortisone injection discussed for immediate relief; strengthening exercises for long-term management. Repeated injections risk structural thinning; strengthening may prevent surgery. - Administer cortisone injection to the left shoulder under ultrasound guidance. - Provide printout of shoulder strengthening exercises. - Advise performing exercises once a week initially, then transitioning to maintenance exercises a couple of times a month. - Prescribe diclofenac  for post-procedure inflammation and pain management. -  Advise relative rest for two days post-injection, avoiding strenuous activities. - Contact us  for  any symptom recurrence and follow-up as needed.      Relevant Medications   diclofenac  (VOLTAREN ) 75 MG EC tablet   Other Relevant Orders   [place]  LIMITED JOINT SPACE STRUCTURES UP LEFT   Trigger thumb, left thumb   She reports symptoms of trigger finger in her thumb, which has been present for about two weeks. The thumb 'freezes up' and experiences a 'popping' sensation at the knuckle. She recalls receiving a cortisone injection for this issue in the past, which was effective.  Trigger finger Trigger finger in left thumb with locking and popping. Cortisone injection discussed to reduce inflammation and restore function. Exercises to prevent recurrence explained. - Administer cortisone injection to the left thumb under ultrasound guidance. - Provide exercises to stretch and loosen tendons in the thumb and forearm. - Advise relative rest for two days post-injection, avoiding strenuous activities.      Relevant Orders   [place]  LIMITED JOINT SPACE STRUCTURES UP LEFT     Orders & Medications Medications:  Meds ordered this encounter  Medications   diclofenac  (VOLTAREN ) 75 MG EC tablet    Sig: Take 1 tablet (75 mg total) by mouth 2 (two) times daily as needed.    Dispense:  60 tablet    Refill:  1   triamcinolone  acetonide (KENALOG -40) injection 60 mg   Orders Placed This Encounter  Procedures   [place]  LIMITED JOINT SPACE STRUCTURES UP LEFT     No follow-ups on file.     Ma Saupe, MD, [facility]   Primary Care Sports Medicine Primary Care and Sports Medicine at [facility]

## 2023-10-26 NOTE — Assessment & Plan Note (Signed)
 History of Present Illness Catherine Gilbert is a 48 year old female who presents for a cortisone injection for recurrent shoulder pain.  She has been experiencing recurrent shoulder pain that initially resolved after a cortisone injection administered on December 16th. The pain returned approximately two weeks ago and is persistent, affecting her daily activities.  Physical Exam PALPATION: Minimally tender at left radial styloid. Nontender first CMC. Nontender subacromial and bicipital groove. Spasm of trapezius. STRENGTH: Internal rotation 5/5 strength, painless. External rotation 5/5 strength, minor lateral shoulder pain. SPECIAL TESTS: Negative Finkelstein's test. Positive empty can test on left with 4/5 strength. Positive Neer's and Hawkins tests.  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 Cortisone injection discussed for immediate relief; strengthening exercises for long-term management. Repeated injections risk structural thinning; strengthening may prevent surgery. - Administer cortisone injection to the left shoulder under ultrasound guidance. - Provide printout of shoulder strengthening exercises. - Advise performing exercises once a week initially, then transitioning to maintenance exercises a couple of times a month. - Prescribe diclofenac  for post-procedure inflammation and pain management. - Advise relative rest for two days post-injection, avoiding strenuous activities. - Contact us  for any symptom recurrence and follow-up as needed.

## 2023-12-19 ENCOUNTER — Other Ambulatory Visit: Payer: Self-pay | Admitting: Family Medicine

## 2023-12-19 DIAGNOSIS — F411 Generalized anxiety disorder: Secondary | ICD-10-CM

## 2023-12-21 NOTE — Telephone Encounter (Signed)
 Lvm to schedule for CPE prior to any medication being sent in

## 2023-12-21 NOTE — Telephone Encounter (Signed)
Please schedule CPE with fasting labs prior with Dr. Ermalene Searing.  Please send back to me once scheduled to refill medication.

## 2023-12-24 ENCOUNTER — Ambulatory Visit (INDEPENDENT_AMBULATORY_CARE_PROVIDER_SITE_OTHER): Admitting: Family Medicine

## 2023-12-24 VITALS — BP 126/78 | HR 77 | Temp 98.0°F | Resp 20 | Ht 64.25 in | Wt 166.2 lb

## 2023-12-24 DIAGNOSIS — F409 Phobic anxiety disorder, unspecified: Secondary | ICD-10-CM

## 2023-12-24 DIAGNOSIS — I10 Essential (primary) hypertension: Secondary | ICD-10-CM

## 2023-12-24 DIAGNOSIS — Z0000 Encounter for general adult medical examination without abnormal findings: Secondary | ICD-10-CM

## 2023-12-24 DIAGNOSIS — F5105 Insomnia due to other mental disorder: Secondary | ICD-10-CM | POA: Diagnosis not present

## 2023-12-24 DIAGNOSIS — E559 Vitamin D deficiency, unspecified: Secondary | ICD-10-CM

## 2023-12-24 DIAGNOSIS — Z85118 Personal history of other malignant neoplasm of bronchus and lung: Secondary | ICD-10-CM

## 2023-12-24 DIAGNOSIS — F411 Generalized anxiety disorder: Secondary | ICD-10-CM | POA: Diagnosis not present

## 2023-12-24 DIAGNOSIS — D509 Iron deficiency anemia, unspecified: Secondary | ICD-10-CM

## 2023-12-24 MED ORDER — SERTRALINE HCL 100 MG PO TABS
ORAL_TABLET | ORAL | 3 refills | Status: DC
Start: 2023-12-24 — End: 2024-04-20

## 2023-12-24 MED ORDER — LISINOPRIL 10 MG PO TABS: ORAL_TABLET | ORAL | 3 refills | Status: AC

## 2023-12-24 MED ORDER — GABAPENTIN 100 MG PO CAPS: 100.0000 mg | ORAL_CAPSULE | Freq: Two times a day (BID) | ORAL | 0 refills | Status: AC | PRN

## 2023-12-24 MED ORDER — ALPRAZOLAM 0.5 MG PO TABS
0.5000 mg | ORAL_TABLET | Freq: Every evening | ORAL | 1 refills | Status: AC | PRN
Start: 2023-12-24 — End: ?

## 2023-12-24 NOTE — Assessment & Plan Note (Addendum)
 Chronic.  Continue current medication.  She has a lot of life changes.. separating from husband.  Not interested in counseling.    Increase Sertraline  200 mg daily  Now using alprazolam   0.5  to 1 mg prn about  3-4 times a week. She feels that 1 mg alprazolam  makes her feel too sleepy.  We will try gabapentin  prn anxiety to see if it is less sedating prn med for her.

## 2023-12-24 NOTE — Assessment & Plan Note (Signed)
Stable, chronic.  Continue current medication.   Lisinopril 10 mg daily   

## 2023-12-24 NOTE — Progress Notes (Signed)
 Patient ID: [name], female    DOB: 07/18/1975, 48 y.o.   MRN: [id number]  This visit was conducted in person.  BP 126/78   Pulse 77   Temp 98 F (36.7 C)   Resp 20   Ht 5' 4.25 (1.632 m)   Wt 166 lb 4 oz (75.4 kg)   LMP 11/11/2023   SpO2 98%   BMI 28.32 kg/m    CC:  Chief Complaint  Patient presents with   Annual Exam    Subjective:   HPI: Catherine Gilbert is a 49 y.o. female presenting on 12/24/2023 for Annual Exam  The patient presents for complete physical and review of chronic health problems. He/She also has the following acute concerns today:   Hypertension:  Well-controlled on lisinopril  10 tablet p.o. daily BP Readings from Last 3 Encounters:  12/24/23 126/78  10/26/23 (!) 140/88  09/16/23 138/74  Using medication without problems or lightheadedness:  none Chest pain with exertion: none Edema:none Short of breath:  stable Average home BPs: 130/70 Other issues:  PVC, bradycardia at night: Saw Dr. Mady in past for this.. minimally symptomatic.  Primary adenocarcinoma lung , right: Stage I  Dx 2020 Status post wedge resection with clear margins.  Dr. Rennie oncology following with CT scan every 12 months for total of 5 years.  Last in 04/2022    Hiatal hernia, chronic GERD: on omeprazole  20 mg daily    GAD:  moderate control on sertraline  150 mg daily, still having to use alprazolam   as needed  every 2 weeks   Improved with improved  marital relationship.    12/24/2023   12:31 PM 10/26/2023    4:27 PM 10/26/2023    4:26 PM  Depression screen PHQ 2/9  Decreased Interest 1 0 0  Down, Depressed, Hopeless 2 1 1   PHQ - 2 Score 3 1 1   Altered sleeping 2 0   Tired, decreased energy 2 0   Change in appetite 2 0   Feeling bad or failure about yourself  1 0   Trouble concentrating 1 0   Moving slowly or fidgety/restless 0 0   Suicidal thoughts 0 0   PHQ-9 Score 11 1   Difficult doing work/chores Very difficult Not difficult at all       She  has started testosterone  pellet and progesterone ... [facility]  Due for re-eval of cholesterol.. has upcoming labs with ONC   Relevant past medical, surgical, family and social history reviewed and updated as indicated. Interim medical history since our last visit reviewed. Allergies and medications reviewed and updated. Outpatient Medications Prior to Visit  Medication Sig Dispense Refill   cyclobenzaprine  (FLEXERIL ) 10 MG tablet Take 0.5-1 tablets (5-10 mg total) by mouth 3 (three) times daily as needed for muscle spasms. 30 tablet 0   diclofenac  (VOLTAREN ) 75 MG EC tablet Take 1 tablet (75 mg total) by mouth 2 (two) times daily as needed. 60 tablet 1   Misc Natural Products (MAGIC MUSHROOM MIX) CAPS Take by mouth.     omeprazole  (PRILOSEC) 20 MG capsule Take 1 capsule (20 mg total) by mouth daily. 30 capsule 5   Specialty Vitamins Products (COMPLETE MENOPAUSE AM/PM) MISC Take by mouth 2 (two) times daily.     valACYclovir  (VALTREX ) 500 MG tablet TAKE 2 TABLETS BY MOUTH TODAY, THEN 2 TABLETS TWICE DAILY AS NEEDED. (Patient taking differently: Take 500 mg by mouth as needed. TAKE 2 TABLETS BY MOUTH TODAY, THEN 2 TABLETS  TWICE DAILY AS NEEDED.) 60 tablet 0   ALPRAZolam  (XANAX ) 0.5 MG tablet Take 1 tablet (0.5 mg total) by mouth at bedtime as needed. 30 tablet 1   lisinopril  (ZESTRIL ) 10 MG tablet TAKE 1/2 TO 1 TABLET BY MOUTH EVERY DAY 90 tablet 3   sertraline  (ZOLOFT ) 100 MG tablet TAKE 1 AND 1/2 TABLETS(150 MG) BY MOUTH DAILY 45 tablet 0   No facility-administered medications prior to visit.     Per HPI unless specifically indicated in ROS section below Review of Systems  Constitutional:  Negative for fatigue and fever.  HENT:  Negative for congestion.   Eyes:  Negative for pain.  Respiratory:  Negative for cough and shortness of breath.   Cardiovascular:  Negative for chest pain, palpitations and leg swelling.  Gastrointestinal:  Negative for abdominal pain.  Genitourinary:  Negative  for dysuria and vaginal bleeding.  Musculoskeletal:  Negative for back pain.  Neurological:  Negative for syncope, light-headedness and headaches.  Psychiatric/Behavioral:  Negative for dysphoric mood.    Objective:  BP 126/78   Pulse 77   Temp 98 F (36.7 C)   Resp 20   Ht 5' 4.25 (1.632 m)   Wt 166 lb 4 oz (75.4 kg)   LMP 11/11/2023   SpO2 98%   BMI 28.32 kg/m   Wt Readings from Last 3 Encounters:  12/24/23 166 lb 4 oz (75.4 kg)  10/26/23 174 lb (78.9 kg)  09/16/23 179 lb (81.2 kg)      Physical Exam Vitals and nursing note reviewed.  Constitutional:      General: She is not in acute distress.    Appearance: Normal appearance. She is well-developed. She is not ill-appearing or toxic-appearing.  HENT:     Head: Normocephalic.     Right Ear: Hearing, tympanic membrane, ear canal and external ear normal. Tympanic membrane is not erythematous, retracted or bulging.     Left Ear: Hearing, tympanic membrane, ear canal and external ear normal. Tympanic membrane is not erythematous, retracted or bulging.     Nose: Nose normal. No mucosal edema or rhinorrhea.     Right Sinus: No maxillary sinus tenderness or frontal sinus tenderness.     Left Sinus: No maxillary sinus tenderness or frontal sinus tenderness.     Mouth/Throat:     Pharynx: Uvula midline.   Eyes:     General: Lids are normal. Lids are everted, no foreign bodies appreciated.     Conjunctiva/sclera: Conjunctivae normal.     Pupils: Pupils are equal, round, and reactive to light.   Neck:     Thyroid : No thyroid  mass or thyromegaly.     Vascular: No carotid bruit.     Trachea: Trachea normal.   Cardiovascular:     Rate and Rhythm: Normal rate and regular rhythm.     Pulses: Normal pulses.     Heart sounds: Normal heart sounds, S1 normal and S2 normal. No murmur heard.    No friction rub. No gallop.  Pulmonary:     Effort: Pulmonary effort is normal. No tachypnea or respiratory distress.     Breath sounds:  Normal breath sounds. No decreased breath sounds, wheezing, rhonchi or rales.  Abdominal:     General: Bowel sounds are normal. There is no distension or abdominal bruit.     Palpations: Abdomen is soft. There is no fluid wave or mass.     Tenderness: There is no abdominal tenderness. There is no guarding or rebound.  Hernia: No hernia is present.  Genitourinary:    Exam position: Supine.     Labia:        Right: No rash, tenderness or lesion.        Left: Lesion present. No rash or tenderness.      Vagina: Normal.     Cervix: No cervical motion tenderness, discharge or friability.     Uterus: Not enlarged and not tender.      Adnexa:        Right: No mass, tenderness or fullness.         Left: No mass, tenderness or fullness.       Comments:  Small mobile cysts in vulva, no clear erythema , heat or warmth... treat with topical antibiotics oitnment and warm compress  Musculoskeletal:     Right shoulder: No tenderness or bony tenderness. Normal range of motion.     Left shoulder: Tenderness present. No bony tenderness. Decreased range of motion.     Cervical back: Normal range of motion and neck supple.     Comments:  Positive empty can  Lymphadenopathy:     Cervical: No cervical adenopathy.   Skin:    General: Skin is warm and dry.     Findings: No rash.   Neurological:     Mental Status: She is alert.     Cranial Nerves: No cranial nerve deficit.     Sensory: No sensory deficit.   Psychiatric:        Mood and Affect: Mood is not anxious or depressed.        Speech: Speech normal.        Behavior: Behavior normal. Behavior is cooperative.        Thought Content: Thought content normal.        Judgment: Judgment normal.       Results for orders placed or performed in visit on 05/12/23  CMP (Cancer Center only)   Collection Time: 05/12/23  3:16 PM  Result Value Ref Range   Sodium 137 135 - 145 mmol/L   Potassium 4.0 3.5 - 5.1 mmol/L   Chloride 103 98 - 111 mmol/L    CO2 26 22 - 32 mmol/L   Glucose, Bld 121 (H) 70 - 99 mg/dL   BUN 13 6 - 20 mg/dL   Creatinine 9.06 9.55 - 1.00 mg/dL   Calcium 9.1 8.9 - 89.6 mg/dL   Total Protein 7.1 6.5 - 8.1 g/dL   Albumin 4.3 3.5 - 5.0 g/dL   AST 23 15 - 41 U/L   ALT 22 0 - 44 U/L   Alkaline Phosphatase 38 38 - 126 U/L   Total Bilirubin 0.6 <1.2 mg/dL   GFR, Estimated >39 >39 mL/min   Anion gap 8 5 - 15  CBC with Differential (Cancer Center Only)   Collection Time: 05/12/23  3:16 PM  Result Value Ref Range   WBC Count 9.0 4.0 - 10.5 K/uL   RBC 3.99 3.87 - 5.11 MIL/uL   Hemoglobin 12.0 12.0 - 15.0 g/dL   HCT 63.9 63.9 - 53.9 %   MCV 90.2 80.0 - 100.0 fL   MCH 30.1 26.0 - 34.0 pg   MCHC 33.3 30.0 - 36.0 g/dL   RDW 88.0 88.4 - 84.4 %   Platelet Count 235 150 - 400 K/uL   nRBC 0.0 0.0 - 0.2 %   Neutrophils Relative % 62 %   Neutro Abs 5.6 1.7 - 7.7 K/uL   Lymphocytes Relative 28 %  Lymphs Abs 2.5 0.7 - 4.0 K/uL   Monocytes Relative 8 %   Monocytes Absolute 0.7 0.1 - 1.0 K/uL   Eosinophils Relative 1 %   Eosinophils Absolute 0.1 0.0 - 0.5 K/uL   Basophils Relative 0 %   Basophils Absolute 0.0 0.0 - 0.1 K/uL   Immature Granulocytes 1 %   Abs Immature Granulocytes 0.05 0.00 - 0.07 K/uL     COVID 19 screen:  No recent travel or known exposure to COVID19 The patient denies respiratory symptoms of COVID 19 at this time. The importance of social distancing was discussed today.   Assessment and Plan The patient's preventative maintenance and recommended screening tests for an annual wellness exam were reviewed in full today. Brought up to date unless services declined.  Counselled on the importance of diet, exercise, and its role in overall health and mortality. The patient's FH and SH was reviewed, including their home life, tobacco status, and drug and alcohol status.   Vaccines: COVID x 2, Td uptodate Pap/DVE:  10/2017, 10/2022 repeat in 5 years Mammo:01/29/2023 Bone Density:not indicated Colon:  04/25/2021, repeat in 7 years Smoking Status: former ETOH/ drug use: none/none  Hep C:  done  HIV screen:   done   Problem List Items Addressed This Visit     Essential hypertension   Stable, chronic.  Continue current medication.   Lisinopril  10 mg daily        Relevant Medications   lisinopril  (ZESTRIL ) 10 MG tablet   Other Relevant Orders   Lipid panel   Comprehensive metabolic panel with GFR   GAD (generalized anxiety disorder)   Chronic.  Continue current medication.  She has a lot of life changes.. separating from husband.  Not interested in counseling.    Increase Sertraline  200 mg daily  Now using alprazolam   0.5  to 1 mg prn about  3-4 times a week. She feels that 1 mg alprazolam  makes her feel too sleepy.  We will try gabapentin  prn anxiety to see if it is less sedating prn med for her.      Relevant Medications   ALPRAZolam  (XANAX ) 0.5 MG tablet   sertraline  (ZOLOFT ) 100 MG tablet   History of adenocarcinoma of lung   Status post wedge resection with clear margins.  Dr. Rennie oncology following with CT scan every 12 months for total of 5 years      Iron deficiency anemia   Relevant Orders   CBC with Differential/Platelet   IBC + Ferritin   Vitamin D  deficiency   Relevant Orders   VITAMIN D  25 Hydroxy (Vit-D Deficiency, Fractures)   Other Visit Diagnoses       Routine general medical examination at a health care facility    -  Primary     Insomnia due to anxiety and fear       Relevant Medications   ALPRAZolam  (XANAX ) 0.5 MG tablet        Greig Ring, MD

## 2023-12-24 NOTE — Assessment & Plan Note (Signed)
Status post wedge resection with clear margins.  Dr. Brahmanday oncology following with CT scan every 12 months for total of 5 years 

## 2023-12-28 ENCOUNTER — Other Ambulatory Visit (INDEPENDENT_AMBULATORY_CARE_PROVIDER_SITE_OTHER)

## 2023-12-28 DIAGNOSIS — I10 Essential (primary) hypertension: Secondary | ICD-10-CM

## 2023-12-28 DIAGNOSIS — D509 Iron deficiency anemia, unspecified: Secondary | ICD-10-CM

## 2023-12-28 DIAGNOSIS — E559 Vitamin D deficiency, unspecified: Secondary | ICD-10-CM | POA: Diagnosis not present

## 2023-12-28 LAB — COMPREHENSIVE METABOLIC PANEL WITH GFR
ALT: 25 U/L (ref 0–35)
AST: 22 U/L (ref 0–37)
Albumin: 4.5 g/dL (ref 3.5–5.2)
Alkaline Phosphatase: 37 U/L — ABNORMAL LOW (ref 39–117)
BUN: 12 mg/dL (ref 6–23)
CO2: 29 meq/L (ref 19–32)
Calcium: 9.4 mg/dL (ref 8.4–10.5)
Chloride: 103 meq/L (ref 96–112)
Creatinine, Ser: 0.89 mg/dL (ref 0.40–1.20)
GFR: 76.54 mL/min (ref >60.00)
Glucose, Bld: 93 mg/dL (ref 70–99)
Potassium: 3.6 meq/L (ref 3.5–5.1)
Sodium: 140 meq/L (ref 135–145)
Total Bilirubin: 0.5 mg/dL (ref 0.2–1.2)
Total Protein: 6.6 g/dL (ref 6.0–8.3)

## 2023-12-28 LAB — IBC + FERRITIN
Ferritin: 14.8 ng/mL (ref 10.0–291.0)
Iron: 118 ug/dL (ref 42–145)
Saturation Ratios: 35 % (ref 20.0–50.0)
TIBC: 337.4 ug/dL (ref 250.0–450.0)
Transferrin: 241 mg/dL (ref 212.0–360.0)

## 2023-12-28 LAB — CBC WITH DIFFERENTIAL/PLATELET
Basophils Absolute: 0 10*3/uL (ref 0.0–0.1)
Basophils Relative: 0.3 % (ref 0.0–3.0)
Eosinophils Absolute: 0.2 10*3/uL (ref 0.0–0.7)
Eosinophils Relative: 2.4 % (ref 0.0–5.0)
HCT: 37.9 % (ref 36.0–46.0)
Hemoglobin: 12.8 g/dL (ref 12.0–15.0)
Lymphocytes Relative: 35 % (ref 12.0–46.0)
Lymphs Abs: 2.4 10*3/uL (ref 0.7–4.0)
MCHC: 33.8 g/dL (ref 30.0–36.0)
MCV: 91.1 fl (ref 78.0–100.0)
Monocytes Absolute: 0.5 10*3/uL (ref 0.1–1.0)
Monocytes Relative: 6.7 % (ref 3.0–12.0)
Neutro Abs: 3.9 10*3/uL (ref 1.4–7.7)
Neutrophils Relative %: 55.6 % (ref 43.0–77.0)
Platelets: 228 10*3/uL (ref 150.0–400.0)
RBC: 4.16 Mil/uL (ref 3.87–5.11)
RDW: 13.3 % (ref 11.5–15.5)
WBC: 7 10*3/uL (ref 4.0–10.5)

## 2023-12-28 LAB — LIPID PANEL
Cholesterol: 224 mg/dL — ABNORMAL HIGH (ref 0–200)
HDL: 44.4 mg/dL (ref >39.00)
LDL Cholesterol: 132 mg/dL — ABNORMAL HIGH (ref 0–99)
NonHDL: 179.21
Total CHOL/HDL Ratio: 5
Triglycerides: 234 mg/dL — ABNORMAL HIGH (ref 0.0–149.0)
VLDL: 46.8 mg/dL — ABNORMAL HIGH (ref 0.0–40.0)

## 2023-12-28 LAB — VITAMIN D 25 HYDROXY (VIT D DEFICIENCY, FRACTURES): VITD: 35.68 ng/mL (ref 30.00–100.00)

## 2023-12-29 ENCOUNTER — Ambulatory Visit: Payer: Self-pay | Admitting: Family Medicine

## 2023-12-29 NOTE — Progress Notes (Signed)
 No critical labs need to be addressed urgently. We will discuss labs in detail at upcoming office visit.

## 2024-01-28 ENCOUNTER — Telehealth: Admitting: Family Medicine

## 2024-01-28 ENCOUNTER — Encounter: Payer: Self-pay | Admitting: Family Medicine

## 2024-01-28 VITALS — BP 143/103 | HR 89 | Ht 64.25 in | Wt 163.1 lb

## 2024-01-28 DIAGNOSIS — I10 Essential (primary) hypertension: Secondary | ICD-10-CM | POA: Diagnosis not present

## 2024-01-28 DIAGNOSIS — F5105 Insomnia due to other mental disorder: Secondary | ICD-10-CM | POA: Diagnosis not present

## 2024-01-28 DIAGNOSIS — F411 Generalized anxiety disorder: Secondary | ICD-10-CM | POA: Diagnosis not present

## 2024-01-28 DIAGNOSIS — F409 Phobic anxiety disorder, unspecified: Secondary | ICD-10-CM

## 2024-01-28 NOTE — Progress Notes (Signed)
 VIRTUAL VISIT A virtual visit is felt to be most appropriate for this patient at this time.   I connected with the patient on 01/28/24 at  3:00 PM EDT by virtual telehealth platform and verified that I am speaking with the correct person using two identifiers.   I discussed the limitations, risks, security and privacy concerns of performing an evaluation and management service by  virtual telehealth platform and the availability of in person appointments. I also discussed with the patient that there may be a patient responsible charge related to this service. The patient expressed understanding and agreed to proceed.  Patient location: Home Provider Location:  [place] Participants: Greig Ring and Ami VEAR Blush   Chief Complaint  Patient presents with   Anxiety    Follow up Mood    History of Present Illness:  48 y.o. female patient of Kerie Badger E, MD presents for follow up mood  At last OV 12/24/2023  She has a lot of life changes.. separating from husband.  Not interested in counseling.    Increased Sertraline   to 200 mg daily  Now using alprazolam   0.5  to 1 mg prn about  3-4 times a week. She feels that 1 mg alprazolam  makes her feel too sleepy.  We will try gabapentin  prn anxiety to see if it is less sedating prn med for her.   Today she reports she has noted slight improvement in mood with higher dose of sertraline .  She is still very anxious about new job. Has another job option opening up.   Did try gabapentin   but it did not help much. Did not notice anything.  She is using alprazolam  0.5 mg 3 times a week.   She is feeling very anxious today and this may be causing her blood pressure elevation. BP Readings from Last 3 Encounters:  01/28/24 (!) 143/103  12/24/23 126/78  10/26/23 (!) 140/88       01/28/2024    3:02 PM 12/24/2023   12:31 PM 10/26/2023    4:27 PM  Depression screen PHQ 2/9  Decreased Interest 1 1 0  Down, Depressed, Hopeless 1 2 1    PHQ - 2 Score 2 3 1   Altered sleeping 1 2 0  Tired, decreased energy 1 2 0  Change in appetite 1 2 0  Feeling bad or failure about yourself  0 1 0  Trouble concentrating 1 1 0  Moving slowly or fidgety/restless 0 0 0  Suicidal thoughts 0 0 0  PHQ-9 Score 6 11 1   Difficult doing work/chores Somewhat difficult Very difficult Not difficult at all       01/28/2024    3:05 PM 12/24/2023   12:31 PM 10/26/2023    4:27 PM 09/16/2023   12:00 PM  GAD 7 : Generalized Anxiety Score  Nervous, Anxious, on Edge 2 3 1 1   Control/stop worrying 2 3 1 1   Worry too much - different things 2 3 1 1   Trouble relaxing 0 1 1 0  Restless 0 0 0 0  Easily annoyed or irritable 1 1 1 3   Afraid - awful might happen 2 3 1 2   Total GAD 7 Score 9 14 6 8   Anxiety Difficulty Very difficult Somewhat difficult Not difficult at all Somewhat difficult         COVID 19 screen No recent travel or known exposure to COVID19 The patient denies respiratory symptoms of COVID 19 at this time.  The importance of social distancing  was discussed today.   ROS    Past Medical History:  Diagnosis Date   Abnormal Pap smear    Anemia    Anxiety    Anxiety state 04/23/2011   Cancer (HCC) 02/2018   lung rt   Depression    Dysmenorrhea 08/27/2011   Dyspnea on exertion 10/30/2015   Essential hypertension 09/15/2012   Family history of ASCVD (arteriosclerotic cardiovascular disease) 12/31/2016   Family history of premature CAD 11/11/2016   GAD (generalized anxiety disorder) 10/30/2015   GERD (gastroesophageal reflux disease) 05/29/2014   Headache 01/01/2017   HSV (herpes simplex virus) infection    Hypertension    Initiation of Depo Provera  09/24/2011   Lung nodule    RLL on coronary calcium score CT   Migraine headache    Sleep apnea     reports that she quit smoking about 8 years ago. Her smoking use included cigarettes. She started smoking about 33 years ago. She has a 25 pack-year smoking history. She has  never used smokeless tobacco. She reports current alcohol use. She reports that she does not currently use drugs after having used the following drugs: Marijuana.   Current Outpatient Medications:    ALPRAZolam  (XANAX ) 0.5 MG tablet, Take 1 tablet (0.5 mg total) by mouth at bedtime as needed., Disp: 30 tablet, Rfl: 1   cyclobenzaprine  (FLEXERIL ) 10 MG tablet, Take 0.5-1 tablets (5-10 mg total) by mouth 3 (three) times daily as needed for muscle spasms., Disp: 30 tablet, Rfl: 0   diclofenac  (VOLTAREN ) 75 MG EC tablet, Take 1 tablet (75 mg total) by mouth 2 (two) times daily as needed., Disp: 60 tablet, Rfl: 1   gabapentin  (NEURONTIN ) 100 MG capsule, Take 1 capsule (100 mg total) by mouth 2 (two) times daily as needed (anxiety)., Disp: 30 capsule, Rfl: 0   lisinopril  (ZESTRIL ) 10 MG tablet, TAKE 1/2 TO 1 TABLET BY MOUTH EVERY DAY, Disp: 90 tablet, Rfl: 3   Misc Natural Products (MAGIC MUSHROOM MIX) CAPS, Take by mouth., Disp: , Rfl:    omeprazole  (PRILOSEC) 20 MG capsule, Take 1 capsule (20 mg total) by mouth daily., Disp: 30 capsule, Rfl: 5   sertraline  (ZOLOFT ) 100 MG tablet, 2 tablets  po daily, Disp: 60 tablet, Rfl: 3   Specialty Vitamins Products (COMPLETE MENOPAUSE AM/PM) MISC, Take by mouth 2 (two) times daily., Disp: , Rfl:    valACYclovir  (VALTREX ) 500 MG tablet, TAKE 2 TABLETS BY MOUTH TODAY, THEN 2 TABLETS TWICE DAILY AS NEEDED., Disp: 60 tablet, Rfl: 0   Observations/Objective: Blood pressure (!) 155/103, pulse 89, height 5' 4.25 (1.632 m), weight 163 lb 2 oz (74 kg), last menstrual period 11/11/2023.  Physical Exam Constitutional:      General: The patient is not in acute distress. Pulmonary:     Effort: Pulmonary effort is normal. No respiratory distress.  Neurological:     Mental Status: The patient is alert and oriented to person, place, and time.  Psychiatric:        Mood and Affect: Mood normal.        Behavior: Behavior normal.    Assessment and Plan GAD (generalized  anxiety disorder) Assessment & Plan: Chronic, slight improvement with sertraline  200 mg daily.  We discussed possible other medication choices such as Cymbalta but she would prefer to see how things go over the next month. She will continue to work on stress reduction and relaxation techniques. She can increase gabapentin  to 200 mg per dose as needed for anxiety to  see if this can help without being sedating. She can also use alprazolam  0.5 mg daily as needed anxiety/panic attacks.  Follow-up with virtual or in person visit in 1 month.   Insomnia due to anxiety and fear  Essential hypertension Assessment & Plan: Chronic, usually well-controlled on lisinopril  10 mg daily.  Currently may be elevated given anxiety.  We did talk about using alprazolam  0.5 mg p.o. as needed anxiety.  She can try gabapentin  200 mg up to 3 times a day as needed for anxiety.  She will continue to follow her blood pressure and will call me if all numbers are trending up for further adjustment of her medication.       I discussed the assessment and treatment plan with the patient. The patient was provided an opportunity to ask questions and all were answered. The patient agreed with the plan and demonstrated an understanding of the instructions.   The patient was advised to call back or seek an in-person evaluation if the symptoms worsen or if the condition fails to improve as anticipated.     Greig Ring, MD

## 2024-01-28 NOTE — Assessment & Plan Note (Signed)
 Chronic, usually well-controlled on lisinopril  10 mg daily.  Currently may be elevated given anxiety.  We did talk about using alprazolam  0.5 mg p.o. as needed anxiety.  She can try gabapentin  200 mg up to 3 times a day as needed for anxiety.  She will continue to follow her blood pressure and will call me if all numbers are trending up for further adjustment of her medication.

## 2024-01-28 NOTE — Assessment & Plan Note (Signed)
 Chronic, slight improvement with sertraline  200 mg daily.  We discussed possible other medication choices such as Cymbalta but she would prefer to see how things go over the next month. She will continue to work on stress reduction and relaxation techniques. She can increase gabapentin  to 200 mg per dose as needed for anxiety to see if this can help without being sedating. She can also use alprazolam  0.5 mg daily as needed anxiety/panic attacks.  Follow-up with virtual or in person visit in 1 month.

## 2024-02-08 ENCOUNTER — Ambulatory Visit: Admitting: Family Medicine

## 2024-02-08 ENCOUNTER — Encounter: Payer: Self-pay | Admitting: Family Medicine

## 2024-02-08 VITALS — BP 130/80 | HR 77 | Ht 64.25 in | Wt 164.0 lb

## 2024-02-08 DIAGNOSIS — M65312 Trigger thumb, left thumb: Secondary | ICD-10-CM | POA: Diagnosis not present

## 2024-02-08 DIAGNOSIS — M75102 Unspecified rotator cuff tear or rupture of left shoulder, not specified as traumatic: Secondary | ICD-10-CM

## 2024-02-08 DIAGNOSIS — M7702 Medial epicondylitis, left elbow: Secondary | ICD-10-CM | POA: Insufficient documentation

## 2024-02-08 MED ORDER — TIZANIDINE HCL 4 MG PO TABS: 4.0000 mg | ORAL_TABLET | Freq: Three times a day (TID) | ORAL | 0 refills | Status: DC | PRN

## 2024-02-08 MED ORDER — MELOXICAM 15 MG PO TABS: 15.0000 mg | ORAL_TABLET | Freq: Every day | ORAL | 1 refills | Status: DC

## 2024-02-08 NOTE — Assessment & Plan Note (Signed)
 Thumb pain - Soreness in the thumb, worsening over time - No current catching sensation in the thumb - Previously used a thumb brace, which she still has at home  Left thumb trigger finger (stenosing tenosynovitis) Intermittent soreness, previously treated with a brace. Discussed conservative management with brace use. - Advise use of thumb brace for two weeks. - Prescribe meloxicam  once daily for two weeks, then as needed. - Refer to physical therapy for comprehensive management, including home exercises. - Consider corticosteroid injection if symptoms persist beyond two weeks despite medication.

## 2024-02-08 NOTE — Assessment & Plan Note (Addendum)
 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 No prior injections. - Prescribe meloxicam  once daily for two weeks, then as needed. - Prescribe tizanidine  three times a day as needed. - Refer to physical therapy for comprehensive management, including home exercises. - Consider corticosteroid injection if symptoms persist beyond two weeks despite medication.

## 2024-02-08 NOTE — Assessment & Plan Note (Addendum)
 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  Physical Exam LEFT SHOULDER INSPECTION: No visible deformity, swelling, ecchymosis, or muscle atrophy PALPATION: Bicipital groove and subacromial space nontender RANGE OF MOTION: Full and symmetric in all planes without pain except mild lateral discomfort with external rotation STRENGTH: 5/5 with external rotation, 5/5 with internal rotation, 5/5 with abduction NEUROLOGICAL: Sensation intact to light touch in the left upper extremity, no motor deficits observed SPECIAL TESTS: Isolated supraspinatus testing localizes discomfort to the medial elbow but asymptomatic at the left shoulder; Neer's test positive; Hawkins test negative; Speed's test negative but elicits pain toward the antecubital region; Yergason's test negative  Left shoulder rotator cuff tendinitis with impingement Chronic tendinitis with impingement. Discussed cortisone injections and emphasized physical therapy for long-term management. - Prescribe meloxicam  once daily for two weeks, then as needed.  Take with food. - Prescribe tizanidine  three times a day as needed.  Caution, side effect can be drowsiness, no driving/machinery x 8 hours after dosing. - Refer to physical therapy for shoulder rehabilitation. - Consider corticosteroid injection if symptoms persist beyond two weeks despite medication.  Contact us  to coordinate.

## 2024-02-08 NOTE — Patient Instructions (Signed)
 Patient Plan for Post-Visit Guidance  Left Medial Epicondylitis (Golfer's Elbow) - Take meloxicam  once daily for two weeks, then as needed. Take with food. - Take tizanidine  up to three times a day as needed. This may cause drowsiness; do not drive or operate machinery for eight hours after taking. - Attend physical therapy for elbow rehabilitation and follow home exercise instructions. - If symptoms persist beyond two weeks despite medication, contact the office to discuss a possible corticosteroid injection.  Left Shoulder Rotator Cuff Tendinitis with Impingement - Take meloxicam  once daily for two weeks, then as needed. Take with food. - Take tizanidine  up to three times a day as needed, with the same precautions as above. - Attend physical therapy for shoulder rehabilitation. - If symptoms persist beyond two weeks despite medication, contact the office to discuss a possible corticosteroid injection.  Left Thumb Trigger Finger - Use thumb brace for two weeks. - Take meloxicam  once daily for two weeks, then as needed. - Attend physical therapy for thumb rehabilitation and follow home exercise instructions. - If symptoms persist beyond two weeks despite medication, contact the office to discuss a possible corticosteroid injection.  Red Flags - If you develop severe pain, swelling, redness, warmth, loss of movement, numbness, or sudden worsening of symptoms in the shoulder, elbow, or thumb, seek medical attention promptly.

## 2024-02-08 NOTE — Progress Notes (Signed)
 Primary Care / Sports Medicine Office Visit  Patient Information:  Patient ID: Catherine Gilbert, female DOB: February 15, 1976 Age: 48 y.o. MRN: [id number]   Catherine Gilbert is a pleasant 48 y.o. female presenting with the following:  Chief Complaint  Patient presents with   Shoulder Pain    Left shoulder pain. Pain is a dull constant ache keeping patient up at night.    Hand Pain    Left thumb locking up for 1 week.     Vitals:   02/08/24 1509  BP: 130/80  Pulse: 77  SpO2: 98%   Vitals:   02/08/24 1509  Weight: 164 lb (74.4 kg)  Height: 5' 4.25 (1.632 m)   Body mass index is 27.93 kg/m.  No results found.   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 No prior injections. - Prescribe meloxicam  once daily for two weeks, then as needed. - Prescribe tizanidine  three times a day as needed. - Refer  to physical therapy for comprehensive management, including home exercises. - Consider corticosteroid injection if symptoms persist beyond two weeks despite medication.      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  Physical Exam LEFT SHOULDER INSPECTION: No visible deformity, swelling, ecchymosis, or muscle atrophy PALPATION: Bicipital groove and subacromial space nontender RANGE OF MOTION: Full and symmetric in all planes without pain except mild lateral discomfort with external rotation STRENGTH: 5/5 with external rotation, 5/5 with internal rotation, 5/5 with abduction NEUROLOGICAL: Sensation intact to light touch in the left upper extremity, no motor deficits observed SPECIAL TESTS: Isolated supraspinatus testing localizes discomfort to the medial elbow but asymptomatic at the left shoulder; Neer's test positive; Hawkins test negative; Speed's test negative but elicits pain toward the antecubital region; Yergason's test negative  Left shoulder rotator cuff tendinitis with impingement Chronic tendinitis with impingement. Discussed cortisone injections and emphasized physical therapy for long-term management. - Prescribe meloxicam  once daily for two weeks, then as needed.  Take with food. - Prescribe tizanidine  three times a day as needed.  Caution, side effect can be drowsiness, no driving/machinery x 8 hours after dosing. - Refer to physical therapy for shoulder rehabilitation. - Consider corticosteroid injection  if symptoms persist beyond two weeks despite medication.  Contact us  to coordinate.      Relevant Medications    meloxicam  (MOBIC ) 15 MG tablet   tiZANidine  (ZANAFLEX ) 4 MG tablet   Other Relevant Orders   Ambulatory referral to Physical Therapy   Trigger thumb, left thumb   Thumb pain - Soreness in the thumb, worsening over time - No current catching sensation in the thumb - Previously used a thumb brace, which she still has at home  Left thumb trigger finger (stenosing tenosynovitis) Intermittent soreness, previously treated with a brace. Discussed conservative management with brace use. - Advise use of thumb brace for two weeks. - Prescribe meloxicam  once daily for two weeks, then as needed. - Refer to physical therapy for comprehensive management, including home exercises. - Consider corticosteroid injection if symptoms persist beyond two weeks despite medication.      Relevant Orders   Ambulatory referral to Physical Therapy     Orders & Medications Medications:  Meds ordered this encounter  Medications   meloxicam  (MOBIC ) 15 MG tablet    Sig: Take 1 tablet (15 mg total) by mouth daily. X 2 weeks then daily PRN. Take with food    Dispense:  30 tablet    Refill:  1   tiZANidine  (ZANAFLEX ) 4 MG tablet    Sig: Take 1 tablet (4 mg total) by mouth every 8 (eight) hours as needed for muscle spasms.    Dispense:  30 tablet    Refill:  0   Orders Placed This Encounter  Procedures   Ambulatory referral to Physical Therapy     No follow-ups on file.     Selinda JINNY Ku, MD, [facility]   Primary Care Sports Medicine Primary Care and Sports Medicine at [facility]

## 2024-03-16 ENCOUNTER — Encounter: Payer: Self-pay | Admitting: Family Medicine

## 2024-03-21 ENCOUNTER — Ambulatory Visit: Admitting: Family Medicine

## 2024-03-22 ENCOUNTER — Ambulatory Visit: Admitting: Family Medicine

## 2024-03-22 ENCOUNTER — Other Ambulatory Visit: Payer: Self-pay | Admitting: Family Medicine

## 2024-03-22 ENCOUNTER — Other Ambulatory Visit (INDEPENDENT_AMBULATORY_CARE_PROVIDER_SITE_OTHER): Payer: Self-pay | Admitting: Radiology

## 2024-03-22 ENCOUNTER — Encounter: Payer: Self-pay | Admitting: Family Medicine

## 2024-03-22 VITALS — BP 110/80 | HR 83 | Ht 64.25 in | Wt 167.0 lb

## 2024-03-22 DIAGNOSIS — M75102 Unspecified rotator cuff tear or rupture of left shoulder, not specified as traumatic: Secondary | ICD-10-CM

## 2024-03-22 DIAGNOSIS — M7702 Medial epicondylitis, left elbow: Secondary | ICD-10-CM | POA: Diagnosis not present

## 2024-03-22 DIAGNOSIS — M65312 Trigger thumb, left thumb: Secondary | ICD-10-CM | POA: Diagnosis not present

## 2024-03-22 MED ORDER — TRIAMCINOLONE ACETONIDE 40 MG/ML IJ SUSP
60.0000 mg | Freq: Once | INTRAMUSCULAR | Status: AC
Start: 2024-03-22 — End: 2024-03-22
  Administered 2024-03-22: 60 mg via INTRA_ARTICULAR

## 2024-03-22 MED ORDER — CELECOXIB 200 MG PO CAPS: 200.0000 mg | ORAL_CAPSULE | Freq: Two times a day (BID) | ORAL | 0 refills | Status: DC | PRN

## 2024-03-22 NOTE — Assessment & Plan Note (Signed)
 Catherine Gilbert is a 48 year old female who presents with persistent left shoulder, hand, and elbow pain.  Left upper extremity pain - Persistent pain in the left shoulder, hand, and elbow since August - Pain occurs at rest and can radiate into the forearm - Aching quality of pain - Pain can occur spontaneously - Pain disrupts sleep and sometimes wakes her at night   Left trigger thumb Persistent pain with pressure, previous injections effective but painful. - Recommend physical therapy for thumb. Provided physical therapy number  - Advise use of ice on thumb post-injection. - Discuss pre-procedure medication to alleviate pain during future injections. Contact us  if wanting to purse this in the future.

## 2024-03-22 NOTE — Assessment & Plan Note (Signed)
 Medial epicondylitis - History of left medial epicondylitis - Previous treatments with meloxicam  and diclofenac  for two weeks each without significant relief   Left medial epicondylitis Chronic condition with mild tenderness, meloxicam  ineffective. - Monitor symptoms, consider cortisone injection if no improvement by late October. - Prescribe Celebrex  as an alternative anti-inflammatory medication.

## 2024-03-22 NOTE — Assessment & Plan Note (Signed)
 Catherine Gilbert is a 48 year old female who presents with persistent left shoulder, hand, and elbow pain.  Left upper extremity pain - Persistent pain in the left shoulder, hand, and elbow since August - Pain occurs at rest and can radiate into the forearm - Aching quality of pain - Pain can occur spontaneously - Pain disrupts sleep and sometimes wakes her at night  Left rotator cuff syndrome Acute on chronic pain with nocturnal pain. Previous injections effective, current anti-inflammatory insufficient. - Administer shoulder injection. - Recommend physical therapy for rotator cuff. - Advise use of ice on shoulder post-injection. - Prescribe Celebrex  twice daily as needed with food. - Discuss potential use of Cymbalta for chronic pain management.

## 2024-03-22 NOTE — Progress Notes (Unsigned)
 Primary Care / Sports Medicine Office Visit  Patient Information:  Patient ID: BLANDINA RENALDO, female DOB: August 28, 1975 Age: 48 y.o. MRN: [id number]   JOHNESHA ACHEAMPONG is a pleasant 48 y.o. female presenting with the following:  Chief Complaint  Patient presents with   Injections    Patient presents today to get cortisone injection in her left shoulder and left thumb. Patient had significant relief for several months from her last injection back in April.     Vitals:   03/22/24 1547  BP: 110/80  Pulse: 83  SpO2: 98%   Vitals:   03/22/24 1547  Weight: 167 lb (75.8 kg)  Height: 5' 4.25 (1.632 m)   Body mass index is 28.44 kg/m.  No results found.   Discussed the use of AI scribe software for clinical note transcription with the patient, who gave verbal consent to proceed.   Independent interpretation of notes and tests performed by another provider:   None  Procedures performed:   Procedure:  Injection of left shoulder under ultrasound guidance. Ultrasound guidance utilized for in-plane approach to left subacromial space, insertional sonographic change in appearance of tendon most likely consistent with tendinopathy Samsung HS60 device utilized with permanent recording / reporting. Verbal informed consent obtained and verified. Skin prepped in a sterile fashion. Ethyl chloride for topical local analgesia.  Completed without difficulty and tolerated well. Medication: triamcinolone  acetonide 40 mg/mL suspension for injection 1 mL total and 2 mL lidocaine  1% without epinephrine utilized for needle placement anesthetic Advised to contact for fevers/chills, erythema, induration, drainage, or persistent bleeding.   Procedure:  Injection of left thumb under ultrasound guidance. Ultrasound guidance utilized for out of plane first digit trigger finger injection, thickened tendon appreciated Samsung HS60 device utilized with permanent recording / reporting. Verbal informed  consent obtained and verified. Skin prepped in a sterile fashion. Ethyl chloride for topical local analgesia.  Completed without difficulty and tolerated well. Medication: triamcinolone  acetonide 40 mg/mL suspension for injection 0.5 mL total and 0.25 mL lidocaine  1% without epinephrine utilized for needle placement anesthetic Advised to contact for fevers/chills, erythema, induration, drainage, or persistent bleeding.  Pertinent History, Exam, Impression, and Recommendations:   Problem List Items Addressed This Visit     Medial epicondylitis, left   Medial epicondylitis - History of left medial epicondylitis - Previous treatments with meloxicam  and diclofenac  for two weeks each without significant relief  Left medial epicondylitis Chronic condition with mild tenderness, meloxicam  ineffective. - Monitor symptoms, consider cortisone injection if no improvement by late October. - Prescribe Celebrex  as an alternative anti-inflammatory medication.      Rotator cuff syndrome, left - Primary   DELLANIRA DILLOW is a 48 year old female who presents with persistent left shoulder, hand, and elbow pain.  Left upper extremity pain - Persistent pain in the left shoulder, hand, and elbow since August - Pain occurs at rest and can radiate into the forearm - Aching quality of pain - Pain can occur spontaneously - Pain disrupts sleep and sometimes wakes her at night  Left rotator cuff syndrome Acute on chronic pain with nocturnal pain. Previous injections effective, current anti-inflammatory insufficient. - Administer shoulder injection. - Recommend physical therapy for rotator cuff. - Advise use of ice on shoulder post-injection. - Prescribe Celebrex  twice daily as needed with food. - Discuss potential use of Cymbalta for chronic pain management.      Relevant Medications   celecoxib  (CELEBREX ) 200 MG capsule   Other Relevant Orders  US  LIMITED JOINT SPACE STRUCTURES UP LEFT   Trigger  thumb, left thumb   ANDRIENNE HAVENER is a 49 year old female who presents with persistent left shoulder, hand, and elbow pain.  Left upper extremity pain - Persistent pain in the left shoulder, hand, and elbow since August - Pain occurs at rest and can radiate into the forearm - Aching quality of pain - Pain can occur spontaneously - Pain disrupts sleep and sometimes wakes her at night  Left trigger thumb Persistent pain with pressure, previous injections effective but painful. - Recommend physical therapy for thumb. Provided physical therapy number  - Advise use of ice on thumb post-injection. - Discuss pre-procedure medication to alleviate pain during future injections. Contact us  if wanting to purse this in the future.      Relevant Medications   celecoxib  (CELEBREX ) 200 MG capsule   Other Relevant Orders   US  LIMITED JOINT SPACE STRUCTURES UP LEFT     Orders & Medications Medications:  Meds ordered this encounter  Medications   triamcinolone  acetonide (KENALOG -40) injection 60 mg   celecoxib  (CELEBREX ) 200 MG capsule    Sig: Take 1 capsule (200 mg total) by mouth 2 (two) times daily as needed. Take with food.    Dispense:  60 capsule    Refill:  0   Orders Placed This Encounter  Procedures   US  LIMITED JOINT SPACE STRUCTURES UP LEFT     No follow-ups on file.     Selinda JINNY Ku, MD, [facility]   Primary Care Sports Medicine Primary Care and Sports Medicine at [facility]

## 2024-03-24 NOTE — Patient Instructions (Signed)
 VISIT SUMMARY:  You visited us  today due to persistent pain in your left shoulder, hand, and elbow. We discussed your ongoing issues and developed a plan to manage your pain and improve your function.  YOUR PLAN:  LEFT ROTATOR CUFF TEAR: You have a chronic tear in your left rotator cuff that causes pain, especially at night. -We administered a shoulder injection today. -You should start physical therapy for your rotator cuff. -Apply ice to your shoulder after the injection. -Take Celebrex  twice daily with food as needed. -We discussed the potential use of Cymbalta for managing chronic pain.  LEFT TRIGGER THUMB: You have persistent pain in your left thumb, especially with pressure. -Start physical therapy for your thumb. -Apply ice to your thumb after any future injections. -We discussed taking medication before future injections to reduce pain.  LEFT MEDIAL EPICONDYLITIS: You have chronic pain in your left elbow that has not improved with meloxicam . -Monitor your symptoms and consider a cortisone injection if there is no improvement by late October. -Take Celebrex  as an alternative anti-inflammatory medication.  LEFT HIP PAIN: You have pain in your left hip that may be related to alignment issues. -Start physical therapy to improve hip alignment and function.

## 2024-03-24 NOTE — Telephone Encounter (Signed)
 Too soon for refill, [facility] 03/22/24.  Requested Prescriptions  Pending Prescriptions Disp Refills   celecoxib  (CELEBREX ) 200 MG capsule [Pharmacy Med Name: CELECOXIB  200MG  CAPSULES] 180 capsule     Sig: TAKE 1 CAPSULE(200 MG) BY MOUTH TWICE DAILY WITH FOOD AS NEEDED     Analgesics:  COX2 Inhibitors Failed - 03/24/2024 11:08 AM      Failed - Manual Review: Labs are only required if the patient has taken medication for more than 8 weeks.      Passed - HGB in normal range and within 360 days    Hemoglobin  Date Value Ref Range Status  12/28/2023 12.8 12.0 - 15.0 g/dL Final  88/87/7975 87.9 12.0 - 15.0 g/dL Final  88/83/7977 87.4 11.1 - 15.9 g/dL Final         Passed - Cr in normal range and within 360 days    Creatinine  Date Value Ref Range Status  05/12/2023 0.93 0.44 - 1.00 mg/dL Final   Creat  Date Value Ref Range Status  02/14/2019 0.74 0.50 - 1.10 mg/dL Final   Creatinine, Ser  Date Value Ref Range Status  12/28/2023 0.89 0.40 - 1.20 mg/dL Final         Passed - HCT in normal range and within 360 days    HCT  Date Value Ref Range Status  12/28/2023 37.9 36.0 - 46.0 % Final   Hematocrit  Date Value Ref Range Status  05/15/2021 36.5 34.0 - 46.6 % Final         Passed - AST in normal range and within 360 days    AST  Date Value Ref Range Status  12/28/2023 22 0 - 37 U/L Final  05/12/2023 23 15 - 41 U/L Final         Passed - ALT in normal range and within 360 days    ALT  Date Value Ref Range Status  12/28/2023 25 0 - 35 U/L Final  05/12/2023 22 0 - 44 U/L Final         Passed - eGFR is 30 or above and within 360 days    GFR calc Af Amer  Date Value Ref Range Status  03/30/2018 >60 >60 mL/min Final    Comment:    (NOTE) The eGFR has been calculated using the CKD EPI equation. This calculation has not been validated in all clinical situations. eGFR's persistently <60 mL/min signify possible Chronic Kidney Disease.    GFR, Estimated  Date Value Ref  Range Status  05/12/2023 >60 >60 mL/min Final    Comment:    (NOTE) Calculated using the CKD-EPI Creatinine Equation (2021)    GFR  Date Value Ref Range Status  12/28/2023 76.54 >60.00 mL/min Final    Comment:    Calculated using the CKD-EPI Creatinine Equation (2021)         Passed - Patient is not pregnant      Passed - Valid encounter within last 12 months    Recent Outpatient Visits           2 days ago Rotator cuff syndrome, left   [place] [facility], Selinda PARAS, MD   1 month ago Rotator cuff syndrome, left   [facility], Selinda PARAS, MD   5 months ago Rotator cuff syndrome, left   [facility] Primary Care & Sports Medicine at [facility], Selinda PARAS, MD

## 2024-04-19 ENCOUNTER — Other Ambulatory Visit: Payer: Self-pay | Admitting: Family Medicine

## 2024-04-19 DIAGNOSIS — F411 Generalized anxiety disorder: Secondary | ICD-10-CM

## 2024-04-20 NOTE — Telephone Encounter (Signed)
 Last office visit 01/28/2024 for GAD.  Last refilled 12/23/2023 for #60 with 3 refills.  AVS states:   Return in about 4 weeks (around 02/25/2024) for follow up mood.  No future appointments with PCP.  Okay to refill?

## 2024-05-11 ENCOUNTER — Telehealth: Payer: Self-pay | Admitting: Nurse Practitioner

## 2024-05-11 NOTE — Telephone Encounter (Signed)
 Pt left vm to cancel appt for 05/12/24.  I called pt back and confirmed that she wanted to cancel and not r/s. Pt stated she wants to cancel. She stated that she is past the 5 year follow up.  Appts are canceled and noted

## 2024-05-12 ENCOUNTER — Inpatient Hospital Stay: Payer: BC Managed Care – PPO | Admitting: Nurse Practitioner

## 2024-05-16 ENCOUNTER — Other Ambulatory Visit: Payer: Self-pay | Admitting: Family Medicine

## 2024-05-16 ENCOUNTER — Encounter: Payer: Self-pay | Admitting: Family Medicine

## 2024-05-16 DIAGNOSIS — F411 Generalized anxiety disorder: Secondary | ICD-10-CM

## 2024-05-17 MED ORDER — SERTRALINE HCL 100 MG PO TABS: 200.0000 mg | ORAL_TABLET | Freq: Every day | ORAL | 1 refills | Status: DC

## 2024-07-18 ENCOUNTER — Ambulatory Visit: Admitting: Family Medicine

## 2024-07-18 ENCOUNTER — Other Ambulatory Visit: Payer: Self-pay | Admitting: Radiology

## 2024-07-18 ENCOUNTER — Encounter: Payer: Self-pay | Admitting: Family Medicine

## 2024-07-18 VITALS — BP 120/80 | HR 91 | Ht 64.25 in | Wt 171.0 lb

## 2024-07-18 DIAGNOSIS — M75102 Unspecified rotator cuff tear or rupture of left shoulder, not specified as traumatic: Secondary | ICD-10-CM

## 2024-07-18 MED ORDER — METHYLPREDNISOLONE ACETATE 40 MG/ML IJ SUSP
40.0000 mg | Freq: Once | INTRAMUSCULAR | Status: AC
  Administered 2024-07-18: 40 mg via INTRAMUSCULAR

## 2024-07-18 MED ORDER — MELOXICAM 15 MG PO TABS: 15.0000 mg | ORAL_TABLET | Freq: Every day | ORAL | 0 refills | Status: AC

## 2024-07-18 MED ORDER — CYCLOBENZAPRINE HCL 10 MG PO TABS: 10.0000 mg | ORAL_TABLET | Freq: Three times a day (TID) | ORAL | 0 refills | Status: AC | PRN

## 2024-07-18 NOTE — Progress Notes (Signed)
 "    Primary Care / Sports Medicine Office Visit  Patient Information:  Patient ID: Catherine Gilbert, female DOB: August 23, 1975 Age: 49 y.o. MRN: [id number]   Catherine Gilbert is a pleasant 49 y.o. female presenting with the following:  Chief Complaint  Patient presents with   Injections    Cortisone injection for left shoulder. Last injection worse off in Nov 2025    Vitals:   07/18/24 1539  BP: 120/80  Pulse: 91  SpO2: 96%   Vitals:   07/18/24 1539  Weight: 171 lb (77.6 kg)  Height: 5' 4.25 (1.632 m)   Body mass index is 29.12 kg/m.  No results found.   Independent interpretation of notes and tests performed by another provider:   None  Procedures performed:   Procedure:  Injection of left shoulder under ultrasound guidance. Ultrasound guidance utilized for in-plan approach to subacromial space. Samsung HS60 device utilized with permanent recording / reporting. Verbal informed consent obtained and verified. Skin prepped in a sterile fashion. Ethyl chloride for topical local analgesia.  Completed without difficulty and tolerated well. Medication: triamcinolone  acetonide 40 mg/mL suspension for injection 1 mL total and 2 mL lidocaine  1% without epinephrine utilized for needle placement anesthetic Advised to contact for fevers/chills, erythema, induration, drainage, or persistent bleeding. Post-injection improvement readily noted on repeat empty can testing.   Pertinent History, Exam, Impression, and Recommendations:   Discussed the use of AI scribe software for clinical note transcription with the patient, who gave verbal consent to proceed.  History of Present Illness   Catherine Gilbert is a 49 year old female with chronic left rotator cuff tear who presents for evaluation of recurrent left shoulder pain.  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 - No new musculoskeletal symptoms outside of the shoulder and neck  Treatment History for Shoulder Pain: - Cortisone injection to left shoulder on March 22, 2024, provided significant relief for approximately two months before recurrence of symptoms - Prior cortisone injection in April 2025 resulted in about four months of improvement, with pain returning by February 08, 2024 - Attempted to initiate physical therapy but encountered access challenges and concerns regarding cost and insurance copays - Previously tried Celebrex  without benefit - Used meloxicam  and Flexeril  in the past, with Flexeril  prescribed for back pain  Associated Symptoms: - Intermittent dizziness with shoulder movement  Neck Pain: - Neck pain may be contributing to shoulder symptoms  Occupational Factors: - No heavy lifting at current desk job - Anticipates no physical strain at work  Other Medications: - Currently takes sertraline  200 mg daily for mood     Physical Exam  LEFT SHOULDER INSPECTION: No deformity, swelling, erythema, or atrophy. Normal shoulder alignment. PALPATION: Non-tender at the bicipital groove and subacromial space. No crepitus, warmth, effusion, nodules, or bony abnormalities. RANGE OF MOTION: Active and passive range of motion preserved with discomfort at end-range elevation. STRENGTH: External rotation 5/5 strength with associated pain. Internal rotation 5/5 strength, painless. NEUROLOGICAL: Sensation intact to light touch in the left upper extremity; no focal motor deficit. SPECIAL TESTS: Positive empty can test on the left. Positive Neers and Hawkins tests on the left.   Assessment and Plan    Left rotator cuff syndrome Chronic left rotator cuff syndrome with  recurrent symptoms and decreasing relief from corticosteroid injections.  Positive impingement and rotator cuff tests confirm shoulder as primary pain source with likely secondary muscular involvement. Emphasized importance of physical therapy for long-term resolution. - Administered left shoulder corticosteroid injection targeting rotator cuff region. - Referred to [facility] in [place] for individualized shoulder rehabilitation program. - Advised to apply ice and avoid heavy lifting for 1-2 days. - Prescribed meloxicam  1 tablet daily as needed for inflammation. - Prescribed cyclobenzaprine  (Flexeril ) 3 times daily as needed for muscle spasm, recommended to initiate at night and monitor for drowsiness. - Instructed to monitor symptoms, especially at 2 weeks post-injection, and report if pain relief is incomplete or symptoms persist. - Advised to expect contact from physical therapy and to notify if insurance or copay issues arise. - Provided anticipatory guidance regarding expected improvement in pain and function, and discussed potential for long-term resolution with successful physical therapy.  Cervicalgia Possible cervical contribution to left shoulder symptoms due to anatomical and functional relationship. No definitive cervical findings on current examination. Will reassess if symptoms persist after shoulder-directed therapy. Discussed potential need for cervical spine imaging and/or medications for neck pain if shoulder interventions are insufficient. - Discussed potential need for cervical spine MRI and/or medications for neck pain if shoulder-directed interventions do not result in adequate symptom resolution. - Discussed possible medication switch from sertraline  to duloxetine  (Cymbalta ) for combined mood and pain control, pending input from primary care provider. - Messaged primary care provider to coordinate potential medication transition and ensure continuity of mood management during any switch. - Instructed to monitor for any new or  persistent neck symptoms and report if present.        Assessment & Plan Rotator cuff syndrome, left  Orders:   US  LIMITED JOINT SPACE STRUCTURES UP LEFT; Future   methylPREDNISolone  acetate (DEPO-MEDROL ) injection 40 mg   meloxicam  (MOBIC ) 15 MG tablet; Take 1 tablet (15 mg total) by mouth daily.   cyclobenzaprine  (FLEXERIL ) 10 MG tablet; Take 1 tablet (10 mg total) by mouth 3 (three) times daily as needed for muscle spasms.   No follow-ups on file.     Selinda JINNY Ku, MD, [facility]   Primary Care Sports Medicine Primary Care and Sports Medicine at [facility]   "

## 2024-07-18 NOTE — Assessment & Plan Note (Signed)
" °  Orders:   US  LIMITED JOINT SPACE STRUCTURES UP LEFT; Future   methylPREDNISolone  acetate (DEPO-MEDROL ) injection 40 mg   meloxicam  (MOBIC ) 15 MG tablet; Take 1 tablet (15 mg total) by mouth daily.   cyclobenzaprine  (FLEXERIL ) 10 MG tablet; Take 1 tablet (10 mg total) by mouth 3 (three) times daily as needed for muscle spasms.  "

## 2024-07-18 NOTE — Patient Instructions (Signed)
" °  VISIT SUMMARY: Today, we evaluated your recurrent left shoulder pain and discussed your chronic left rotator cuff syndrome. We administered a corticosteroid injection and provided a referral for physical therapy. We also discussed the potential cervical contribution to your shoulder symptoms and the possibility of adjusting your medication for better pain and mood management.  YOUR PLAN: LEFT ROTATOR CUFF SYNDROME: You have chronic left rotator cuff syndrome with recurrent symptoms and decreasing relief from corticosteroid injections. -Administered a corticosteroid injection to your left shoulder. -Referred you to [facility] in [place] for an individualized shoulder rehabilitation program. -Apply ice and avoid heavy lifting for 1-2 days. -Take meloxicam  1 tablet daily as needed for inflammation. -Take cyclobenzaprine  (Flexeril ) 3 times daily as needed for muscle spasm. Start at night and monitor for drowsiness. -Monitor your symptoms, especially at 2 weeks post-injection, and report if pain relief is incomplete or symptoms persist. -Expect contact from physical therapy and notify us  if insurance or copay issues arise. -With successful physical therapy, you can expect improvement in pain and function, and potentially long-term resolution.  CERVICALGIA: Your neck pain may be contributing to your shoulder symptoms. -We will reassess if symptoms persist after shoulder-directed therapy. -Discussed the potential need for a cervical spine MRI and/or medications for neck pain if shoulder interventions are insufficient. -Discussed the possibility of switching from sertraline  to duloxetine  (Cymbalta ) for combined mood and pain control, pending input from your primary care provider. -Messaged your primary care provider to coordinate a potential medication transition and ensure continuity of mood management during any switch. -Monitor for any new or persistent neck symptoms and report if  present.    Contains text generated by [employer].   "

## 2024-07-21 ENCOUNTER — Other Ambulatory Visit: Payer: Self-pay | Admitting: Family Medicine

## 2024-07-21 ENCOUNTER — Encounter: Payer: Self-pay | Admitting: Family Medicine

## 2024-07-21 MED ORDER — SERTRALINE HCL 50 MG PO TABS: ORAL_TABLET | ORAL | 0 refills | Status: AC

## 2024-07-21 MED ORDER — DULOXETINE HCL 30 MG PO CPEP: ORAL_CAPSULE | ORAL | 0 refills | Status: AC

## 2024-07-23 ENCOUNTER — Other Ambulatory Visit: Payer: Self-pay | Admitting: Family Medicine

## 2024-07-23 DIAGNOSIS — M75102 Unspecified rotator cuff tear or rupture of left shoulder, not specified as traumatic: Secondary | ICD-10-CM

## 2024-07-25 NOTE — Telephone Encounter (Signed)
 Requested medication (s) are due for refill today- no  Requested medication (s) are on the active medication list -yes  Future visit scheduled -no  Last refill: 07/18/24  #30  Notes to clinic: speciality provider patient- request sent for review    Requested Prescriptions  Pending Prescriptions Disp Refills   meloxicam  (MOBIC ) 15 MG tablet [Pharmacy Med Name: MELOXICAM  15 MG TAB] 30 tablet 0    Sig: TAKE 1 TABLET BY MOUTH ONCE DAILY FOR 2 WEEKS THEN AS NEEDED. TAKE WITH FOOD     Analgesics:  COX2 Inhibitors Failed - 07/25/2024  1:10 PM      Failed - Manual Review: Labs are only required if the patient has taken medication for more than 8 weeks.      Passed - HGB in normal range and within 360 days    Hemoglobin  Date Value Ref Range Status  12/28/2023 12.8 12.0 - 15.0 g/dL Final  88/87/7975 87.9 12.0 - 15.0 g/dL Final  88/83/7977 87.4 11.1 - 15.9 g/dL Final         Passed - Cr in normal range and within 360 days    Creatinine  Date Value Ref Range Status  05/12/2023 0.93 0.44 - 1.00 mg/dL Final   Creat  Date Value Ref Range Status  02/14/2019 0.74 0.50 - 1.10 mg/dL Final   Creatinine, Ser  Date Value Ref Range Status  12/28/2023 0.89 0.40 - 1.20 mg/dL Final         Passed - HCT in normal range and within 360 days    HCT  Date Value Ref Range Status  12/28/2023 37.9 36.0 - 46.0 % Final   Hematocrit  Date Value Ref Range Status  05/15/2021 36.5 34.0 - 46.6 % Final         Passed - AST in normal range and within 360 days    AST  Date Value Ref Range Status  12/28/2023 22 0 - 37 U/L Final  05/12/2023 23 15 - 41 U/L Final         Passed - ALT in normal range and within 360 days    ALT  Date Value Ref Range Status  12/28/2023 25 0 - 35 U/L Final  05/12/2023 22 0 - 44 U/L Final         Passed - eGFR is 30 or above and within 360 days    GFR calc Af Amer  Date Value Ref Range Status  03/30/2018 >60 >60 mL/min Final    Comment:    (NOTE) The eGFR has been  calculated using the CKD EPI equation. This calculation has not been validated in all clinical situations. eGFR's persistently <60 mL/min signify possible Chronic Kidney Disease.    GFR, Estimated  Date Value Ref Range Status  05/12/2023 >60 >60 mL/min Final    Comment:    (NOTE) Calculated using the CKD-EPI Creatinine Equation (2021)    GFR  Date Value Ref Range Status  12/28/2023 76.54 >60.00 mL/min Final    Comment:    Calculated using the CKD-EPI Creatinine Equation (2021)         Passed - Patient is not pregnant      Passed - Valid encounter within last 12 months    Recent Outpatient Visits           1 week ago Rotator cuff syndrome, left   [place] [facility] at [facility], Selinda PARAS, MD   4 months ago Rotator cuff syndrome,  left   [facility] Primary Care & Sports Medicine at [facility], Selinda PARAS, MD   5 months ago Rotator cuff syndrome, left   [facility] & [facility] Medicine at [facility], Selinda PARAS, MD   9 months ago Rotator cuff syndrome, left   [facility] Primary Care & Sports Medicine at [facility], Selinda PARAS, MD                 Requested Prescriptions  Pending Prescriptions Disp Refills   meloxicam  (MOBIC ) 15 MG tablet [Pharmacy Med Name: MELOXICAM  15 MG TAB] 30 tablet 0    Sig: TAKE 1 TABLET BY MOUTH ONCE DAILY FOR 2 WEEKS THEN AS NEEDED. TAKE WITH FOOD     Analgesics:  COX2 Inhibitors Failed - 07/25/2024  1:10 PM      Failed - Manual Review: Labs are only required if the patient has taken medication for more than 8 weeks.      Passed - HGB in normal range and within 360 days    Hemoglobin  Date Value Ref Range Status  12/28/2023 12.8 12.0 - 15.0 g/dL Final  88/87/7975 87.9 12.0 - 15.0 g/dL Final  88/83/7977 87.4 11.1 - 15.9 g/dL Final         Passed - Cr in normal range and within 360 days    Creatinine  Date Value Ref Range Status  05/12/2023 0.93  0.44 - 1.00 mg/dL Final   Creat  Date Value Ref Range Status  02/14/2019 0.74 0.50 - 1.10 mg/dL Final   Creatinine, Ser  Date Value Ref Range Status  12/28/2023 0.89 0.40 - 1.20 mg/dL Final         Passed - HCT in normal range and within 360 days    HCT  Date Value Ref Range Status  12/28/2023 37.9 36.0 - 46.0 % Final   Hematocrit  Date Value Ref Range Status  05/15/2021 36.5 34.0 - 46.6 % Final         Passed - AST in normal range and within 360 days    AST  Date Value Ref Range Status  12/28/2023 22 0 - 37 U/L Final  05/12/2023 23 15 - 41 U/L Final         Passed - ALT in normal range and within 360 days    ALT  Date Value Ref Range Status  12/28/2023 25 0 - 35 U/L Final  05/12/2023 22 0 - 44 U/L Final         Passed - eGFR is 30 or above and within 360 days    GFR calc Af Amer  Date Value Ref Range Status  03/30/2018 >60 >60 mL/min Final    Comment:    (NOTE) The eGFR has been calculated using the CKD EPI equation. This calculation has not been validated in all clinical situations. eGFR's persistently <60 mL/min signify possible Chronic Kidney Disease.    GFR, Estimated  Date Value Ref Range Status  05/12/2023 >60 >60 mL/min Final    Comment:    (NOTE) Calculated using the CKD-EPI Creatinine Equation (2021)    GFR  Date Value Ref Range Status  12/28/2023 76.54 >60.00 mL/min Final    Comment:    Calculated using the CKD-EPI Creatinine Equation (2021)         Passed - Patient is not pregnant      Passed - Valid encounter within last 12 months    Recent Outpatient Visits  1 week ago Rotator cuff syndrome, left   [place] [facility], Selinda PARAS, MD   4 months ago Rotator cuff syndrome, left   [facility] Medicine at [facility], Selinda PARAS, MD   5 months ago Rotator cuff syndrome, left   [facility] Primary Care & Sports Medicine at [facility], Selinda PARAS, MD   9 months ago Rotator cuff syndrome, left   [facility] & Sports Medicine at [facility], Selinda PARAS, MD
